# Patient Record
Sex: Female | Born: 1940
Health system: Southern US, Community
[De-identification: ages and names within clinical notes are randomized; demographics above are authoritative.]

## PROBLEM LIST (undated history)

## (undated) DIAGNOSIS — M810 Age-related osteoporosis without current pathological fracture: Secondary | ICD-10-CM

## (undated) DIAGNOSIS — I10 Essential (primary) hypertension: Secondary | ICD-10-CM

## (undated) DIAGNOSIS — F32A Depression, unspecified: Secondary | ICD-10-CM

## (undated) DIAGNOSIS — M1990 Unspecified osteoarthritis, unspecified site: Secondary | ICD-10-CM

## (undated) DIAGNOSIS — H409 Unspecified glaucoma: Secondary | ICD-10-CM

## (undated) DIAGNOSIS — M65312 Trigger thumb, left thumb: Secondary | ICD-10-CM

## (undated) DIAGNOSIS — M25511 Pain in right shoulder: Secondary | ICD-10-CM

## (undated) DIAGNOSIS — E785 Hyperlipidemia, unspecified: Secondary | ICD-10-CM

## (undated) DIAGNOSIS — R5382 Chronic fatigue, unspecified: Secondary | ICD-10-CM

## (undated) DIAGNOSIS — R252 Cramp and spasm: Secondary | ICD-10-CM

## (undated) DIAGNOSIS — R600 Localized edema: Secondary | ICD-10-CM

## (undated) DIAGNOSIS — H269 Unspecified cataract: Secondary | ICD-10-CM

## (undated) DIAGNOSIS — N189 Chronic kidney disease, unspecified: Secondary | ICD-10-CM

## (undated) DIAGNOSIS — F329 Major depressive disorder, single episode, unspecified: Secondary | ICD-10-CM

## (undated) DIAGNOSIS — R7303 Prediabetes: Secondary | ICD-10-CM

## (undated) DIAGNOSIS — G3184 Mild cognitive impairment, so stated: Secondary | ICD-10-CM

## (undated) DIAGNOSIS — A048 Other specified bacterial intestinal infections: Secondary | ICD-10-CM

## (undated) HISTORY — DX: Unspecified cataract: H26.9

## (undated) HISTORY — DX: Chronic fatigue, unspecified: R53.82

## (undated) HISTORY — DX: Depression, unspecified: F32.A

## (undated) HISTORY — DX: Chronic kidney disease, unspecified: N18.9

## (undated) HISTORY — DX: Hyperlipidemia, unspecified: E78.5

## (undated) HISTORY — DX: Trigger thumb, left thumb: M65.312

## (undated) HISTORY — PX: ABDOMINAL HYSTERECTOMY: SHX81

## (undated) HISTORY — DX: Essential (primary) hypertension: I10

## (undated) HISTORY — PX: BLADDER SURGERY: SHX569

## (undated) HISTORY — DX: Major depressive disorder, single episode, unspecified: F32.9

## (undated) HISTORY — DX: Age-related osteoporosis without current pathological fracture: M81.0

## (undated) HISTORY — PX: COLON SURGERY: SHX602

## (undated) HISTORY — DX: Prediabetes: R73.03

## (undated) HISTORY — DX: Mild cognitive impairment, so stated: G31.84

## (undated) HISTORY — DX: Unspecified osteoarthritis, unspecified site: M19.90

## (undated) HISTORY — DX: Unspecified glaucoma: H40.9

## (undated) HISTORY — PX: OTHER SURGICAL HISTORY: SHX169

---
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
## 1996-10-07 ENCOUNTER — Encounter (INDEPENDENT_AMBULATORY_CARE_PROVIDER_SITE_OTHER): Payer: Self-pay | Admitting: *Deleted

## 1998-06-30 ENCOUNTER — Ambulatory Visit (HOSPITAL_COMMUNITY): Admission: RE | Admit: 1998-06-30 | Discharge: 1998-06-30 | Payer: Self-pay | Admitting: Infectious Diseases

## 1998-06-30 ENCOUNTER — Encounter: Payer: Self-pay | Admitting: Infectious Diseases

## 1998-09-19 ENCOUNTER — Encounter: Admission: RE | Admit: 1998-09-19 | Discharge: 1998-09-19 | Payer: Self-pay | Admitting: Family Medicine

## 1998-10-09 ENCOUNTER — Encounter: Admission: RE | Admit: 1998-10-09 | Discharge: 1998-10-09 | Payer: Self-pay | Admitting: Family Medicine

## 1998-11-14 ENCOUNTER — Encounter: Admission: RE | Admit: 1998-11-14 | Discharge: 1998-11-14 | Payer: Self-pay | Admitting: Family Medicine

## 1998-11-21 ENCOUNTER — Encounter: Admission: RE | Admit: 1998-11-21 | Discharge: 1998-11-21 | Payer: Self-pay | Admitting: Family Medicine

## 1999-01-21 ENCOUNTER — Encounter: Admission: RE | Admit: 1999-01-21 | Discharge: 1999-01-21 | Payer: Self-pay | Admitting: Family Medicine

## 1999-04-22 ENCOUNTER — Encounter: Admission: RE | Admit: 1999-04-22 | Discharge: 1999-04-22 | Payer: Self-pay | Admitting: Family Medicine

## 1999-05-25 ENCOUNTER — Encounter: Admission: RE | Admit: 1999-05-25 | Discharge: 1999-05-25 | Payer: Self-pay | Admitting: Family Medicine

## 1999-06-03 ENCOUNTER — Encounter: Admission: RE | Admit: 1999-06-03 | Discharge: 1999-06-03 | Payer: Self-pay | Admitting: Family Medicine

## 1999-06-12 ENCOUNTER — Encounter: Admission: RE | Admit: 1999-06-12 | Discharge: 1999-06-12 | Payer: Self-pay | Admitting: Family Medicine

## 1999-06-15 ENCOUNTER — Encounter: Admission: RE | Admit: 1999-06-15 | Discharge: 1999-06-15 | Payer: Self-pay | Admitting: Family Medicine

## 1999-07-13 ENCOUNTER — Encounter: Admission: RE | Admit: 1999-07-13 | Discharge: 1999-07-13 | Payer: Self-pay | Admitting: Family Medicine

## 1999-08-04 ENCOUNTER — Encounter: Admission: RE | Admit: 1999-08-04 | Discharge: 1999-08-04 | Payer: Self-pay | Admitting: Family Medicine

## 1999-08-18 ENCOUNTER — Encounter: Admission: RE | Admit: 1999-08-18 | Discharge: 1999-08-18 | Payer: Self-pay | Admitting: Sports Medicine

## 1999-09-02 ENCOUNTER — Encounter: Admission: RE | Admit: 1999-09-02 | Discharge: 1999-09-02 | Payer: Self-pay | Admitting: Family Medicine

## 1999-09-03 ENCOUNTER — Encounter: Payer: Self-pay | Admitting: Sports Medicine

## 1999-09-03 ENCOUNTER — Encounter: Admission: RE | Admit: 1999-09-03 | Discharge: 1999-09-03 | Payer: Self-pay | Admitting: Sports Medicine

## 1999-09-22 ENCOUNTER — Encounter: Admission: RE | Admit: 1999-09-22 | Discharge: 1999-09-22 | Payer: Self-pay | Admitting: Sports Medicine

## 1999-09-23 ENCOUNTER — Encounter: Admission: RE | Admit: 1999-09-23 | Discharge: 1999-09-23 | Payer: Self-pay | Admitting: Sports Medicine

## 1999-09-23 ENCOUNTER — Encounter: Payer: Self-pay | Admitting: Sports Medicine

## 1999-10-14 ENCOUNTER — Encounter: Payer: Self-pay | Admitting: *Deleted

## 1999-10-14 ENCOUNTER — Encounter: Admission: RE | Admit: 1999-10-14 | Discharge: 1999-10-14 | Payer: Self-pay | Admitting: *Deleted

## 2000-03-16 ENCOUNTER — Encounter: Admission: RE | Admit: 2000-03-16 | Discharge: 2000-03-16 | Payer: Self-pay | Admitting: *Deleted

## 2000-03-16 ENCOUNTER — Encounter: Admission: RE | Admit: 2000-03-16 | Discharge: 2000-03-16 | Payer: Self-pay | Admitting: Family Medicine

## 2000-03-16 ENCOUNTER — Encounter: Payer: Self-pay | Admitting: *Deleted

## 2000-04-01 ENCOUNTER — Encounter: Admission: RE | Admit: 2000-04-01 | Discharge: 2000-04-01 | Payer: Self-pay | Admitting: Orthopedic Surgery

## 2000-04-01 ENCOUNTER — Encounter: Payer: Self-pay | Admitting: Orthopedic Surgery

## 2000-04-24 ENCOUNTER — Emergency Department (HOSPITAL_COMMUNITY): Admission: EM | Admit: 2000-04-24 | Discharge: 2000-04-24 | Payer: Self-pay | Admitting: Emergency Medicine

## 2000-05-09 ENCOUNTER — Encounter: Admission: RE | Admit: 2000-05-09 | Discharge: 2000-05-09 | Payer: Self-pay | Admitting: Sports Medicine

## 2000-05-11 ENCOUNTER — Encounter: Payer: Self-pay | Admitting: *Deleted

## 2000-05-11 ENCOUNTER — Encounter: Admission: RE | Admit: 2000-05-11 | Discharge: 2000-05-11 | Payer: Self-pay | Admitting: *Deleted

## 2000-08-23 ENCOUNTER — Encounter: Admission: RE | Admit: 2000-08-23 | Discharge: 2000-08-23 | Payer: Self-pay | Admitting: Sports Medicine

## 2000-08-26 ENCOUNTER — Encounter: Admission: RE | Admit: 2000-08-26 | Discharge: 2000-08-26 | Payer: Self-pay | Admitting: Family Medicine

## 2000-09-01 ENCOUNTER — Encounter: Admission: RE | Admit: 2000-09-01 | Discharge: 2000-09-01 | Payer: Self-pay | Admitting: Family Medicine

## 2000-09-29 ENCOUNTER — Encounter: Admission: RE | Admit: 2000-09-29 | Discharge: 2000-09-29 | Payer: Self-pay | Admitting: Family Medicine

## 2000-11-25 ENCOUNTER — Encounter: Admission: RE | Admit: 2000-11-25 | Discharge: 2000-11-25 | Payer: Self-pay | Admitting: Family Medicine

## 2000-12-06 ENCOUNTER — Encounter: Payer: Self-pay | Admitting: Sports Medicine

## 2000-12-06 ENCOUNTER — Encounter: Admission: RE | Admit: 2000-12-06 | Discharge: 2000-12-06 | Payer: Self-pay | Admitting: Sports Medicine

## 2000-12-28 ENCOUNTER — Encounter: Admission: RE | Admit: 2000-12-28 | Discharge: 2000-12-28 | Payer: Self-pay | Admitting: Family Medicine

## 2001-01-03 ENCOUNTER — Encounter: Admission: RE | Admit: 2001-01-03 | Discharge: 2001-01-03 | Payer: Self-pay | Admitting: Sports Medicine

## 2001-02-02 ENCOUNTER — Encounter: Admission: RE | Admit: 2001-02-02 | Discharge: 2001-02-02 | Payer: Self-pay | Admitting: Family Medicine

## 2001-02-10 ENCOUNTER — Encounter: Admission: RE | Admit: 2001-02-10 | Discharge: 2001-02-10 | Payer: Self-pay | Admitting: Family Medicine

## 2001-03-14 ENCOUNTER — Encounter: Admission: RE | Admit: 2001-03-14 | Discharge: 2001-03-14 | Payer: Self-pay | Admitting: Family Medicine

## 2001-04-11 ENCOUNTER — Encounter: Admission: RE | Admit: 2001-04-11 | Discharge: 2001-04-11 | Payer: Self-pay | Admitting: Sports Medicine

## 2001-04-24 ENCOUNTER — Encounter: Admission: RE | Admit: 2001-04-24 | Discharge: 2001-04-24 | Payer: Self-pay | Admitting: Family Medicine

## 2001-05-08 ENCOUNTER — Encounter: Admission: RE | Admit: 2001-05-08 | Discharge: 2001-05-08 | Payer: Self-pay | Admitting: Family Medicine

## 2001-05-25 ENCOUNTER — Encounter: Admission: RE | Admit: 2001-05-25 | Discharge: 2001-05-25 | Payer: Self-pay | Admitting: Family Medicine

## 2001-06-07 ENCOUNTER — Encounter: Admission: RE | Admit: 2001-06-07 | Discharge: 2001-06-07 | Payer: Self-pay | Admitting: Family Medicine

## 2001-06-27 ENCOUNTER — Encounter: Admission: RE | Admit: 2001-06-27 | Discharge: 2001-06-27 | Payer: Self-pay | Admitting: Family Medicine

## 2001-07-27 ENCOUNTER — Encounter: Admission: RE | Admit: 2001-07-27 | Discharge: 2001-07-27 | Payer: Self-pay | Admitting: Family Medicine

## 2001-08-28 ENCOUNTER — Encounter: Admission: RE | Admit: 2001-08-28 | Discharge: 2001-08-28 | Payer: Self-pay | Admitting: Family Medicine

## 2001-09-20 ENCOUNTER — Encounter: Admission: RE | Admit: 2001-09-20 | Discharge: 2001-09-20 | Payer: Self-pay | Admitting: Family Medicine

## 2001-10-02 ENCOUNTER — Encounter: Admission: RE | Admit: 2001-10-02 | Discharge: 2001-10-02 | Payer: Self-pay | Admitting: Family Medicine

## 2001-10-12 ENCOUNTER — Encounter: Admission: RE | Admit: 2001-10-12 | Discharge: 2001-10-12 | Payer: Self-pay | Admitting: Family Medicine

## 2001-10-20 ENCOUNTER — Encounter: Admission: RE | Admit: 2001-10-20 | Discharge: 2001-10-20 | Payer: Self-pay | Admitting: Family Medicine

## 2001-10-24 ENCOUNTER — Encounter: Payer: Self-pay | Admitting: Orthopedic Surgery

## 2001-10-26 ENCOUNTER — Ambulatory Visit (HOSPITAL_COMMUNITY): Admission: RE | Admit: 2001-10-26 | Discharge: 2001-10-26 | Payer: Self-pay | Admitting: Orthopedic Surgery

## 2001-11-20 ENCOUNTER — Encounter: Admission: RE | Admit: 2001-11-20 | Discharge: 2001-11-20 | Payer: Self-pay | Admitting: Family Medicine

## 2002-01-02 ENCOUNTER — Encounter: Admission: RE | Admit: 2002-01-02 | Discharge: 2002-01-02 | Payer: Self-pay | Admitting: Sports Medicine

## 2002-01-31 ENCOUNTER — Encounter: Admission: RE | Admit: 2002-01-31 | Discharge: 2002-01-31 | Payer: Self-pay | Admitting: Family Medicine

## 2002-04-05 ENCOUNTER — Encounter: Admission: RE | Admit: 2002-04-05 | Discharge: 2002-04-05 | Payer: Self-pay | Admitting: Family Medicine

## 2002-05-16 ENCOUNTER — Encounter: Admission: RE | Admit: 2002-05-16 | Discharge: 2002-05-16 | Payer: Self-pay | Admitting: Family Medicine

## 2002-05-30 ENCOUNTER — Encounter: Admission: RE | Admit: 2002-05-30 | Discharge: 2002-05-30 | Payer: Self-pay | Admitting: Family Medicine

## 2002-06-08 ENCOUNTER — Encounter: Admission: RE | Admit: 2002-06-08 | Discharge: 2002-06-08 | Payer: Self-pay | Admitting: Family Medicine

## 2002-06-12 ENCOUNTER — Encounter: Payer: Self-pay | Admitting: *Deleted

## 2002-06-12 ENCOUNTER — Encounter: Admission: RE | Admit: 2002-06-12 | Discharge: 2002-06-12 | Payer: Self-pay | Admitting: *Deleted

## 2002-06-18 ENCOUNTER — Encounter: Admission: RE | Admit: 2002-06-18 | Discharge: 2002-06-18 | Payer: Self-pay | Admitting: Family Medicine

## 2002-06-26 ENCOUNTER — Encounter: Admission: RE | Admit: 2002-06-26 | Discharge: 2002-09-24 | Payer: Self-pay | Admitting: Sports Medicine

## 2002-07-20 ENCOUNTER — Encounter: Admission: RE | Admit: 2002-07-20 | Discharge: 2002-07-20 | Payer: Self-pay | Admitting: Family Medicine

## 2002-09-18 ENCOUNTER — Encounter: Admission: RE | Admit: 2002-09-18 | Discharge: 2002-09-18 | Payer: Self-pay | Admitting: Family Medicine

## 2002-09-20 ENCOUNTER — Encounter: Admission: RE | Admit: 2002-09-20 | Discharge: 2002-09-20 | Payer: Self-pay | Admitting: Sports Medicine

## 2002-09-20 ENCOUNTER — Encounter: Payer: Self-pay | Admitting: Sports Medicine

## 2002-10-11 ENCOUNTER — Encounter: Admission: RE | Admit: 2002-10-11 | Discharge: 2002-10-11 | Payer: Self-pay | Admitting: Family Medicine

## 2002-10-23 ENCOUNTER — Encounter: Admission: RE | Admit: 2002-10-23 | Discharge: 2002-10-23 | Payer: Self-pay | Admitting: Family Medicine

## 2002-11-12 ENCOUNTER — Encounter: Admission: RE | Admit: 2002-11-12 | Discharge: 2002-11-12 | Payer: Self-pay | Admitting: Family Medicine

## 2002-11-15 ENCOUNTER — Encounter: Admission: RE | Admit: 2002-11-15 | Discharge: 2002-11-15 | Payer: Self-pay | Admitting: Family Medicine

## 2002-11-22 ENCOUNTER — Encounter: Admission: RE | Admit: 2002-11-22 | Discharge: 2002-11-22 | Payer: Self-pay | Admitting: Sports Medicine

## 2002-12-06 ENCOUNTER — Encounter: Admission: RE | Admit: 2002-12-06 | Discharge: 2002-12-06 | Payer: Self-pay | Admitting: Family Medicine

## 2002-12-06 ENCOUNTER — Ambulatory Visit (HOSPITAL_COMMUNITY): Admission: RE | Admit: 2002-12-06 | Discharge: 2002-12-06 | Payer: Self-pay | Admitting: Family Medicine

## 2002-12-19 ENCOUNTER — Encounter: Admission: RE | Admit: 2002-12-19 | Discharge: 2002-12-19 | Payer: Self-pay | Admitting: Sports Medicine

## 2002-12-19 ENCOUNTER — Encounter: Payer: Self-pay | Admitting: Sports Medicine

## 2002-12-19 ENCOUNTER — Encounter: Admission: RE | Admit: 2002-12-19 | Discharge: 2002-12-19 | Payer: Self-pay | Admitting: Family Medicine

## 2003-01-04 ENCOUNTER — Encounter: Admission: RE | Admit: 2003-01-04 | Discharge: 2003-01-04 | Payer: Self-pay | Admitting: Family Medicine

## 2003-01-21 ENCOUNTER — Encounter: Admission: RE | Admit: 2003-01-21 | Discharge: 2003-01-21 | Payer: Self-pay | Admitting: Family Medicine

## 2003-04-10 ENCOUNTER — Encounter: Admission: RE | Admit: 2003-04-10 | Discharge: 2003-04-10 | Payer: Self-pay | Admitting: Family Medicine

## 2003-05-20 ENCOUNTER — Encounter: Admission: RE | Admit: 2003-05-20 | Discharge: 2003-05-20 | Payer: Self-pay | Admitting: Family Medicine

## 2003-06-10 ENCOUNTER — Encounter: Admission: RE | Admit: 2003-06-10 | Discharge: 2003-06-10 | Payer: Self-pay | Admitting: Family Medicine

## 2003-10-23 ENCOUNTER — Encounter: Admission: RE | Admit: 2003-10-23 | Discharge: 2003-10-23 | Payer: Self-pay | Admitting: Family Medicine

## 2004-03-12 ENCOUNTER — Encounter: Admission: RE | Admit: 2004-03-12 | Discharge: 2004-03-12 | Payer: Self-pay | Admitting: Family Medicine

## 2004-08-20 ENCOUNTER — Ambulatory Visit: Payer: Self-pay | Admitting: Family Medicine

## 2004-12-24 ENCOUNTER — Ambulatory Visit: Payer: Self-pay | Admitting: Family Medicine

## 2005-06-03 ENCOUNTER — Ambulatory Visit: Payer: Self-pay | Admitting: Family Medicine

## 2005-06-17 ENCOUNTER — Ambulatory Visit: Payer: Self-pay | Admitting: Family Medicine

## 2005-06-29 ENCOUNTER — Ambulatory Visit (HOSPITAL_COMMUNITY): Admission: RE | Admit: 2005-06-29 | Discharge: 2005-06-29 | Payer: Self-pay | Admitting: Family Medicine

## 2005-08-18 ENCOUNTER — Ambulatory Visit: Payer: Self-pay | Admitting: Family Medicine

## 2005-09-06 ENCOUNTER — Ambulatory Visit: Payer: Self-pay | Admitting: Family Medicine

## 2005-09-16 ENCOUNTER — Ambulatory Visit: Payer: Self-pay | Admitting: Family Medicine

## 2005-10-18 ENCOUNTER — Ambulatory Visit: Payer: Self-pay | Admitting: Sports Medicine

## 2005-12-15 ENCOUNTER — Ambulatory Visit: Payer: Self-pay | Admitting: Sports Medicine

## 2006-01-14 ENCOUNTER — Ambulatory Visit: Payer: Self-pay | Admitting: Family Medicine

## 2006-05-03 ENCOUNTER — Ambulatory Visit: Payer: Self-pay | Admitting: Family Medicine

## 2006-06-14 ENCOUNTER — Ambulatory Visit: Payer: Self-pay | Admitting: Family Medicine

## 2006-06-28 ENCOUNTER — Ambulatory Visit: Payer: Self-pay

## 2006-07-05 ENCOUNTER — Ambulatory Visit (HOSPITAL_COMMUNITY): Admission: RE | Admit: 2006-07-05 | Discharge: 2006-07-05 | Payer: Self-pay | Admitting: Family Medicine

## 2006-08-05 ENCOUNTER — Ambulatory Visit: Payer: Self-pay | Admitting: Family Medicine

## 2006-10-06 DIAGNOSIS — F3289 Other specified depressive episodes: Secondary | ICD-10-CM | POA: Insufficient documentation

## 2006-10-06 DIAGNOSIS — I10 Essential (primary) hypertension: Secondary | ICD-10-CM | POA: Insufficient documentation

## 2006-10-06 DIAGNOSIS — N951 Menopausal and female climacteric states: Secondary | ICD-10-CM | POA: Insufficient documentation

## 2006-10-06 DIAGNOSIS — E669 Obesity, unspecified: Secondary | ICD-10-CM

## 2006-10-06 DIAGNOSIS — F329 Major depressive disorder, single episode, unspecified: Secondary | ICD-10-CM

## 2006-10-07 ENCOUNTER — Encounter (INDEPENDENT_AMBULATORY_CARE_PROVIDER_SITE_OTHER): Payer: Self-pay | Admitting: *Deleted

## 2006-11-17 ENCOUNTER — Ambulatory Visit: Payer: Self-pay | Admitting: Family Medicine

## 2006-11-17 DIAGNOSIS — E7800 Pure hypercholesterolemia, unspecified: Secondary | ICD-10-CM

## 2006-11-18 ENCOUNTER — Telehealth (INDEPENDENT_AMBULATORY_CARE_PROVIDER_SITE_OTHER): Payer: Self-pay | Admitting: Family Medicine

## 2007-01-18 ENCOUNTER — Telehealth (INDEPENDENT_AMBULATORY_CARE_PROVIDER_SITE_OTHER): Payer: Self-pay | Admitting: Family Medicine

## 2007-01-26 ENCOUNTER — Encounter: Payer: Self-pay | Admitting: Family Medicine

## 2007-02-01 ENCOUNTER — Telehealth (INDEPENDENT_AMBULATORY_CARE_PROVIDER_SITE_OTHER): Payer: Self-pay | Admitting: Family Medicine

## 2007-02-14 ENCOUNTER — Telehealth: Payer: Self-pay | Admitting: *Deleted

## 2007-06-09 ENCOUNTER — Encounter (INDEPENDENT_AMBULATORY_CARE_PROVIDER_SITE_OTHER): Payer: Self-pay | Admitting: *Deleted

## 2007-06-09 ENCOUNTER — Encounter (INDEPENDENT_AMBULATORY_CARE_PROVIDER_SITE_OTHER): Payer: Self-pay | Admitting: Family Medicine

## 2007-06-09 ENCOUNTER — Ambulatory Visit: Payer: Self-pay | Admitting: Family Medicine

## 2007-06-09 LAB — CONVERTED CEMR LAB
ALT: 13 units/L (ref 0–35)
CO2: 27 meq/L (ref 19–32)
Cholesterol: 161 mg/dL (ref 0–200)
Creatinine, Ser: 1.06 mg/dL (ref 0.40–1.20)
HCT: 38.1 % (ref 36.0–46.0)
MCV: 96.9 fL (ref 78.0–100.0)
Platelets: 305 10*3/uL (ref 150–400)
RDW: 15.1 % — ABNORMAL HIGH (ref 11.5–14.0)
Total Bilirubin: 0.4 mg/dL (ref 0.3–1.2)
Total CHOL/HDL Ratio: 3.4
VLDL: 19 mg/dL (ref 0–40)

## 2007-06-11 ENCOUNTER — Encounter (INDEPENDENT_AMBULATORY_CARE_PROVIDER_SITE_OTHER): Payer: Self-pay | Admitting: Family Medicine

## 2007-06-22 ENCOUNTER — Telehealth: Payer: Self-pay | Admitting: *Deleted

## 2007-06-30 ENCOUNTER — Telehealth: Payer: Self-pay | Admitting: *Deleted

## 2007-07-19 ENCOUNTER — Ambulatory Visit (HOSPITAL_COMMUNITY): Admission: RE | Admit: 2007-07-19 | Discharge: 2007-07-19 | Payer: Self-pay | Admitting: Advanced Practice Midwife

## 2007-08-17 ENCOUNTER — Ambulatory Visit: Payer: Self-pay | Admitting: Family Medicine

## 2007-08-30 ENCOUNTER — Telehealth: Payer: Self-pay | Admitting: *Deleted

## 2007-09-06 ENCOUNTER — Encounter: Payer: Self-pay | Admitting: *Deleted

## 2007-09-11 ENCOUNTER — Ambulatory Visit: Payer: Self-pay | Admitting: Family Medicine

## 2007-09-20 ENCOUNTER — Telehealth: Payer: Self-pay | Admitting: *Deleted

## 2007-11-15 ENCOUNTER — Ambulatory Visit: Payer: Self-pay | Admitting: Sports Medicine

## 2007-12-12 ENCOUNTER — Telehealth: Payer: Self-pay | Admitting: *Deleted

## 2008-03-08 ENCOUNTER — Ambulatory Visit: Payer: Self-pay | Admitting: Family Medicine

## 2008-03-12 ENCOUNTER — Telehealth (INDEPENDENT_AMBULATORY_CARE_PROVIDER_SITE_OTHER): Payer: Self-pay | Admitting: Family Medicine

## 2008-05-13 ENCOUNTER — Encounter (INDEPENDENT_AMBULATORY_CARE_PROVIDER_SITE_OTHER): Payer: Self-pay | Admitting: Family Medicine

## 2008-05-31 ENCOUNTER — Ambulatory Visit: Payer: Self-pay | Admitting: Family Medicine

## 2008-06-03 ENCOUNTER — Telehealth: Payer: Self-pay | Admitting: *Deleted

## 2008-06-03 ENCOUNTER — Telehealth (INDEPENDENT_AMBULATORY_CARE_PROVIDER_SITE_OTHER): Payer: Self-pay | Admitting: Family Medicine

## 2008-07-24 ENCOUNTER — Ambulatory Visit (HOSPITAL_COMMUNITY): Admission: RE | Admit: 2008-07-24 | Discharge: 2008-07-24 | Payer: Self-pay | Admitting: Family Medicine

## 2008-08-09 HISTORY — PX: OTHER SURGICAL HISTORY: SHX169

## 2008-12-05 ENCOUNTER — Encounter (INDEPENDENT_AMBULATORY_CARE_PROVIDER_SITE_OTHER): Payer: Self-pay | Admitting: Family Medicine

## 2008-12-05 ENCOUNTER — Ambulatory Visit: Payer: Self-pay | Admitting: Family Medicine

## 2008-12-05 LAB — CONVERTED CEMR LAB
Alkaline Phosphatase: 60 units/L (ref 39–117)
BUN: 17 mg/dL (ref 6–23)
Creatinine, Ser: 1.1 mg/dL (ref 0.40–1.20)
Glucose, Bld: 103 mg/dL — ABNORMAL HIGH (ref 70–99)
HDL: 43 mg/dL (ref >39)
LDL Cholesterol: 69 mg/dL (ref 0–99)
Sodium: 142 meq/L (ref 135–145)
Total Bilirubin: 0.2 mg/dL — ABNORMAL LOW (ref 0.3–1.2)
Total CHOL/HDL Ratio: 3.2
Total Protein: 7 g/dL (ref 6.0–8.3)
Triglycerides: 127 mg/dL (ref <150)
VLDL: 25 mg/dL (ref 0–40)

## 2008-12-09 ENCOUNTER — Encounter (INDEPENDENT_AMBULATORY_CARE_PROVIDER_SITE_OTHER): Payer: Self-pay | Admitting: Family Medicine

## 2009-01-27 ENCOUNTER — Telehealth (INDEPENDENT_AMBULATORY_CARE_PROVIDER_SITE_OTHER): Payer: Self-pay | Admitting: Family Medicine

## 2009-04-22 ENCOUNTER — Telehealth: Payer: Self-pay | Admitting: Family Medicine

## 2009-04-24 ENCOUNTER — Ambulatory Visit: Payer: Self-pay | Admitting: Family Medicine

## 2009-05-21 ENCOUNTER — Ambulatory Visit: Payer: Self-pay | Admitting: Family Medicine

## 2009-06-26 ENCOUNTER — Ambulatory Visit: Payer: Self-pay | Admitting: Family Medicine

## 2009-07-01 ENCOUNTER — Telehealth: Payer: Self-pay | Admitting: Family Medicine

## 2009-08-06 ENCOUNTER — Ambulatory Visit (HOSPITAL_COMMUNITY): Admission: RE | Admit: 2009-08-06 | Discharge: 2009-08-06 | Payer: Self-pay | Admitting: Family Medicine

## 2009-08-06 ENCOUNTER — Telehealth: Payer: Self-pay | Admitting: Family Medicine

## 2009-12-30 ENCOUNTER — Encounter: Payer: Self-pay | Admitting: Family Medicine

## 2009-12-30 ENCOUNTER — Ambulatory Visit: Payer: Self-pay | Admitting: Family Medicine

## 2009-12-30 LAB — CONVERTED CEMR LAB
AST: 24 units/L (ref 0–37)
Albumin: 4.4 g/dL (ref 3.5–5.2)
Alkaline Phosphatase: 60 units/L (ref 39–117)
Calcium: 10.1 mg/dL (ref 8.4–10.5)
Cholesterol: 139 mg/dL (ref 0–200)
Creatinine, Ser: 1.19 mg/dL (ref 0.40–1.20)
HDL: 48 mg/dL (ref >39)
Total Protein: 7.6 g/dL (ref 6.0–8.3)
Triglycerides: 72 mg/dL (ref <150)

## 2009-12-31 ENCOUNTER — Encounter: Payer: Self-pay | Admitting: Family Medicine

## 2010-02-04 ENCOUNTER — Encounter: Payer: Self-pay | Admitting: Family Medicine

## 2010-02-04 ENCOUNTER — Ambulatory Visit: Payer: Self-pay | Admitting: Family Medicine

## 2010-02-04 LAB — CONVERTED CEMR LAB
BUN: 22 mg/dL (ref 6–23)
Calcium: 10 mg/dL (ref 8.4–10.5)
Creatinine, Ser: 1.18 mg/dL (ref 0.40–1.20)

## 2010-02-06 ENCOUNTER — Encounter: Payer: Self-pay | Admitting: Family Medicine

## 2010-02-20 ENCOUNTER — Ambulatory Visit: Payer: Self-pay | Admitting: Family Medicine

## 2010-02-20 ENCOUNTER — Encounter: Payer: Self-pay | Admitting: Family Medicine

## 2010-02-27 ENCOUNTER — Telehealth: Payer: Self-pay | Admitting: Family Medicine

## 2010-03-05 ENCOUNTER — Telehealth: Payer: Self-pay | Admitting: Family Medicine

## 2010-03-12 ENCOUNTER — Ambulatory Visit: Payer: Self-pay | Admitting: Family Medicine

## 2010-06-03 ENCOUNTER — Ambulatory Visit: Payer: Self-pay | Admitting: Family Medicine

## 2010-08-07 ENCOUNTER — Ambulatory Visit (HOSPITAL_COMMUNITY)
Admission: RE | Admit: 2010-08-07 | Discharge: 2010-08-07 | Payer: Self-pay | Source: Home / Self Care | Attending: Family Medicine | Admitting: Family Medicine

## 2010-08-30 ENCOUNTER — Encounter: Payer: Self-pay | Admitting: Family Medicine

## 2010-09-01 ENCOUNTER — Encounter (INDEPENDENT_AMBULATORY_CARE_PROVIDER_SITE_OTHER): Payer: Self-pay | Admitting: *Deleted

## 2010-09-10 NOTE — Assessment & Plan Note (Signed)
Summary: shoulder pain/[place]/saunders   Vital Signs:  Patient profile:   70 year old female Height:      62.75 inches Weight:      167 pounds BMI:     29.93 BSA:     1.79 Temp:     98.2 degrees F Pulse rate:   65 / minute BP sitting:   168 / 53  Vitals Entered By: Jone Baseman CMA (March 12, 2010 4:13 PM)  Serial Vital Signs/Assessments:  Time      Position  BP       Pulse  Resp  Temp     By                     145/75                         Angelena Sole MD  CC: f/u BP Is Patient Diabetic? No Pain Assessment Patient in pain? no        CC:  f/u BP.  History of Present Illness: 1. Shoulder pain: - s/p injection into right shoulder 1 week ago - it is much better - pain is not completely gone but she denies any current pain (0/10) - She has full ROM again - She thinks that her shoulder pain is coming from how she is sleeping  2. BP: - Taking her medicines as prescribed - She does check her BP at home regularly.  Highest number was 146/84.  Other numbers average around 120/80  ROS: denies chest pain, shortness of breath  Habits & Providers  Alcohol-Tobacco-Diet     Tobacco Status: never  Current Medications (verified): 1)  Carvedilol 25 Mg Tabs (Carvedilol) .... Take 1 Tablet By Mouth Twice A Day 2)  Simvastatin 80 Mg Tabs (Simvastatin) .... 1/2 Tab By Mouth At Bedtime 3)  Tums Calcium For Life Bone 750 Mg Chew (Calcium Carbonate Antacid) .... Take 1 Tablet By Mouth Twice A Day 4)  Adult Aspirin Low Strength 81 Mg  Tbdp (Aspirin) .... One Daily 5)  Diovan Hct 160-12.5 Mg Tabs (Valsartan-Hydrochlorothiazide) .... One Tab By Mouth Twice A Day 6)  Tylenol 8 Hour 650 Mg Cr-Tabs (Acetaminophen) .... Take 1 Tab By Mouth Every 8 Hours 7)  Roxicodone 5 Mg Tabs (Oxycodone Hcl) .... Take 1 Tab By Mouth Every 6 Hours As Needed For Pain  Allergies: 1)  ! * Compazine 2)  ! Oxycodone Hcl (Oxycodone Hcl)  Past History:  Past Medical History: Reviewed history from  12/05/2008 and no changes required. previous L ankle fracture Vitamin D deficiency 11/03 (25-OH level 9) ABI-0.69 (mild/moderate obstruction) - 11/15/2002 BMD scan  T = -0.19 (r heel) - 12/08/1999  Physical Exam  General:  Vitals reviewed and rechecked.  alert and overweight-appearing.   no acute distress Lungs:  normal respiratory effort.  clear breath sounds Heart:  normal rate and regular rhythm.   Abdomen:  soft and non-tender.   Msk:  Right shoulder: - no deformity, no redness, warmth, or swelling - non tender - full ROM - 5/5 strength Extremities:  no lower extremity edema   Impression & Recommendations:  Problem # 1:  SHOULDER PAIN, RIGHT (ICD-719.41) Assessment Improved  D/C'ed oxycodone because of hives.  Continue Tylenol CR.  Her updated medication list for this problem includes:    Adult Aspirin Low Strength 81 Mg Tbdp (Aspirin) ..... One daily    Tylenol 8 Hour 650 Mg Cr-tabs (Acetaminophen) .Marland Kitchen... Take  1 tab by mouth every 8 hours    Roxicodone 5 Mg Tabs (Oxycodone hcl) .Marland Kitchen... Take 1 tab by mouth every 6 hours as needed for pain  Orders: [facility]- Est Level  3 (11914)  Problem # 2:  HYPERTENSION, BENIGN SYSTEMIC (ICD-401.1) Assessment: Unchanged  At goal most of the time at home.  Improved here on recheck.  Will not make any medication changes. Her updated medication list for this problem includes:    Carvedilol 25 Mg Tabs (Carvedilol) .Marland Kitchen... Take 1 tablet by mouth twice a day    Diovan Hct 160-12.5 Mg Tabs (Valsartan-hydrochlorothiazide) ..... One tab by mouth twice a day  Orders: [facility]- Est Level  3 (78295)  Complete Medication List: 1)  Carvedilol 25 Mg Tabs (Carvedilol) .... Take 1 tablet by mouth twice a day 2)  Simvastatin 80 Mg Tabs (Simvastatin) .... 1/2 tab by mouth at bedtime 3)  Tums Calcium For Life Bone 750 Mg Chew (Calcium carbonate antacid) .... Take 1 tablet by mouth twice a day 4)  Adult Aspirin Low Strength 81 Mg Tbdp (Aspirin) .... One daily 5)   Diovan Hct 160-12.5 Mg Tabs (Valsartan-hydrochlorothiazide) .... One tab by mouth twice a day 6)  Tylenol 8 Hour 650 Mg Cr-tabs (Acetaminophen) .... Take 1 tab by mouth every 8 hours 7)  Roxicodone 5 Mg Tabs (Oxycodone hcl) .... Take 1 tab by mouth every 6 hours as needed for pain Prescriptions: DIOVAN HCT 160-12.5 MG TABS (VALSARTAN-HYDROCHLOROTHIAZIDE) one tab by mouth twice a day  #180 x 2   Entered and Authorized by:   Angelena Sole MD   Signed by:   Angelena Sole MD on 03/12/2010   Method used:   [facility]        [facility] [phone]* (retail)       [address]       [place], [place]  [address]       Ph: [phone] or [phone]       Fax: [phone]   RxID:   [id number]

## 2010-09-10 NOTE — Progress Notes (Signed)
Summary: triage  Phone Note Call from Patient Call back at Home Phone [phone]   Caller: Patient Summary of Call: Pt has rash and itching thinks it could be the pain medication she is on. Initial call taken by: Clydell Hakim,  February 27, 2010 8:56 AM  Follow-up for Phone Call        recieved Cortisone inj in shoulder last Fri by dr Lelon Perla, also given Oxycodone for pain, C/O itching starting on Wed or Thur, today she is broken out in a red raised rash on her stomach.  Thinks it is the pain medication, not itching as bad but has not taken  the hydrocone  this am.  States that she put a cold wash cloth on abd and that has help the rash.   Pain has gotten better since injection, no c/o of resp  issues.  Is taking Tylenol 650mg .  Advised to not take Hydrocodone, cont with Tylenol, since she feels she will be alright with that, call back if pain increases or or has other issues, to go to ED if any resp issues start, voiced understanding, to PCP Follow-up by: Gladstone Pih,  February 27, 2010 8:59 AM

## 2010-09-10 NOTE — Assessment & Plan Note (Signed)
Summary: f/u bp,df   Vital Signs:  Patient profile:   70 year old female Height:      62.75 inches Weight:      172 pounds BMI:     30.82 BSA:     1.81 Temp:     98.9 degrees F Pulse rate:   63 / minute BP sitting:   183 / 72  Vitals Entered By: Jone Baseman CMA (Dec 30, 2009 4:03 PM)  Serial Vital Signs/Assessments:  Time      Position  BP       Pulse  Resp  Temp     By 4:42 PM             160/90                         Angelena Sole MD  CC: f/u BP Is Patient Diabetic? No Pain Assessment Patient in pain? no        CC:  f/u BP.  History of Present Illness: 1. HTN:  Pt has been taking and tolerating her medicines as prescribed.  She has been checking her blood pressure three times a day.  Once in the morning, in the afternoon, and in the evening.  The morning average is 145/80.  The afternoon is 130/80 and the evening is 135/80.  Overall her numbers are pretty good.  She tries to limit her salt intake.      ROS: denies headache, chest pain, shortness of breath  2. HLD: She is taking the Simvastatin as prescribed      ROS: denies muscle weakness  Habits & Providers  Alcohol-Tobacco-Diet     Tobacco Status: never  Current Medications (verified): 1)  Carvedilol 25 Mg Tabs (Carvedilol) .... Take 1 Tablet By Mouth Twice A Day 2)  Simvastatin 80 Mg Tabs (Simvastatin) .Marland Kitchen.. 1 By Mouth At Bedtime 3)  Tums Calcium For Life Bone 750 Mg Chew (Calcium Carbonate Antacid) .... Take 1 Tablet By Mouth Twice A Day 4)  Adult Aspirin Low Strength 81 Mg  Tbdp (Aspirin) .... One Daily 5)  Diovan Hct 160-12.5 Mg Tabs (Valsartan-Hydrochlorothiazide) .... One Tab By Mouth Twice A Day  Allergies: 1)  ! * Compazine  Past History:  Past Medical History: Reviewed history from 12/05/2008 and no changes required. previous L ankle fracture Vitamin D deficiency 11/03 (25-OH level 9) ABI-0.69 (mild/moderate obstruction) - 11/15/2002 BMD scan  T = -0.19 (r heel) - 12/08/1999  Physical  Exam  General:  Vitals reviewed and rechecked.  alert and overweight-appearing.   no acute distress Eyes:  vision grossly intact, pupils equal, pupils round, and pupils reactive to light.  Dysconjugate gaze Lungs:  normal respiratory effort.  clear breath sounds Heart:  normal rate and regular rhythm.   Abdomen:  soft and non-tender.   Extremities:  no lower extremity edema Psych:  not depressed appearing.     Impression & Recommendations:  Problem # 1:  HYPERTENSION, BENIGN SYSTEMIC (ICD-401.1) Assessment Deteriorated Will increase Diovan-HCT to two times a day.  Follow up in 6 weeks Her updated medication list for this problem includes:    Carvedilol 25 Mg Tabs (Carvedilol) .Marland Kitchen... Take 1 tablet by mouth twice a day    Diovan Hct 160-12.5 Mg Tabs (Valsartan-hydrochlorothiazide) ..... One tab by mouth twice a day  Orders: T-Basic Metabolic Panel [id number]) [facility]- Est Level  3 (09811)  Problem # 2:  HYPERCHOLESTEROLEMIA (ICD-272.0) Assessment: Unchanged check lipid profile today Her updated  medication list for this problem includes:    Simvastatin 80 Mg Tabs (Simvastatin) .Marland Kitchen... 1 by mouth at bedtime  Orders: T-Lipid Profile ([id number]) [facility]- Est Level  3 (29528)  Complete Medication List: 1)  Carvedilol 25 Mg Tabs (Carvedilol) .... Take 1 tablet by mouth twice a day 2)  Simvastatin 80 Mg Tabs (Simvastatin) .Marland Kitchen.. 1 by mouth at bedtime 3)  Tums Calcium For Life Bone 750 Mg Chew (Calcium carbonate antacid) .... Take 1 tablet by mouth twice a day 4)  Adult Aspirin Low Strength 81 Mg Tbdp (Aspirin) .... One daily 5)  Diovan Hct 160-12.5 Mg Tabs (Valsartan-hydrochlorothiazide) .... One tab by mouth twice a day  Other Orders: T-Hepatic Function [phone])  Patient Instructions: 1)  We will work on getting your blood pressure under a little bit better control 2)  I would like for you to start taking the Diovan-HCT twice a day.  You can take one of them right before bed to see  if that helps with your blood pressures in the morning 3)  I will get some blood work today including cholesterol, kidney, and liver tests.  I will let you know of the results and if we need to make any changes 4)  Keep checking your blood pressure at least once a day 5)  Please schedule a follow up appointment in 6-8 weeks Prescriptions: DIOVAN HCT 160-12.5 MG TABS (VALSARTAN-HYDROCHLOROTHIAZIDE) one tab by mouth twice a day  #60 x 3   Entered and Authorized by:   Angelena Sole MD   Signed by:   Angelena Sole MD on 12/30/2009   Method used:   [facility]        [facility] [phone]* (retail)       [address], [place]  [address]       Ph: [phone] or [phone]       Fax: [phone]   RxID:   [id number]   Prevention & Chronic Care Immunizations   Influenza vaccine: Fluvax MCR  (06/26/2009)   Influenza vaccine due: 06/08/2008    Tetanus booster: 04/09/2006: Done.   Tetanus booster due: 04/09/2016    Pneumococcal vaccine: Not documented    H. zoster vaccine: Not documented  Colorectal Screening   Hemoccult: Done.  (06/09/2006)   Hemoccult due: Not Indicated    Colonoscopy: Not documented  Other Screening   Pap smear: Specimen Adequacy: Satisfactory for evaluation.   Interpretation/Result:Negative for intraepithelial Lesion or Malignancy.     (08/13/2009)   Pap smear due: Not Indicated    Mammogram: ASSESSMENT: Negative - BI-RADS 1^MM DIGITAL SCREENING  (08/06/2009)   Mammogram due: 07/24/2009    DXA bone density scan: Not documented   Smoking status: never  (12/30/2009)  Lipids   Total Cholesterol: 137  (12/05/2008)   Lipid panel action/deferral: Lipid Panel ordered   LDL: 69  (12/05/2008)   LDL Direct: Not documented   HDL: 43  (12/05/2008)   Triglycerides: 127  (12/05/2008)    SGOT (AST): 16  (12/05/2008)   BMP action: Ordered   SGPT (ALT): 14  (12/05/2008)   Alkaline phosphatase: 60  (12/05/2008)   Total bilirubin:  0.2  (12/05/2008)    Lipid flowsheet reviewed?: Yes   Progress toward LDL goal: Unchanged  Hypertension   Last Blood Pressure: 183 / 72  (12/30/2009)   Serum creatinine: 1.10  (12/05/2008)   BMP action: Ordered   Serum potassium 4.0  (12/05/2008)    Hypertension  flowsheet reviewed?: Yes   Progress toward BP goal: Unchanged  Self-Management Support :   Personal Goals (by the next clinic visit) :      Personal blood pressure goal: 140/90  (04/24/2009)     Personal LDL goal: 130  (04/24/2009)    Hypertension self-management support: Not documented    Lipid self-management support: Not documented

## 2010-09-10 NOTE — Progress Notes (Signed)
Summary: triage  Phone Note Call from Patient Call back at Home Phone [phone]   Caller: Patient Summary of Call: Pt having right shoulder pain. Initial call taken by: Clydell Hakim,  March 05, 2010 4:29 PM  Follow-up for Phone Call        appt at 3pm tomorrow with Dr. Claudius Sis. she is going to see if her son can bring her. can only come in pms. she will call back if unable to come Follow-up by: Golden Circle RN,  March 05, 2010 4:45 PM

## 2010-09-10 NOTE — Assessment & Plan Note (Signed)
Summary: f/u,df   Vital Signs:  Patient profile:   70 year old female Height:      62.75 inches Weight:      170 pounds BMI:     30.46 BSA:     1.80 Temp:     98.6 degrees F Pulse rate:   77 / minute BP sitting:   140 / 70  Vitals Entered By: Jone Baseman CMA (February 04, 2010 11:18 AM) CC: f/u BP Is Patient Diabetic? No Pain Assessment Patient in pain? no        CC:  f/u BP.  History of Present Illness: 1. HTN: Pt is taking and tolerating her medicines as prescribed.  She is checking her blood pressure regularly everyday.  Her reading average  ~125/70.  She is also trying to watch how much salt she eats.      ROS: denies chest pain, shortness of breath, headache  2. HLD: She is taking and tolerating the Simvastatin as prescribed.  She brought in a note about how 80mg  of Simvastatin can be associated with more side effects and was wondering if we would be able to lower the dose      ROS: denies claudication  Habits & Providers  Alcohol-Tobacco-Diet     Tobacco Status: never  Current Medications (verified): 1)  Carvedilol 25 Mg Tabs (Carvedilol) .... Take 1 Tablet By Mouth Twice A Day 2)  Simvastatin 80 Mg Tabs (Simvastatin) .... 1/2 Tab By Mouth At Bedtime 3)  Tums Calcium For Life Bone 750 Mg Chew (Calcium Carbonate Antacid) .... Take 1 Tablet By Mouth Twice A Day 4)  Adult Aspirin Low Strength 81 Mg  Tbdp (Aspirin) .... One Daily 5)  Diovan Hct 160-12.5 Mg Tabs (Valsartan-Hydrochlorothiazide) .... One Tab By Mouth Twice A Day  Allergies: 1)  ! * Compazine  Past History:  Past Medical History: Reviewed history from 12/05/2008 and no changes required. previous L ankle fracture Vitamin D deficiency 11/03 (25-OH level 9) ABI-0.69 (mild/moderate obstruction) - 11/15/2002 BMD scan  T = -0.19 (r heel) - 12/08/1999  Social History: Reviewed history from 12/05/2008 and no changes required. 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  Many financial issues.   Physical Exam  General:  Vitals reviewed and rechecked.  alert and overweight-appearing.   no acute distress Eyes:  vision grossly intact, pupils equal, pupils round, and pupils reactive to light.  Dysconjugate gaze Lungs:  normal respiratory effort.  clear breath sounds Heart:  normal rate and regular rhythm.   Abdomen:  soft and non-tender.   Extremities:  no lower extremity edema Psych:  not depressed appearing.     Impression & Recommendations:  Problem # 1:  HYPERTENSION, BENIGN SYSTEMIC (ICD-401.1) Assessment Improved  At goal.  Will not make any medication changes.  Will recheck BMET today because of the increase in the Diovan HCT at last office visit. Her updated medication list for this problem includes:    Carvedilol 25 Mg Tabs (Carvedilol) .Marland Kitchen... Take 1 tablet by mouth twice a day    Diovan Hct 160-12.5 Mg Tabs (Valsartan-hydrochlorothiazide) ..... One tab by mouth twice a day  Orders: [facility]- Est Level  3 ([address]109)  Problem # 2:  HYPERCHOLESTEROLEMIA (ICD-272.0) Assessment: Unchanged  Her lipid panel is at goal.  Educated patient about dietary changes that can be made to help with cholesterol.  She would like to decrease the dose.  Will  change to 40 mg daily.  Will recheck lipids in 6 months. Her updated medication list for this problem includes:    Simvastatin 80 Mg Tabs (Simvastatin) .Marland Kitchen... 1/2 tab by mouth at bedtime  Orders: [facility]- Est Level  3 ([address]64)  Complete Medication List: 1)  Carvedilol 25 Mg Tabs (Carvedilol) .... Take 1 tablet by mouth twice a day 2)  Simvastatin 80 Mg Tabs (Simvastatin) .... 1/2 tab by mouth at bedtime 3)  Tums Calcium For Life Bone 750 Mg Chew (Calcium carbonate antacid) .... Take 1 tablet by mouth twice a day 4)  Adult Aspirin Low Strength 81 Mg Tbdp (Aspirin) .... One daily 5)  Diovan Hct 160-12.5 Mg Tabs (Valsartan-hydrochlorothiazide) .... One  tab by mouth twice a day  Other Orders: Hemoccult Cards (Take Home) (Hemoccult Cards) Basic Met-[facility] [phone])  Patient Instructions: 1)  You are doing a great job with your medicines 2)  We will cut back to 40 mg of the Simvastatin since your cholesterol levels are great. 3)  We will recheck your cholesterol in 6 months 4)  Important things to cut out of your diet to help with your cholesterol includes carbs (anything white, wheat, or sweet) and fats (fried foods, junk food, butter, oils) 5)  Your blood pressure is good.  We will not make any changes to your blood pressure medicines today. 6)  We are going to recheck your kidney function today to make sure that the medicines is not affecting it. 7)  I am also going to send you home with some stool cards.  If they are abnormal then we should probably move forward with the colonoscopy. 8)  Please schedule a follow up appointment with me in 6 months.  Sooner if needed.  Prevention & Chronic Care Immunizations   Influenza vaccine: Fluvax MCR  (06/26/2009)   Influenza vaccine due: 06/08/2008    Tetanus booster: 04/09/2006: Done.   Tetanus booster due: 04/09/2016    Pneumococcal vaccine: Not documented    H. zoster vaccine: Not documented  Colorectal Screening   Hemoccult: Done.  (06/09/2006)   Hemoccult action/deferral: Ordered  (02/04/2010)   Hemoccult due: Not Indicated    Colonoscopy: Not documented   Colonoscopy action/deferral: Refused  (02/04/2010)  Other Screening   Pap smear: Specimen Adequacy: Satisfactory for evaluation.   Interpretation/Result:Negative for intraepithelial Lesion or Malignancy.     (08/13/2009)   Pap smear due: Not Indicated    Mammogram: ASSESSMENT: Negative - BI-RADS 1^MM DIGITAL SCREENING  (08/06/2009)   Mammogram due: 07/24/2009    DXA bone density scan: Not documented   Smoking status: never  (02/04/2010)  Lipids   Total Cholesterol: 139  (12/30/2009)   Lipid panel action/deferral:  Lipid Panel ordered   LDL: 77  (12/30/2009)   LDL Direct: Not documented   HDL: 48  (12/30/2009)   Triglycerides: 72  (12/30/2009)    SGOT (AST): 24  (12/30/2009)   BMP action: Ordered   SGPT (ALT): 24  (12/30/2009)   Alkaline phosphatase: 60  (12/30/2009)   Total bilirubin: 0.3  (12/30/2009)    Lipid flowsheet reviewed?: Yes   Progress toward LDL goal: Unchanged  Hypertension   Last Blood Pressure: 140 / 70  (02/04/2010)   Serum creatinine: 1.19  (12/30/2009)   BMP action: Ordered   Serum potassium 3.9  (12/30/2009)  Self-Management Support :   Personal Goals (by the next clinic visit) :      Personal blood pressure goal: 140/90  (04/24/2009)     Personal  LDL goal: 130  (04/24/2009)    Hypertension self-management support: Not documented    Lipid self-management support: Not documented    Nursing Instructions: Provide Hemoccult cards with instructions (see order)   Appended Document: Hemoccult card results  Laboratory Results  Date/Time Received: February 11, 2010 Date/Time Reported: February 12, 2010 3:33 PM   Stool - Occult Blood Hemmoccult #1: negative Date: 02/04/2010 Hemoccult #2: negative Date: 02/05/2010 Hemoccult #3: positive Date: 02/06/2010 Comments: ...........test performed by...........Marland KitchenTerese Door, CMA   Called pt about lab results.  Discussed with pt that she had a positive stool card and that I recommend her to get a colonscopy.  She refused that and said that she didn't want to have that done.  I advised her that this could mean that she had colon cancer but she was still unwilling to move forward with the colonscopy.  Advised her that at the very least if she is not willing to get a colonoscopy then we should recheck her stool cards in 3-6 months.  She agreed to do this.  Angelena Sole MD  February 13, 2010 9:37 AM

## 2010-09-10 NOTE — Letter (Signed)
Summary: Generic Letter  [facility]  [address]   [place], [place] [address]   Phone: [phone]  Fax: [phone]    12/31/2009  Meredith Owens [address] [place], [place]  [address]  Dear Ms. Cuello,  Here is a copy of your lab results.  Everything looked good.  Your cholesterol is right where we want it to be.  I do want to keep an eye of your kidney function (creatinine) because it was at the upper limits of normal.  We should probably recheck it in 6 months.  Tests: (1) Basic Metabolic Panel (44010)   Sodium                    140 mEq/L                   135-145   Potassium                 3.9 mEq/L                   3.5-5.3   Chloride                  101 mEq/L                   96-112   CO2                       26 mEq/L                    19-32   Glucose                   90 mg/dL                    27-25   BUN                       23 mg/dL                    3-66   Creatinine                1.19 mg/dL                  0.40-1.20   Calcium                   10.1 mg/dL                  4.4-03.4  Tests: (2) Lipid Profile (74259)   Cholesterol               139 mg/dL                   5-638     ATP III Classification:           < 200        mg/dL        Desirable          200 - 239     mg/dL        Borderline High          >= 240        mg/dL        High         Triglyceride  72 mg/dL                    <528   HDL Cholesterol           48 mg/dL                    >41   Total Chol/HDL Ratio      2.9 Ratio  VLDL Cholesterol (Calc)                             14 mg/dL                    3-24  LDL Cholesterol (Calc)                             77 mg/dL                    4-01           Total Cholesterol/HDL Ratio:CHD Risk                            Coronary Heart Disease Risk Table                                            Men       Women              1/2 Average Risk              3.4        3.3                  Average  Risk              5.0        4.4              2 X Average Risk              9.6        7.1              3 X Average Risk             23.4       11.0     Use the calculated Patient Ratio above and the CHD Risk table      to determine the patient's CHD Risk.     ATP III Classification (LDL):           < 100        mg/dL         Optimal          100 - 129     mg/dL         Near or Above Optimal          130 - 159     mg/dL         Borderline High          160 - 189     mg/dL         High           > 190        mg/dL  Very High        Tests: (3) Liver Profile ([address])   Bilirubin, Total          0.3 mg/dL                   6.0-4.5   Bilirubin, Direct         <0.1 mg/dL                  4.0-9.8   Indirect Bilirubin        NOT CALC mg/dL              1.1-9.1   Alkaline Phosphatase      60 U/L                      39-117   AST/SGOT                  24 U/L                      0-37   ALT/SGPT                  24 U/L                      0-35   Total Protein             7.6 g/dL                    4.7-8.2   Albumin                   4.4 g/dL                    9.5-6.2     Sincerely,   Angelena Sole MD  Appended Document: Generic Letter mailed.

## 2010-09-10 NOTE — Letter (Signed)
Summary: Generic Letter  [facility]  [address]   [place], [place] [address]   Phone: [phone]  Fax: [phone]    02/06/2010  Meredith Owens [address] [place], [place]  [address]  Dear Ms. Meinecke,  Here is a copy of your lab results.  Everything looks good.  Your kidney function is still within the normal range.  Sodium                    142 mEq/L                   135-145   Potassium                 4.2 mEq/L                   3.5-5.3   Chloride                  102 mEq/L                   96-112   CO2                       28 mEq/L                    19-32   Glucose                   99 mg/dL                    57-84   BUN                       22 mg/dL                    6-96   Creatinine                1.18 mg/dL                  0.40-1.20   Calcium                   10.0 mg/dL                  2.9-52.8   Sincerely,   Angelena Sole MD  Appended Document: Generic Letter mailed

## 2010-09-10 NOTE — Assessment & Plan Note (Signed)
Summary: bp pressure,tcb   Vital Signs:  Patient profile:   70 year old female Height:      62.75 inches Weight:      165.50 pounds BMI:     29.66 BSA:     1.78 Temp:     98.1 degrees F Pulse rate:   63 / minute BP sitting:   131 / 76  Vitals Entered By: Jone Baseman CMA (June 03, 2010 10:16 AM) CC: BP check up Is Patient Diabetic? No Pain Assessment Patient in pain? no        CC:  BP check up.  History of Present Illness: 1. HTN:  Pt is taking and tolerating her medicines as prescribed.  She checks her blood pressure at home regularly.  It is usually  ~ 120/80.  ROS: denies vision changes, chest pain, shortness of breath    Of note:  Her youngest son is getting married next month  Habits & Providers  Alcohol-Tobacco-Diet     Alcohol drinks/day: 0     Tobacco Status: never  Current Medications (verified): 1)  Carvedilol 25 Mg Tabs (Carvedilol) .... Take 1 Tablet By Mouth Twice A Day 2)  Simvastatin 80 Mg Tabs (Simvastatin) .... 1/2 Tab By Mouth At Bedtime 3)  Tums Calcium For Life Bone 750 Mg Chew (Calcium Carbonate Antacid) .... Take 1 Tablet By Mouth Twice A Day 4)  Adult Aspirin Low Strength 81 Mg  Tbdp (Aspirin) .... One Daily 5)  Diovan Hct 160-12.5 Mg Tabs (Valsartan-Hydrochlorothiazide) .... One Tab By Mouth Twice A Day 6)  Tylenol 8 Hour 650 Mg Cr-Tabs (Acetaminophen) .... Take 1 Tab By Mouth Every 8 Hours  Allergies: 1)  ! * Compazine 2)  ! Oxycodone Hcl (Oxycodone Hcl)  Past History:  Past Medical History: HTN previous L ankle fracture Vitamin D deficiency 11/03 (25-OH level 9) ABI-0.69 (mild/moderate obstruction) - 11/15/2002 BMD scan  T = -0.19 (r heel) - 12/08/1999  Social History: Reviewed history from 02/04/2010 and no changes required. 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  Many financial issues.   Physical Exam  General:   Vitals reviewed.  alert and overweight-appearing.   no acute distress Eyes:  vision grossly intact, pupils equal, pupils round, and pupils reactive to light.  Dysconjugate gaze Neck:  full ROM.  no masses.   Lungs:  normal respiratory effort.  clear breath sounds Heart:  normal rate and regular rhythm.   Extremities:  no lower extremity edema   Impression & Recommendations:  Problem # 1:  HYPERTENSION, BENIGN SYSTEMIC (ICD-401.1) Assessment Improved  Doing well.  At goal.  Continue current medications. Her updated medication list for this problem includes:    Carvedilol 25 Mg Tabs (Carvedilol) .Marland Kitchen... Take 1 tablet by mouth twice a day    Diovan Hct 160-12.5 Mg Tabs (Valsartan-hydrochlorothiazide) ..... One tab by mouth twice a day  Orders: [facility]- Est Level  3 ([address])  Complete Medication List: 1)  Carvedilol 25 Mg Tabs (Carvedilol) .... Take 1 tablet by mouth twice a day 2)  Simvastatin 80 Mg Tabs (Simvastatin) .... 1/2 tab by mouth at bedtime 3)  Tums Calcium For Life Bone 750 Mg Chew (Calcium carbonate antacid) .... Take 1 tablet by mouth twice a day 4)  Adult Aspirin Low Strength 81 Mg Tbdp (Aspirin) .... One daily 5)  Diovan Hct 160-12.5 Mg Tabs (Valsartan-hydrochlorothiazide) .... One tab by mouth twice a day  6)  Tylenol 8 Hour 650 Mg Cr-tabs (Acetaminophen) .... Take 1 tab by mouth every 8 hours  Other Orders: Influenza Vaccine MCR (40981)  Patient Instructions: 1)  You are doing well.  Your blood pressure looks great 2)  Have fun at the wedding next month 3)  Please come back and see me in 3 months   Orders Added: 1)  Influenza Vaccine MCR [00025] 2)  [facility]- Est Level  3 [[address]]   Immunizations Administered:  Influenza Vaccine # 1:    Vaccine Type: Fluvax MCR    Site: right deltoid    Mfr: GlaxoSmithKline    Dose: 0.5 ml    Route: IM    Given by: Jone Baseman CMA    Exp. Date: 02/03/2011    Lot #: [id number]    VIS given: 03/03/10 version given June 03, 2010.  Flu Vaccine Consent Questions:    Do you have a history of severe allergic reactions to this vaccine? no    Any prior history of allergic reactions to egg and/or gelatin? no    Do you have a sensitivity to the preservative Thimersol? no    Do you have a past history of Guillan-Barre Syndrome? no    Do you currently have an acute febrile illness? no    Have you ever had a severe reaction to latex? no    Vaccine information given and explained to patient? yes    Are you currently pregnant? no   Immunizations Administered:  Influenza Vaccine # 1:    Vaccine Type: Fluvax MCR    Site: right deltoid    Mfr: GlaxoSmithKline    Dose: 0.5 ml    Route: IM    Given by: Jone Baseman CMA    Exp. Date: 02/03/2011    Lot #: [id number]    VIS given: 03/03/10 version given June 03, 2010.  Prevention & Chronic Care Immunizations   Influenza vaccine: Fluvax MCR  (06/03/2010)   Influenza vaccine due: 06/08/2008    Tetanus booster: 04/09/2006: Done.   Tetanus booster due: 04/09/2016    Pneumococcal vaccine: Not documented    H. zoster vaccine: Not documented  Colorectal Screening   Hemoccult: Done.  (06/09/2006)   Hemoccult action/deferral: Ordered  (02/04/2010)   Hemoccult due: Not Indicated    Colonoscopy: Not documented   Colonoscopy action/deferral: Refused  (02/04/2010)  Other Screening   Pap smear: Specimen Adequacy: Satisfactory for evaluation.   Interpretation/Result:Negative for intraepithelial Lesion or Malignancy.     (08/13/2009)   Pap smear due: Not Indicated    Mammogram: ASSESSMENT: Negative - BI-RADS 1^MM DIGITAL SCREENING  (08/06/2009)   Mammogram due: 07/24/2009    DXA bone density scan: Not documented   Smoking status: never  (06/03/2010)  Lipids   Total Cholesterol: 139  (12/30/2009)   Lipid panel action/deferral: Lipid Panel ordered   LDL: 77  (12/30/2009)   LDL Direct: Not documented   HDL: 48  (12/30/2009)   Triglycerides: 72   (12/30/2009)    SGOT (AST): 24  (12/30/2009)   BMP action: Ordered   SGPT (ALT): 24  (12/30/2009)   Alkaline phosphatase: 60  (12/30/2009)   Total bilirubin: 0.3  (12/30/2009)    Lipid flowsheet reviewed?: Yes   Progress toward LDL goal: Unchanged  Hypertension   Last Blood Pressure: 131 / 76  (06/03/2010)   Serum creatinine: 1.18  (02/04/2010)   BMP action: Ordered   Serum potassium 4.2  (02/04/2010)    Hypertension flowsheet reviewed?: Yes  Progress toward BP goal: Improved  Self-Management Support :   Personal Goals (by the next clinic visit) :      Personal blood pressure goal: 140/90  (04/24/2009)     Personal LDL goal: 130  (04/24/2009)    Hypertension self-management support: Not documented    Lipid self-management support: Not documented    Nursing Instructions: Give Flu vaccine today

## 2010-09-10 NOTE — Assessment & Plan Note (Signed)
Summary: arm pain/eo   Vital Signs:  Patient profile:   70 year old female Height:      62.75 inches Weight:      172.4 pounds BMI:     30.89 Temp:     98.3 degrees F oral Pulse rate:   61 / minute BP sitting:   172 / 69  (left arm) Cuff size:   regular  Vitals Entered By: Garen Grams LPN (February 20, 2010 11:32 AM) CC: arm pain x 1 week Is Patient Diabetic? No Pain Assessment Patient in pain? yes     Location: right arm Intensity: 9   CC:  arm pain x 1 week.  History of Present Illness: 1. right shoulder pain: - been there almost 2 weeks - constant pain - located in the right shoulder, diffusely - Rated an 8/10 - improved with tylenol - worse with any movement - no injury to the shoulder but was on a long bus trip where it was immobile and held in an akward position. PMHx: has a hx of right shoulder bursitis and this feels like that, has required steroid injection before ROS: denies neck pain, redness, swelling, or fevers  2. HTN: - taking her medicines as prescribed - checks her BP regularly averaging around 125/80 - has been a little higher recently because of pain ROS: denies chest pain, shortness of breath, headache  Habits & Providers  Alcohol-Tobacco-Diet     Tobacco Status: never  Current Medications (verified): 1)  Carvedilol 25 Mg Tabs (Carvedilol) .... Take 1 Tablet By Mouth Twice A Day 2)  Simvastatin 80 Mg Tabs (Simvastatin) .... 1/2 Tab By Mouth At Bedtime 3)  Tums Calcium For Life Bone 750 Mg Chew (Calcium Carbonate Antacid) .... Take 1 Tablet By Mouth Twice A Day 4)  Adult Aspirin Low Strength 81 Mg  Tbdp (Aspirin) .... One Daily 5)  Diovan Hct 160-12.5 Mg Tabs (Valsartan-Hydrochlorothiazide) .... One Tab By Mouth Twice A Day  Allergies: 1)  ! * Compazine  Past History:  Past Medical History: Reviewed history from 12/05/2008 and no changes required. previous L ankle fracture Vitamin D deficiency 11/03 (25-OH level 9) ABI-0.69  (mild/moderate obstruction) - 11/15/2002 BMD scan  T = -0.19 (r heel) - 12/08/1999  Social History: Reviewed history from 02/04/2010 and no changes required. 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   Physical Exam  General:  Vitals reviewed and rechecked.  alert and overweight-appearing.   no acute distress Neck:  full ROM.  non-tender Lungs:  normal respiratory effort.  clear breath sounds Heart:  normal rate and regular rhythm.   Abdomen:  soft and non-tender.   Msk:  Right shoulder: - no deformity, no redness, warmth, or swelling - TTP diffusely, worse over middle deltoid - decreased ROM 2/2 to pain (90 flexion, 90 abduction) - 5/5 strength Pulses:  R radial normal.     Impression & Recommendations:  Problem # 1:  SHOULDER PAIN, RIGHT (ICD-719.41) Assessment New  Likely bursitis.  Injected 3cc lidocaine / 1 cc kenalog.  Will treat pain with scheduled Tylenol and breakthrough with Oxycodone. Her updated medication list for this problem includes:    Adult Aspirin Low Strength 81 Mg Tbdp (Aspirin) ..... One daily    Tylenol 8 Hour 650 Mg Cr-tabs (Acetaminophen) .Marland Kitchen... Take 1 tab by mouth every 8 hours    Roxicodone 5 Mg Tabs (Oxycodone hcl) .Marland KitchenMarland KitchenMarland KitchenMarland Kitchen  Take 1 tab by mouth every 6 hours as needed for pain  Orders: [facility]- Est  Level 4 ([address]) Injection, intermediate joint - [facility] (57846)  Problem # 2:  HYPERTENSION, BENIGN SYSTEMIC (ICD-401.1) Assessment: Deteriorated  Worse because of her pain.  Looked at home log and it is well controlled at home.  Will not make any medication changes today.  Will reassess when pain is well controlled Her updated medication list for this problem includes:    Carvedilol 25 Mg Tabs (Carvedilol) .Marland Kitchen... Take 1 tablet by mouth twice a day    Diovan Hct 160-12.5 Mg Tabs (Valsartan-hydrochlorothiazide) ..... One tab by mouth twice a  day  Orders: [facility]- Est  Level 4 ([address])  Complete Medication List: 1)  Carvedilol 25 Mg Tabs (Carvedilol) .... Take 1 tablet by mouth twice a day 2)  Simvastatin 80 Mg Tabs (Simvastatin) .... 1/2 tab by mouth at bedtime 3)  Tums Calcium For Life Bone 750 Mg Chew (Calcium carbonate antacid) .... Take 1 tablet by mouth twice a day 4)  Adult Aspirin Low Strength 81 Mg Tbdp (Aspirin) .... One daily 5)  Diovan Hct 160-12.5 Mg Tabs (Valsartan-hydrochlorothiazide) .... One tab by mouth twice a day 6)  Tylenol 8 Hour 650 Mg Cr-tabs (Acetaminophen) .... Take 1 tab by mouth every 8 hours 7)  Roxicodone 5 Mg Tabs (Oxycodone hcl) .... Take 1 tab by mouth every 6 hours as needed for pain  Patient Instructions: 1)  I hope that the injection helps with your pain 2)  We will treat your pain with Tylenol and Oxycodone 3)  Take Tylenol every 8 hours 4)  The the Oxycodone every 6 hours as needed for pain 5)  Please schedule a follow up appointment with me in 2-3 weeks to check on your shoulder and recheck your blood pressure Prescriptions: TYLENOL 8 HOUR 650 MG CR-TABS (ACETAMINOPHEN) Take 1 tab by mouth every 8 hours  #40 x 0   Entered and Authorized by:   Angelena Sole MD   Signed by:   Angelena Sole MD on 02/20/2010   Method used:   Print then Give to Patient   RxID:   [id number] ROXICODONE 5 MG TABS (OXYCODONE HCL) Take 1 tab by mouth every 6 hours as needed for pain  #30 x 0   Entered and Authorized by:   Angelena Sole MD   Signed by:   Angelena Sole MD on 02/20/2010   Method used:   Print then Give to Patient   RxID:   [id number]

## 2010-09-10 NOTE — Miscellaneous (Signed)
Summary: Consent Shoulder Inj  Consent Shoulder Inj   Imported By: Clydell Hakim 03/12/2010 11:16:42  _____________________________________________________________________  External Attachment:    Type:   Image     Comment:   External Document

## 2010-09-10 NOTE — Letter (Signed)
Summary: Generic Letter  [facility]  [address]   [place], [place] [address]   Phone: [phone]  Fax: [phone]    09/01/2010  [address] [place], [place]  [address]  Dear Ms. Daoust,  We are happy to let you know that since you are covered under Medicare you are able to have a FREE visit at the [facility] to discuss your HEALTH. This is a new benefit for Medicare.  There will be no co-payment.  At this visit you will meet with Arlys John an expert in wellness and the health coach at our clinic.  At this visit we will discuss ways to keep you healthy and feeling well.  This visit will not replace your regular doctor visit and we cannot refill medications.     You will need to plan to be here at least one hour to talk about your medical history, your current status, review all of your medications, and discuss your future plans for your health.  This information will be entered into your record for your doctor to have and review.  If you are interested in staying healthy, this type of visit can help.  Please call the office at: [phone], to schedule a "Medicare Wellness Visit".  The day of the visit you should bring in all of your medications, including any vitamins, herbs, over the counter products you take.  Make a list of all the other doctors that you see, so we know who they are. If you have any other health documents please bring them.  We look forward to helping you stay healthy.  Sincerely,   [facility]  iAWV

## 2010-10-21 ENCOUNTER — Ambulatory Visit: Payer: Self-pay | Admitting: Family Medicine

## 2010-10-21 ENCOUNTER — Ambulatory Visit: Payer: Self-pay | Admitting: Home Health Services

## 2010-11-02 ENCOUNTER — Ambulatory Visit (INDEPENDENT_AMBULATORY_CARE_PROVIDER_SITE_OTHER): Payer: Medicare Other | Admitting: Family Medicine

## 2010-11-02 DIAGNOSIS — H43819 Vitreous degeneration, unspecified eye: Secondary | ICD-10-CM

## 2010-11-02 DIAGNOSIS — I10 Essential (primary) hypertension: Secondary | ICD-10-CM

## 2010-11-02 DIAGNOSIS — E7800 Pure hypercholesterolemia, unspecified: Secondary | ICD-10-CM

## 2010-11-02 MED ORDER — SIMVASTATIN 80 MG PO TABS: 40.0000 mg | ORAL_TABLET | Freq: Every day | ORAL | Status: DC

## 2010-11-02 MED ORDER — SIMVASTATIN 80 MG PO TABS
40.0000 mg | ORAL_TABLET | Freq: Every day | ORAL | Status: DC
Start: 2010-11-02 — End: 2011-12-15

## 2010-11-02 NOTE — Patient Instructions (Signed)
Your blood pressure is doing good today We will not make any changes to your blood pressure medications today We will check your cholesterol next visit so don't eat prior to that.  You can take the Diovan but I would hold off on the Carvedilol until after you eat. Please schedule a follow up appointment in 3 months

## 2010-11-02 NOTE — Assessment & Plan Note (Signed)
Followed by Opthalmology.  They advised her to f/u with them in 1 year unless new symptoms arise.

## 2010-11-02 NOTE — Progress Notes (Signed)
  Subjective:    Patient ID: Meredith Owens, female    DOB: 1941-04-26, 70 y.o.   MRN: [id number]  HPI 1. HTN:  Pt is taking her medications as prescribed.  She is checking her blood pressure at home regularly.  It is usually around 130/80 but has been up around 150/90 on a couple occassions.  2. Left eye vitreous detachment:  She was sitting in church when all of a sudden she felt like her left peripheral field of vision was covered with "a dark cloud".  She went to the eye doctor where they diagnosed her with a vitreous humor detachment.  They didn't recommend any further work up.  Her vision is actually about back to normal now.  However, now she is complaining of new floaters that weren't there before   Review of Systems Denies chest pain, shortness of breath.  Denies headaches, new vision problems    Objective:   Physical Exam  Constitutional: She is oriented to person, place, and time. She appears well-nourished. No distress.  Eyes:       Right eye: no vision (unchanged) Left eye: normal visual fields.  PERRL.  EOMI.  Normal fundoscopic exam.  Neck: Neck supple.  Cardiovascular: Normal rate, regular rhythm and normal heart sounds.   Pulmonary/Chest: Effort normal and breath sounds normal. No respiratory distress. She has no wheezes.  Musculoskeletal: She exhibits no edema.  Neurological: She is alert and oriented to person, place, and time. No cranial nerve deficit.          Assessment & Plan:

## 2010-11-02 NOTE — Assessment & Plan Note (Signed)
BP at goal. Continue current treatment 

## 2010-11-02 NOTE — Assessment & Plan Note (Signed)
Taking simvastatin 40mg  fine.  Will recheck fasting lipid panel at next visit.

## 2010-12-25 NOTE — Op Note (Signed)
[facility]  Patient:    Meredith Owens, Meredith Owens Visit Number: [id number] MRN: [id number]          Service Type: DSU Location: DAY Attending Physician:  Dominica Severin Dictated by:   Dominica Severin III, M.D. Proc. Date: 10/26/01 Admit Date:  10/26/2001                             Operative Report  DATE OF BIRTH:  Nov 18, 1940  PREOPERATIVE DIAGNOSIS:  Left carpal tunnel syndrome.  POSTOPERATIVE DIAGNOSIS:  Left carpal tunnel syndrome.  PROCEDURE: 1. Left median nerve field block at the wrist ______ purposes for a carpal    tunnel release. 2. Left limited open carpal tunnel release.  SURGEON:  Aron Baba, M.D.  ASSISTANT:  None.  COMPLICATIONS:  None.  ANESTHESIA:  Local with IV sedation. (The patient was kept awake, alert and oriented the entire case.)  TOURNIQUET TIME:  7 minutes.  INDICATIONS FOR PROCEDURE:  This patient is a very pleasant 70 year old black female who presents with the above-mentioned diagnoses. I have counselled her in regards to the risks and benefits of the surgery including the risk of infection, bleeding, anesthesia, damage to normal structures and failure of the surgery to accomplish its intended goals of relieving symptoms and restoring function. With this in mind, she desires to proceed.  OPERATIVE FINDINGS:  The patient had a very thickened transverse carpal ligament separately widely upon its incision. There were no specific _____ lesions in the canal; however, this was limited to open approach and full deep canal inspection was not carried out.  OPERATIVE PROCEDURE IN DETAIL:  The patient was seen by myself and anesthesia, taken to the operative suite. She was laid supine, appropriate padded and underwent very light IV sedation and was kept awake and following this underwent median nerve and field block administered by myself, the surgeon, with lidocaine and Marcaine without epinephrine and a small amount  of bicarbonate mixture. Once this was done, the arm was prepped and draped in the usual sterile fashion about the left upper extremity after a tourniquet was applied and latex precautions were adhered to. Once the sterile field was secured, the arm was elevated, tourniquet was insufflated to 250 mmHg and a 1 cm incision was made at the distal edge of the transverse carpal ligament, dissection was carried down through the palmar fascia and released along the length of the incision. Following this, the distal edge of the transverse carpal ligament was released under 4.0 loupe magnification, fat pad egressed nicely. Following this, distal to proximal dissection was carried out until adequate room was available for the canal preparation devices. The canal preparatory device 1, 2, and 3 were placed with patient awake, alert and oriented and without discomfort. The patient had this placed just under the proximal leaflet of the transverse carpal ligament. Following this, the obturator was placed in the security clip, security clip placed just under the proximal leaflet of the transverse carpal ligament, this slid nicely directly midline and the obturator disengaged indicating correct placement. Following this, the patient underwent placement of the security knife into the security blade effectively releasing the proximal leaflet of the transverse carpal ligament. She tolerated this well without difficulty. There were no complications. She then had the tourniquet deflated at 7 minutes, hemostasis obtained with the cautery and copious irrigation was applied followed by closure of the wound with interrupted 4-0 Prolene suture. A sterile compressive dressing  was placed without difficulty. The fingers had excellent refill and good sensation. She was taken to the recovery area and then discharged home. She was discharged on Vicodin and Robaxin. She will return to see me in 7 days, elevate the hand, move  her fingers frequently and notify me should any problems occur. It has been a pleasure to participate in her care and I look forward to participating in her postoperative recovery. Dictated by:   Dominica Severin III, M.D. Attending Physician:  Dominica Severin DD:  10/26/01 TD:  10/27/01 Job: [id number]

## 2010-12-29 ENCOUNTER — Other Ambulatory Visit: Payer: Self-pay | Admitting: Family Medicine

## 2010-12-29 NOTE — Telephone Encounter (Signed)
Refill request

## 2011-01-28 ENCOUNTER — Other Ambulatory Visit: Payer: Medicare Other

## 2011-01-28 DIAGNOSIS — E7800 Pure hypercholesterolemia, unspecified: Secondary | ICD-10-CM

## 2011-01-28 LAB — LIPID PANEL
LDL Cholesterol: 82 mg/dL (ref 0–99)
VLDL: 12 mg/dL (ref 0–40)

## 2011-01-28 NOTE — Progress Notes (Signed)
flp done today Meredith Owens 

## 2011-02-02 ENCOUNTER — Ambulatory Visit (INDEPENDENT_AMBULATORY_CARE_PROVIDER_SITE_OTHER): Payer: Medicare Other | Admitting: Family Medicine

## 2011-02-02 ENCOUNTER — Encounter: Payer: Self-pay | Admitting: Family Medicine

## 2011-02-02 VITALS — BP 151/65 | HR 70 | Temp 98.0°F | Wt 168.0 lb

## 2011-02-02 DIAGNOSIS — I10 Essential (primary) hypertension: Secondary | ICD-10-CM

## 2011-02-02 NOTE — Patient Instructions (Signed)
Keep up the good work of checking your blood pressure We will not make any medication changes today If the blood pressure starts to go up please let us know Schedule a follow up appointment in 3 months

## 2011-02-02 NOTE — Assessment & Plan Note (Signed)
BP is slightly elevated when she comes to clinic.  She does a good job of checking her blood pressure at home and it is always well controlled.  Will not make any changes today.

## 2011-02-02 NOTE — Progress Notes (Signed)
  Subjective:    Patient ID: Meredith Owens, female    DOB: 06-22-1941, 70 y.o.   MRN: [id number]  HPI 1. HTN:  She is taking her blood pressure medications as prescribed.  She is checking it about once a day.  It is averaging about 120/70.  It always goes up when she comes into the doctors office but is fine when she is at home.   Review of Systems Denies chest pain and shortness of breath.  Denies LE swelling    Objective:   Physical Exam  Constitutional: She is oriented to person, place, and time. She appears well-nourished. No distress.  Eyes:       Right eye: no vision (unchanged)  Neck: Neck supple.  Cardiovascular: Normal rate, regular rhythm and normal heart sounds.   Pulmonary/Chest: Effort normal and breath sounds normal. No respiratory distress. She has no wheezes.  Musculoskeletal: She exhibits no edema.  Neurological: She is alert and oriented to person, place, and time. No cranial nerve deficit.          Assessment & Plan:

## 2011-04-08 ENCOUNTER — Telehealth: Payer: Self-pay | Admitting: Family Medicine

## 2011-04-08 NOTE — Telephone Encounter (Signed)
Diovan HCt is on back order and she only has enough left to last her through the weekend. Pharmacist suggested she take half a pill bid but patient concerned she would not be getting the full dose. She says this medicine has been working for her and wants to know if new MD thinks she should start a new med since this is on back order. Message to MD

## 2011-04-08 NOTE — Telephone Encounter (Signed)
Meredith Owens would like to speak with someone about a change in one of her medications.  It is the Diovan HCTZ, and the pharmacist is telling her to divide them in a ways she doesn't understand.  She would likt to speak with someone about this.

## 2011-04-14 ENCOUNTER — Other Ambulatory Visit: Payer: Self-pay | Admitting: Family Medicine

## 2011-04-14 DIAGNOSIS — I10 Essential (primary) hypertension: Secondary | ICD-10-CM

## 2011-04-14 MED ORDER — VALSARTAN 160 MG PO TABS: 160.0000 mg | ORAL_TABLET | Freq: Every day | ORAL | Status: DC

## 2011-04-14 MED ORDER — HYDROCHLOROTHIAZIDE 12.5 MG PO TABS: 12.5000 mg | ORAL_TABLET | Freq: Every day | ORAL | Status: DC

## 2011-04-14 NOTE — Telephone Encounter (Signed)
Has this been addressed?

## 2011-04-14 NOTE — Telephone Encounter (Signed)
Meredith Owens is calling because she wanted to let Dr. Madolyn Frieze know that the Pharmacy was able to give her 1 bottle of the medication that was on back order so Dr. Madolyn Frieze didn't need to send in the Rx for the 2 medications.  I let the patient know that had already been done.  She said to thank Dr. Madolyn Frieze for her and let her know that she will be making an appt before the month is out.

## 2011-04-14 NOTE — Telephone Encounter (Signed)
No answer but left message informing her that I will be sending Rx for the two medication that Diovan HCT (a combination pill) is made up of. So instead of one pill, she will be taking 2 pills until she can get the Diovan HCT. Sending Rx to her pharmacy now.

## 2011-04-14 NOTE — Telephone Encounter (Signed)
To MD for FYI Mallory Schaad Dawn  

## 2011-05-21 ENCOUNTER — Encounter: Payer: Self-pay | Admitting: Family Medicine

## 2011-05-21 ENCOUNTER — Ambulatory Visit (INDEPENDENT_AMBULATORY_CARE_PROVIDER_SITE_OTHER): Payer: Medicare Other | Admitting: Family Medicine

## 2011-05-21 DIAGNOSIS — E7800 Pure hypercholesterolemia, unspecified: Secondary | ICD-10-CM

## 2011-05-21 DIAGNOSIS — H43819 Vitreous degeneration, unspecified eye: Secondary | ICD-10-CM

## 2011-05-21 DIAGNOSIS — M129 Arthropathy, unspecified: Secondary | ICD-10-CM

## 2011-05-21 DIAGNOSIS — I10 Essential (primary) hypertension: Secondary | ICD-10-CM

## 2011-05-21 DIAGNOSIS — F3289 Other specified depressive episodes: Secondary | ICD-10-CM

## 2011-05-21 DIAGNOSIS — F329 Major depressive disorder, single episode, unspecified: Secondary | ICD-10-CM

## 2011-05-21 DIAGNOSIS — Z23 Encounter for immunization: Secondary | ICD-10-CM

## 2011-05-21 NOTE — Assessment & Plan Note (Signed)
Depression following death of her husband from leukemia in 2008. Feels lonely sometimes now she lives alone but denies depression. Will monitor. Encouraged her to make an appointment if she feels this needs to be more fully addressed in the future.

## 2011-05-21 NOTE — Assessment & Plan Note (Addendum)
Well-controlled on simvastatin 40. Continue.

## 2011-05-21 NOTE — Assessment & Plan Note (Signed)
Symptoms may be gradually worsening. Do not think this is carpal tunnel syndrome, which patient was concerned about. She had carpal tunnel on left hand and required surgery. Symptoms consistent with arthritis. Patient does not like taking medications but recommended Tylenol and, if pain is severe, ibuprofen.

## 2011-05-21 NOTE — Patient Instructions (Signed)
It was so nice to meet you today.  I want you to enjoy food and time with your children, you may indulge occasionally, but be careful of your diet the other times.   Try the Tylenol 650 mg every 8 hours for the arthritis on your right hand. If the pain is severe, you may taken an ibuprofen 600 mg up to every 6 hours.  Come back and see me if this pain is getting worse.   If your mood is not good, then you are always welcome to talk to me about this.   Otherwise, follow-up with me in 6 months.

## 2011-05-21 NOTE — Assessment & Plan Note (Signed)
BP better on re-check but still on the higher side. Asymptomatic. BP usually higher here than at home. No changes to medications. Continue current.

## 2011-05-21 NOTE — Progress Notes (Signed)
  Subjective:    Patient ID: Meredith Owens, female    DOB: Apr 22, 1941, 70 y.o.   MRN: [id number]  HPI 70 YO AA F with history of hypercholesterolemia, hypertension, past depression, arthritis in R hand, R vitreous detachment. Here to get established with me, her new PCP.  1. Hypercholesterolemia Compliant with medications ROS: denies myalgias, RUQ pain  2. HTN Compliant with medications. Not as careful with her diet past few months. Has been indulging eating out and cooking with family.  ROS: denies headache, chest pain  3. Arthritis in R hand Feels this is getting worse Worse in morning and better with activity Does not like to take medications. Takes Tylenol occasionally, which helps Complains of occasional tingling over entire palmar aspect. Denies numbness or that tingling worse over first and second fingers.  ROS: denies swelling, fever  4. R vitreous detachment Wears glasses to read but otherwise able to see okay.  ROS: denies new changes in vision  5. Depression Denies currently but does feel lonesome sometimes (lives alone). But all her children live nearby.  ROS: denies hopelessness, changes in sleep/appetite, SI/HI  Review of Systems Per HPI    Objective:   Physical Exam Gen: NAD HEENT:   Eyes: R eye is artificial. L eye PERRL, EOMI Neck: no thyromegaly, LAD CV: RRR, no m/r/g Pulm: CTAB, no w/r/r Abd: NABS, soft, NT, ND Ext: no edema MSK:   R hand: no swelling, intact ROM, no numbness, no erythema, 2+ radial pulses     Assessment & Plan:

## 2011-05-21 NOTE — Assessment & Plan Note (Signed)
Stable. Follows up yearly with ophthalmologist.

## 2011-06-14 ENCOUNTER — Telehealth: Payer: Self-pay | Admitting: Family Medicine

## 2011-06-14 NOTE — Telephone Encounter (Signed)
Will forward to Dr. Oh Park   

## 2011-06-14 NOTE — Telephone Encounter (Signed)
Increased Tylenol to 650 mg (Tylenol arthritis) every 8 hours and this seems to provide more relief than lower dose of Tylenol.  Pain also gets better once starts moving her hand. Worse at night.   Recommended continuing to use the Tylenol arthritis and also ibuprofen 600 mg every 6 hours prn break through pain. If she finds herself needing ibuprofen for more than 1-2 weeks, advised her to make an appointment to get this evaluated.

## 2011-06-14 NOTE — Telephone Encounter (Signed)
Pt was seen last month and mentioned to MD about having pain in her hand and leg, thinks it is arthritis, wants to know if MD can go ahead an prescribe something without being seen?

## 2011-06-21 ENCOUNTER — Encounter: Payer: Self-pay | Admitting: Family Medicine

## 2011-06-21 ENCOUNTER — Ambulatory Visit (INDEPENDENT_AMBULATORY_CARE_PROVIDER_SITE_OTHER): Payer: Medicare Other | Admitting: Family Medicine

## 2011-06-21 DIAGNOSIS — M1710 Unilateral primary osteoarthritis, unspecified knee: Secondary | ICD-10-CM

## 2011-06-21 DIAGNOSIS — M129 Arthropathy, unspecified: Secondary | ICD-10-CM

## 2011-06-21 DIAGNOSIS — M25569 Pain in unspecified knee: Secondary | ICD-10-CM

## 2011-06-21 DIAGNOSIS — M25562 Pain in left knee: Secondary | ICD-10-CM

## 2011-06-21 DIAGNOSIS — M170 Bilateral primary osteoarthritis of knee: Secondary | ICD-10-CM

## 2011-06-21 NOTE — Patient Instructions (Signed)
I'm sorry you are hurting so badly.  You can try using Aspercreme both on you knees and your hand.  Please ice your knee tonight for 20 minutes, twice at least an hour apart.    You can keep taking ibuprofen or tylenol as needed for pain.  Try getting an exercise or squeeze ball for your hand to work on your hand strength.  Try to walk as much as your pain allows, move as much as you can.

## 2011-06-21 NOTE — Assessment & Plan Note (Signed)
Corticosteroid injection today.  Advised to [employer].   Advised to continue NSAIDS as needed, and try Aspercreme

## 2011-06-21 NOTE — Assessment & Plan Note (Signed)
Reviewed films from 2001, which showed bilateral OA.  Discussed cartilage damage as cause, and weight loss and exercise/staing active as part of conservative treatments. Offered PT referral, pt does not think she can afford to go at this point, but will remember it is an option.

## 2011-06-21 NOTE — Progress Notes (Signed)
  Subjective:    Patient ID: Meredith Owens, female    DOB: [birth year], 70 y.o.   MRN: [id number]  HPI  Meredith Owens comes in complaining of hand and knee pain.   Left Knee pain- she had imaging many years ago that showed arthritis.  She says it has come and gone for many years, but this time has been here for about one to two months.  She says she is trying to stay active and helps at church and her knee pain is really slowing her down.  She has taken some Ibuprofen as Dr. Madolyn Owens suggested, but she does not like taking medications and feels like she has a lot already.  She says no new injury of fall, and sometimes it feels a little swollen when she has been on it a long time.  She says she as recently been seeing the dentist several times, and sitting still in the reclining chair really causes her pain.   Hand pain- left hand this is fairly new over the past year.  She says she had carpel tunnel in the other hand but this feels different.  She says her hand hurts in the morning and is stiff for a few hours until she gets going.  She says it hurts to open jars or wring out a rag or washcloth.  She denies numbness, tingling, or shooting pains.   Review of Systems Pertinent items in HPI.     Objective:   Physical Exam  BP 152/80  Pulse 68  Temp(Src) 98.6 F (37 C) (Oral)  Ht 5' 2.75" (1.594 m)  Wt 166 lb (75.297 kg)  BMI 29.64 kg/m2 General appearance: alert, cooperative and no distress Eyes: EOMIT Hands: Strength and sensation intact.  Patient has pain with knuckle squeeze test in left hand.  Knees: No erythema or edema bilaterally.  Pt has some midline joint tenderness on left knee.  + crepitus in bilateral knees.  No laxity noted on ligamentous testing bilaterally.  McMurtry's negative bilaterally  Procedure: Left Knee Corticosteroid Injection:  Consent obtained and verified. Sterile betadine prep. Furthur cleansed with alcohol. Topical analgesic spray: Ethyl chloride. Joint:Left  knee Approached in typical fashion with:Lateral  Completed without difficulty Meds: 3cc 1% lidocaine, 1cc Kenolog Aftercare instructions and Red flags advised.     Assessment & Plan:

## 2011-06-21 NOTE — Assessment & Plan Note (Addendum)
Advised Aspercreme, getting a stress ball for hand exercises.  Again, pt declines PT at this time. Also advised to continue NSAIDS by mouth if needed.

## 2011-07-12 ENCOUNTER — Ambulatory Visit (INDEPENDENT_AMBULATORY_CARE_PROVIDER_SITE_OTHER): Payer: Medicare Other | Admitting: Family Medicine

## 2011-07-12 ENCOUNTER — Encounter: Payer: Self-pay | Admitting: Family Medicine

## 2011-07-12 VITALS — BP 154/69 | HR 65 | Temp 98.0°F | Ht 62.75 in | Wt 168.0 lb

## 2011-07-12 DIAGNOSIS — M1710 Unilateral primary osteoarthritis, unspecified knee: Secondary | ICD-10-CM

## 2011-07-12 DIAGNOSIS — M170 Bilateral primary osteoarthritis of knee: Secondary | ICD-10-CM

## 2011-07-12 NOTE — Assessment & Plan Note (Signed)
R>L.  Continue conservative rx at present.

## 2011-07-12 NOTE — Patient Instructions (Signed)
Wear and Tear Disorders of the Knee (Arthritis, Osteoarthritis) Everyone will experience wear and tear injuries (arthritis, osteoarthritis) of the knee. These are the changes we all get as we age. They come from the joint stress of daily living. The amount of cartilage damage in your knee and your symptoms determine if you need surgery. Mild problems require approximately two months recovery time. More severe problems take several months to recover. With mild problems, your surgeon may find worn and rough cartilage surfaces. With severe changes, your surgeon may find cartilage that has completely worn away and exposed the bone. Loose bodies of bone and cartilage, bone spurs (excess bone growth), and injuries to the menisci (cushions between the large bones of your leg) are also common. All of these problems can cause pain. For a mild wear and tear problem, rough cartilage may simply need to be shaved and smoothed. For more severe problems with areas of exposed bone, your surgeon may use an instrument for roughing up the bone surfaces to stimulate new cartilage growth. Loose bodies are usually removed. Torn menisci may be trimmed or repaired. ABOUT THE ARTHROSCOPIC PROCEDURE Arthroscopy is a surgical technique. It allows your orthopedic surgeon to diagnose and treat your knee injury with accuracy. The surgeon looks into your knee through a small scope. The scope is like a small (pencil-sized) telescope. Arthroscopy is less invasive than open knee surgery. You can expect a more rapid recovery. After the procedure, you will be moved to a recovery area until most of the effects of the medication have worn off. Your caregiver will discuss the test results with you. RECOVERY The severity of the arthritis and the type of procedure performed will determine recovery time. Other important factors include age, physical condition, medical conditions, and the type of rehabilitation program. Strengthening your muscles after  arthroscopy helps guarantee a better recovery. Follow your caregiver's instructions. Use crutches, rest, elevate, ice, and do knee exercises as instructed. Your caregivers will help you and instruct you with exercises and other physical therapy required to regain your mobility, muscle strength, and functioning following surgery. Only take over-the-counter or prescription medicines for pain, discomfort, or fever as directed by your caregiver.  SEEK MEDICAL CARE IF:   There is increased bleeding (more than a small spot) from the wound.   You notice redness, swelling, or increasing pain in the wound.   Pus is coming from wound.   You develop an unexplained oral temperature above 102 F (38.9 C) , or as your caregiver suggests.   You notice a foul smell coming from the wound or dressing.   You have severe pain with motion of the knee.  SEEK IMMEDIATE MEDICAL CARE IF:   You develop a rash.   You have difficulty breathing.   You have any allergic problems.  MAKE SURE YOU:   Understand these instructions.   Will watch your condition.   Will get help right away if you are not doing well or get worse.  Document Released: 07/23/2000 Document Revised: 04/07/2011 Document Reviewed: 12/20/2007 [facility] Patient Information 2012 [place], [place].Knee Pain The knee is the complex joint between your thigh and your lower leg. It is made up of bones, tendons, ligaments, and cartilage. The bones that make up the knee are:  The femur in the thigh.   The tibia and fibula in the lower leg.   The patella or kneecap riding in the groove on the lower femur.  CAUSES  Knee pain is a common complaint with  many causes. A few of these causes are:  Injury, such as:   A ruptured ligament or tendon injury.   Torn cartilage.   Medical conditions, such as:   Gout   Arthritis   Infections   Overuse, over training or overdoing a physical activity.  Knee pain can be minor or severe. Knee pain can  accompany debilitating injury. Minor knee problems often respond well to self-care measures or get well on their own. More serious injuries may need medical intervention or even surgery. SYMPTOMS The knee is complex. Symptoms of knee problems can vary widely. Some of the problems are:  Pain with movement and weight bearing.   Swelling and tenderness.   Buckling of the knee.   Inability to straighten or extend your knee.   Your knee locks and you cannot straighten it.   Warmth and redness with pain and fever.   Deformity or dislocation of the kneecap.  DIAGNOSIS  Determining what is wrong may be very straight forward such as when there is an injury. It can also be challenging because of the complexity of the knee. Tests to make a diagnosis may include:  Your caregiver taking a history and doing a physical exam.   Routine X-rays can be used to rule out other problems. X-rays will not reveal a cartilage tear. Some injuries of the knee can be diagnosed by:   Arthroscopy a surgical technique by which a small video camera is inserted through tiny incisions on the sides of the knee. This procedure is used to examine and repair internal knee joint problems. Tiny instruments can be used during arthroscopy to repair the torn knee cartilage (meniscus).   Arthrography is a radiology technique. A contrast liquid is directly injected into the knee joint. Internal structures of the knee joint then become visible on X-ray film.   An MRI scan is a non x-ray radiology procedure in which magnetic fields and a computer produce two- or three-dimensional images of the inside of the knee. Cartilage tears are often visible using an MRI scanner. MRI scans have largely replaced arthrography in diagnosing cartilage tears of the knee.   Blood work.   Examination of the fluid that helps to lubricate the knee joint (synovial fluid). This is done by taking a sample out using a needle and a syringe.  TREATMENT The  treatment of knee problems depends on the cause. Some of these treatments are:  Depending on the injury, proper casting, splinting, surgery or physical therapy care will be needed.   Give yourself adequate recovery time. Do not overuse your joints. If you begin to get sore during workout routines, back off. Slow down or do fewer repetitions.   For repetitive activities such as cycling or running, maintain your strength and nutrition.   Alternate muscle groups. For example if you are a weight lifter, work the upper body on one day and the lower body the next.   Either tight or weak muscles do not give the proper support for your knee. Tight or weak muscles do not absorb the stress placed on the knee joint. Keep the muscles surrounding the knee strong.   Take care of mechanical problems.   If you have flat feet, orthotics or special shoes may help. See your caregiver if you need help.   Arch supports, sometimes with wedges on the inner or outer aspect of the heel, can help. These can shift pressure away from the side of the knee most bothered by osteoarthritis.  A brace called an "unloader" brace also may be used to help ease the pressure on the most arthritic side of the knee.   If your caregiver has prescribed crutches, braces, wraps or ice, use as directed. The acronym for this is PRICE. This means protection, rest, ice, compression and elevation.   Nonsteroidal anti-inflammatory drugs (NSAID's), can help relieve pain. But if taken immediately after an injury, they may actually increase swelling. Take NSAID's with food in your stomach. Stop them if you develop stomach problems. Do not take these if you have a history of ulcers, stomach pain or bleeding from the bowel. Do not take without your caregiver's approval if you have problems with fluid retention, heart failure, or kidney problems.   For ongoing knee problems, physical therapy may be helpful.   Glucosamine and chondroitin are  over-the-counter dietary supplements. Both may help relieve the pain of osteoarthritis in the knee. These medicines are different from the usual anti-inflammatory drugs. Glucosamine may decrease the rate of cartilage destruction.   Injections of a corticosteroid drug into your knee joint may help reduce the symptoms of an arthritis flare-up. They may provide pain relief that lasts a few months. You may have to wait a few months between injections. The injections do have a small increased risk of infection, water retention and elevated blood sugar levels.   Hyaluronic acid injected into damaged joints may ease pain and provide lubrication. These injections may work by reducing inflammation. A series of shots may give relief for as long as 6 months.   Topical painkillers. Applying certain ointments to your skin may help relieve the pain and stiffness of osteoarthritis. Ask your pharmacist for suggestions. Many over the-counter products are approved for temporary relief of arthritis pain.   In some countries, doctors often prescribe topical NSAID's for relief of chronic conditions such as arthritis and tendinitis. A review of treatment with NSAID creams found that they worked as well as oral medications but without the serious side effects.  PREVENTION  Maintain a healthy weight. Extra pounds put more strain on your joints.   Get strong, stay limber. Weak muscles are a common cause of knee injuries. Stretching is important. Include flexibility exercises in your workouts.   Be smart about exercise. If you have osteoarthritis, chronic knee pain or recurring injuries, you may need to change the way you exercise. This does not mean you have to stop being active. If your knees ache after jogging or playing basketball, consider switching to swimming, water aerobics or other low-impact activities, at least for a few days a week. Sometimes limiting high-impact activities will provide relief.   Make sure your  shoes fit well. Choose footwear that is right for your sport.   Protect your knees. Use the proper gear for knee-sensitive activities. Use kneepads when playing volleyball or laying carpet. Buckle your seat belt every time you drive. Most shattered kneecaps occur in car accidents.   Rest when you are tired.  SEEK MEDICAL CARE IF:  You have knee pain that is continual and does not seem to be getting better.  SEEK IMMEDIATE MEDICAL CARE IF:  Your knee joint feels hot to the touch and you have a high fever. MAKE SURE YOU:   Understand these instructions.   Will watch your condition.   Will get help right away if you are not doing well or get worse.  Document Released: 05/23/2007 Document Revised: 04/07/2011 Document Reviewed: 05/23/2007 [facility] Patient Information 2012 [place], [place].

## 2011-07-12 NOTE — Progress Notes (Signed)
  Subjective:    Patient ID: Meredith Owens, female    DOB: 1941/02/18, 70 y.o.   MRN: [id number]  HPI Herer today after knee injection approx. 3 wks, ago.  She re-iterates that she is not ready for ortho referral, definitive rx, PT therapy secondary to cost.  She reports some relief with injection but continued stiffness and soreness.  Would like it re-injected if possible.  She has taken some NSAIDS, Ibuprofen at 200 mg q 6 hours which has not been very helpful.   Review of Systems  Constitutional: Positive for activity change (favoring right knee and now with pain on left.). Negative for fever and chills.  Respiratory: Negative for chest tightness and shortness of breath.   Gastrointestinal: Negative for abdominal pain.  Genitourinary: Negative for dysuria and difficulty urinating.  Musculoskeletal: Positive for joint swelling.  Skin: Negative for rash.  Neurological: Negative for headaches.  Psychiatric/Behavioral: Negative for confusion and agitation.       Objective:   Physical Exam  Vitals reviewed. Constitutional: She appears well-developed and well-nourished.  HENT:  Head: Normocephalic and atraumatic.  Abdominal: Soft. There is no tenderness.  Musculoskeletal: She exhibits edema (right knee anterior and medial.  No real effusion noted.) and tenderness (no joint line tenderness, stable joint.).  Skin: Skin is warm and dry.          Assessment & Plan:  Right knee osteoarthritis--continue conservative mgmt.  Continue NSAIDS at correct dosage.  Consider repeat injection of kneein 3-6 mos.  Continued education about normal process and eventual outcome.  Try heat or ice whichever makes the knee fell better.

## 2011-07-14 ENCOUNTER — Encounter: Payer: Self-pay | Admitting: Home Health Services

## 2011-07-14 ENCOUNTER — Ambulatory Visit (INDEPENDENT_AMBULATORY_CARE_PROVIDER_SITE_OTHER): Payer: Medicare Other | Admitting: Home Health Services

## 2011-07-14 VITALS — BP 131/67 | HR 64 | Temp 98.9°F | Ht 62.5 in | Wt 166.7 lb

## 2011-07-14 DIAGNOSIS — Z0000 Encounter for general adult medical examination without abnormal findings: Secondary | ICD-10-CM

## 2011-07-14 NOTE — Patient Instructions (Signed)
1. Continue to work on weight loss. 2. Focus on eating 4-5 fruits and vegetables a day. 3. Continue to serve others but also take care of your self. 4. Schedule your colonoscopy. 5. At next office appointment, get pneumonia vaccine. 6. Consider starting walking or chair exercises.

## 2011-07-14 NOTE — Progress Notes (Signed)
Patient here for annual wellness visit, patient reports: Risk Factors/Conditions needing evaluation or treatment: Pt does not have any risk factors that need evaluation. Home Safety: Pt lives by self in retirement community.  Pt reports having smoke detectors, adaptive equipment, and emergency pull in bathroom. Other Information: Corrective lens: Pt wears daily corrective lens, right eye removed, cataract surgery on left eye Dentures: Pt does not have dentures.  Pt at [facility] 2x year.  Memory: Pt reports some memory problems. Patient's Mini Mental Score (recorded in doc. flowsheet): 29  Balance/Gait: Pt has pain in right knee which impacts her gait.  Balance Abnormal Patient value  Sitting balance    Sit to stand    Attempts to arise    Immediate standing balance    Standing balance    Nudge    Eyes closed- Romberg    Tandem stance    Back lean    Neck Rotation    360 degree turn    Sitting down     Gait Abnormal Patient value  Initiation of gait    Step length-left    Step length-right    Step height-left    Step height-right x Knee pain  Step symmetry    Step continuity    Path deviation    Trunk movement x stiff  Walking stance        Annual Wellness Visit Requirements Recorded Today In  Medical, family, social history Past Medical, Family, Social History Section  Current providers Care team  Current medications Medications  Wt, BP, Ht, BMI Vital signs  Hearing assessment (welcome visit) Hearing/vision  Tobacco, alcohol, illicit drug use History  ADL Nurse Assessment  Depression Screening Nurse Assessment  Cognitive impairment Nurse Assessment  Mini Mental Status Document Flowsheet  Fall Risk Nurse Assessment  Home Safety Progress Note  End of Life Planning (welcome visit) Social Documentation  Medicare preventative services Progress Note  Risk factors/conditions needing evaluation/treatment Progress Note  Personalized health advice Patient Instructions,  goals, letter  Diet & Exercise Social Documentation  Emergency Contact Social Documentation  Seat Belts Social Documentation  Sun exposure/protection Social Documentation    Prevention Plan:   Recommended Medicare Prevention Screenings Women over 65 Test For Frequency Date of Last- BOLD if needed  Breast Cancer 1-2 yrs 12/11  Cervical Cancer 1-3 yrs hysterectomy  Colorectal Cancer 1-10 yrs recommended  Osteoporosis once Pt reported done  Cholesterol 5 yrs 6/12  Diabetes yearly 6/11  HIV yearly declined  Influenza Shot yearly 10/12  Pneumonia Shot once recommended  Zostavax Shot once recommended

## 2011-07-16 ENCOUNTER — Other Ambulatory Visit: Payer: Self-pay | Admitting: Family Medicine

## 2011-07-16 DIAGNOSIS — Z1231 Encounter for screening mammogram for malignant neoplasm of breast: Secondary | ICD-10-CM

## 2011-07-22 ENCOUNTER — Telehealth: Payer: Self-pay | Admitting: Family Medicine

## 2011-07-22 NOTE — Telephone Encounter (Signed)
Will forward to MD for referral. 

## 2011-07-22 NOTE — Telephone Encounter (Signed)
Meredith Owens, please schedule for colonoscopy.

## 2011-07-22 NOTE — Telephone Encounter (Signed)
Would like referral for colonoscopy. 

## 2011-07-26 NOTE — Telephone Encounter (Signed)
Called patient and gave her the number to [facility] to schedule this herself.

## 2011-07-28 NOTE — Telephone Encounter (Signed)
Pt says they require a referral from [place].

## 2011-07-29 ENCOUNTER — Encounter: Payer: Self-pay | Admitting: Gastroenterology

## 2011-07-29 NOTE — Telephone Encounter (Signed)
Appointment scheduled at [facility] for 08/12/11 at 8am, for consultation and 08/23/11 at 130pm for procedure. Patient informed.

## 2011-07-30 NOTE — Progress Notes (Signed)
  Subjective:    Patient ID: Meredith Owens, female    DOB: 1940/11/26, 70 y.o.   MRN: [id number]  HPI    Review of Systems     Objective:   Physical Exam        Assessment & Plan:  I have reviewed this visit with Arnetha Massy and agree with her notes and examination.

## 2011-08-09 ENCOUNTER — Other Ambulatory Visit: Payer: Self-pay | Admitting: Family Medicine

## 2011-08-09 NOTE — Telephone Encounter (Signed)
Refill request

## 2011-08-18 ENCOUNTER — Ambulatory Visit (AMBULATORY_SURGERY_CENTER): Payer: Medicare Other | Admitting: *Deleted

## 2011-08-18 VITALS — Ht 61.5 in | Wt 168.2 lb

## 2011-08-18 DIAGNOSIS — Z1211 Encounter for screening for malignant neoplasm of colon: Secondary | ICD-10-CM

## 2011-08-18 MED ORDER — PEG-KCL-NACL-NASULF-NA ASC-C 100 G PO SOLR: ORAL | Status: DC

## 2011-08-23 ENCOUNTER — Other Ambulatory Visit: Payer: Medicare Other | Admitting: Gastroenterology

## 2011-08-23 ENCOUNTER — Ambulatory Visit (HOSPITAL_COMMUNITY)
Admission: RE | Admit: 2011-08-23 | Discharge: 2011-08-23 | Disposition: A | Discharge status: Home / Self Care | Payer: Medicare Other | Source: Ambulatory Visit | Attending: Family Medicine | Admitting: Family Medicine

## 2011-08-23 DIAGNOSIS — Z1231 Encounter for screening mammogram for malignant neoplasm of breast: Secondary | ICD-10-CM | POA: Insufficient documentation

## 2011-09-01 ENCOUNTER — Encounter: Payer: Self-pay | Admitting: Gastroenterology

## 2011-09-01 ENCOUNTER — Ambulatory Visit (AMBULATORY_SURGERY_CENTER): Payer: Medicare Other | Admitting: Gastroenterology

## 2011-09-01 DIAGNOSIS — Z1211 Encounter for screening for malignant neoplasm of colon: Secondary | ICD-10-CM

## 2011-09-01 DIAGNOSIS — D126 Benign neoplasm of colon, unspecified: Secondary | ICD-10-CM

## 2011-09-01 DIAGNOSIS — K5730 Diverticulosis of large intestine without perforation or abscess without bleeding: Secondary | ICD-10-CM

## 2011-09-01 MED ORDER — SODIUM CHLORIDE 0.9 % IV SOLN: 500.0000 mL | INTRAVENOUS | Status: DC

## 2011-09-01 NOTE — Progress Notes (Signed)
Patient did not experience any of the following events: a burn prior to discharge; a fall within the facility; wrong site/side/patient/procedure/implant event; or a hospital transfer or hospital admission upon discharge from the facility. (G8907) Patient did not have preoperative order for IV antibiotic SSI prophylaxis. (G8918)  

## 2011-09-01 NOTE — Op Note (Signed)
[facility] [address] [place], [place]  [address]  COLONOSCOPY PROCEDURE REPORT  PATIENT:  Meredith Owens, Meredith Owens  MR#:  [id number] BIRTHDATE:  January 09, 1941, 70 yrs. old  GENDER:  female ENDOSCOPIST:  Rachael Fee, MD REFERRING:  Lucianne Muss, MD PROCEDURE DATE:  09/01/2011 PROCEDURE:  Colonoscopy with snare polypectomy ASA CLASS:  Class II INDICATIONS:  Routine Risk Screening MEDICATIONS:   Fentanyl 50 mcg IV, These medications were titrated to patient response per physician's verbal order, Versed 6 mg IV  DESCRIPTION OF PROCEDURE:   After the risks benefits and alternatives of the procedure were thoroughly explained, informed consent was obtained.  Digital rectal exam was performed and revealed no rectal masses.   The LB [id number]-[id number] endoscope was introduced through the anus and advanced to the cecum, which was identified by both the appendix and ileocecal valve, without limitations.  The quality of the prep was good..  The instrument was then slowly withdrawn as the colon was fully examined. <<PROCEDUREIMAGES>> FINDINGS: Two polyps were removed and sent to pathology. One was sessile, serrated appearing, located in cecum, 11mm across, removed with cold snare, piecemeal and sent to pathology (jar 1). The other was sessile, heaped up, 1.6 cm across, located in ascending colon, removed with snare/cautery in piecmeal fashion and sent to pathology (jar 2) (see image3, image9, and image10). Mild diverticulosis was found in the sigmoid to descending colon segments (see image1).  This was otherwise a normal examination of the colon (see image4, image5, and image11).   Retroflexed views in the rectum revealed no abnormalities. COMPLICATIONS:  None ENDOSCOPIC IMPRESSION: 1) Two polyps, both removed (both in piecemeal fashion) and both sent to pathology 2) Mild diverticulosis in the sigmoid to descending colon segments 3) Otherwise normal examination  RECOMMENDATIONS: 1)  If the polyps removed today are proven to be adenomatous (pre-cancerous) polyps, you will need a colonoscopy in 6 months.  2) You will receive a letter within 1-2 weeks with the results of your biopsy as well as final recommendations. Please call my office if you have not received a letter after 3 weeks.  ______________________________ Rachael Fee, MD  n. eSIGNED:   Rachael Fee at 09/01/2011 10:49 AM  Alvina Filbert, [phone]

## 2011-09-01 NOTE — Progress Notes (Signed)
The pt tolerated the colonoscopy very well. maw 

## 2011-09-01 NOTE — Patient Instructions (Signed)
Green and blue discharge instructions reviewed with patient and care partner.  Impression/recommendations:  Polyps (handout given) Diverticulosis (handout given) High Fiber Diet (handout given)  Resume medications as you were taking them prior to your procedure.

## 2011-09-02 ENCOUNTER — Telehealth: Payer: Self-pay

## 2011-09-02 NOTE — Telephone Encounter (Signed)
Follow up Call- Patient questions:  Do you have a fever, pain , or abdominal swelling? no Pain Score  0 *  Have you tolerated food without any problems? yes  Have you been able to return to your normal activities? yes  Do you have any questions about your discharge instructions: Diet   no Medications  no Follow up visit  no  Do you have questions or concerns about your Care? no  Actions: * If pain score is 4 or above: No action needed, pain <4.  Per the pt every one was wonderful.  No problems. maw

## 2011-09-07 ENCOUNTER — Encounter: Payer: Self-pay | Admitting: Gastroenterology

## 2011-09-14 ENCOUNTER — Telehealth: Payer: Self-pay | Admitting: *Deleted

## 2011-09-14 NOTE — Telephone Encounter (Signed)
Message left on our voicemail from [facility] ([place]).  Patient is on Gen. Diovan HCT 160/12.5mg  BID.  Her insurance no longer covers this strength/dose.  Can increase strength and have patient take once a day.  Will route note to PCP and call pharmacy back.  Gaylene Brooks, RN

## 2011-09-17 ENCOUNTER — Other Ambulatory Visit: Payer: Self-pay | Admitting: Family Medicine

## 2011-09-17 NOTE — Telephone Encounter (Signed)
Pt is asking about her Diavan meds- will it be changed for her ins to pay?

## 2011-09-17 NOTE — Telephone Encounter (Signed)
Spoke with Trinna Post @ [facility].   Patient's insurance continues to cover Diovan but no longer covers #90 day supply.  Will only cover #30 days at a time.  Informed patient and she is agreeable to this.  Informed patient that rx is ready to pick up @ pharmacy.  Gaylene Brooks, RN

## 2011-09-17 NOTE — Telephone Encounter (Signed)
Refill request

## 2011-09-27 ENCOUNTER — Telehealth: Payer: Self-pay | Admitting: Family Medicine

## 2011-09-27 NOTE — Telephone Encounter (Signed)
Spoke with patient and she reports recieving a letter indicating that her insurance company may be wanting her to go to generic of diovan once her new 30-day rx runs out. She is going to call ins company and find out for sure what they want her to do and give [place] a call back later in the week.

## 2011-09-27 NOTE — Telephone Encounter (Signed)
Patient would like to speak to Dr. Madolyn Frieze about her Diovan.  She received a letter about the quantity changing and she would like some clarification.

## 2011-10-04 ENCOUNTER — Ambulatory Visit (INDEPENDENT_AMBULATORY_CARE_PROVIDER_SITE_OTHER): Payer: Medicare Other | Admitting: Family Medicine

## 2011-10-04 ENCOUNTER — Encounter: Payer: Self-pay | Admitting: Family Medicine

## 2011-10-04 VITALS — BP 132/72 | HR 64 | Ht 62.5 in | Wt 168.0 lb

## 2011-10-04 DIAGNOSIS — S86919A Strain of unspecified muscle(s) and tendon(s) at lower leg level, unspecified leg, initial encounter: Secondary | ICD-10-CM

## 2011-10-04 DIAGNOSIS — IMO0002 Reserved for concepts with insufficient information to code with codable children: Secondary | ICD-10-CM

## 2011-10-04 NOTE — Patient Instructions (Signed)
I want you to alternate taking 600mg  of Motrin (ibuprofen) and 500mg  of Tylenol.  Start by taking the motrin, then 3 hours later take the tylenol, then three hours later take the motrin followed by the tylenol in three hours.  For example:  6am - 600mg  Motrin 9am - 500mg  of Tylenol 12pm - 600mg  Motrin 3pm - 500mg  of Tylenol 6pm - 600mg  Motrin 12am - 500mg  Tylenol  I want you to do that for one full week.  Come back to see [place] if you aren't feeling better in a couple of days.

## 2011-10-12 ENCOUNTER — Ambulatory Visit (INDEPENDENT_AMBULATORY_CARE_PROVIDER_SITE_OTHER): Payer: Medicare Other | Admitting: Family Medicine

## 2011-10-12 ENCOUNTER — Encounter: Payer: Self-pay | Admitting: Family Medicine

## 2011-10-12 DIAGNOSIS — K635 Polyp of colon: Secondary | ICD-10-CM | POA: Insufficient documentation

## 2011-10-12 DIAGNOSIS — D126 Benign neoplasm of colon, unspecified: Secondary | ICD-10-CM

## 2011-10-12 DIAGNOSIS — M129 Arthropathy, unspecified: Secondary | ICD-10-CM

## 2011-10-12 DIAGNOSIS — I10 Essential (primary) hypertension: Secondary | ICD-10-CM

## 2011-10-12 LAB — BASIC METABOLIC PANEL
Chloride: 106 mEq/L (ref 96–112)
Glucose, Bld: 99 mg/dL (ref 70–99)
Potassium: 4 mEq/L (ref 3.5–5.3)
Sodium: 141 mEq/L (ref 135–145)

## 2011-10-12 LAB — LIPID PANEL
Cholesterol: 164 mg/dL (ref 0–200)
Total CHOL/HDL Ratio: 2.8 Ratio
VLDL: 15 mg/dL (ref 0–40)

## 2011-10-12 MED ORDER — LOSARTAN POTASSIUM-HCTZ 50-12.5 MG PO TABS: 1.0000 | ORAL_TABLET | Freq: Two times a day (BID) | ORAL | Status: DC

## 2011-10-12 MED ORDER — CARVEDILOL 25 MG PO TABS: 25.0000 mg | ORAL_TABLET | Freq: Two times a day (BID) | ORAL | Status: DC

## 2011-10-12 NOTE — Assessment & Plan Note (Signed)
Currently in bilateral knees. Alleviated by Tylenol/ibuprofen regimen recommended by Dr. Louanne Belton for recent visit for arthritis flare. Continue prn. Follow-up in 2-4 weeks. Offered steroid injection and patient may consider at that time if pain not improved. Last received steroid injection 06/2011 in left knee.

## 2011-10-12 NOTE — Progress Notes (Signed)
  Subjective:    Patient ID: Meredith Owens, female    DOB: Oct 24, 1940, 71 y.o.   MRN: [id number]  HPI Follow-up: 1. HTN Home BP: 130-140/60 Compliant with medications. ROS: denies chest pain; occasional tension headache; no leg swelling; no difficulty breathing  2. Arthritis Seen recently for arthritis exacerbation in her calves Did well on alternating Tylenol/ibuprofen regimen Finished regimen yesterday at midnight Concerned the pain will not be controlled now that she is off this regimen No pain in calves now  ROS: mild tenderness in knees now, worse when walking/moving; denies swelling  Review of Systems Per HPI    Objective:   Physical Exam Gen: NAD Psych: engaged, appropriate CV: RRR Pulm: CTAB without w/r/r, NI WOB Ext: no swelling Knees: mild TTP along patella bilaterally; no effusions, erythema, warmth; intact ROM bilaterally     Assessment & Plan:

## 2011-10-12 NOTE — Assessment & Plan Note (Signed)
Recommending follow-up colonoscopy in 6 months.

## 2011-10-12 NOTE — Assessment & Plan Note (Signed)
Consistently elevated in clinic.  Seems well controlled at home (has BP cuff) 130-140s/60s.  Insurance denying Diovan. Will change to Hyzaar.  Advised patient to monitor closely for week following initiation of Hyzaar and given parameters to return to clinic. Otherwise, follow-up in 3 months.

## 2011-10-12 NOTE — Patient Instructions (Signed)
I am changing you to a similar medication called Hyzaar.  Please monitor your blood pressures 2-3 times a week. Let me know if your blood pressure is less than 120 or greater than 150 on the top (systolic).   If your blood pressures are stable, follow-up in 3 months for your blood pressure.  Try alternating Tylenol and ibuprofen as needed for your pain.  If you are using these medications consistently for more than 2 weeks, please make a follow-up appointment. We can also consider a steroid injection.   It was nice to see you today.

## 2011-10-12 NOTE — Progress Notes (Signed)
Subjective: The patient is a 72 y.o. year old female who presents today for leg pain. The patient reports that she has had intermittent problems with pain in her calf is, right greater than left, for a long time. This seems to be exacerbated by long periods of time either standing or walking. 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 Since that time she has had significant pain in her right calf. The pain is made worse by walking or standing. She has not tried taking any additional medicine for this. She has not noticed any swelling of her legs. She reports that when she has had problems like this in the past she has taking Motrin and Tylenol to good effect.  Objective:  Filed Vitals:   10/04/11 1612  BP: 132/72  Pulse: 64   Gen: NAD CV: RRR Resp: CTABL Ext: No difference in leg size.  No pain on palpation if calves bilaterally.  No ropes/chords palpated.  Good pulses bilaterally.  Assessment/Plan: Muscle strain: treat conservatively with motrin/tylenol.  RTC if not better in 1 week.  Please also see individual problems in problem list for problem-specific plans.

## 2011-10-13 ENCOUNTER — Encounter: Payer: Self-pay | Admitting: Family Medicine

## 2011-10-20 ENCOUNTER — Telehealth: Payer: Self-pay | Admitting: Family Medicine

## 2011-10-20 NOTE — Telephone Encounter (Signed)
Patient was reading  about side effects of Lorstartan-HCTZ and it mentions muscle cramps and discomfort.  She has these always and just wonders if the Diovan that she has been on for 2 years  would have anything to do with this. She just wants to be sure before she starts this new medicine. Advised will send message to Dr. Madolyn Frieze .

## 2011-10-20 NOTE — Telephone Encounter (Signed)
Concerned about side effects of muscle cramps, which patient has occasionally. Unsure if this is related to her medication. I told her that it is not a common reaction with this medication unless with electrolyte abnormalities. BMET 1 week ago was WNL, including K 4.0.  Patient is amenable to trying medication and seeing how she feels. Will consider changing medication if patient continues to feel concerned about side effects.  Also advised patient to make appointment if BP becomes not well controlled on new medication.  Patient seems well in tune with her medications and medical problems and expresses understanding.

## 2011-10-20 NOTE — Telephone Encounter (Signed)
Pt is going to be starting losartan-hydrochlorothiazide (HYZAAR) 50-12.5 MG next week and has a few questions about the side affects of this med.  Wants to talk to nurse about this first.

## 2011-10-22 ENCOUNTER — Ambulatory Visit: Payer: Medicare Other | Admitting: Family Medicine

## 2011-11-24 ENCOUNTER — Telehealth: Payer: Self-pay | Admitting: Family Medicine

## 2011-11-24 MED ORDER — LOSARTAN POTASSIUM-HCTZ 50-12.5 MG PO TABS: 1.0000 | ORAL_TABLET | Freq: Two times a day (BID) | ORAL | Status: DC

## 2011-11-24 NOTE — Telephone Encounter (Signed)
Received note from pharmacy saying patient did not pick-up refill.  Called patient who reports she was going to call this week and that she is compliant with Hyzaar and is tolerating it (no more muscle cramps).  Requesting 90 day supply. Rx sent to pharmacy.

## 2011-12-15 ENCOUNTER — Telehealth: Payer: Self-pay | Admitting: Family Medicine

## 2011-12-15 DIAGNOSIS — E7800 Pure hypercholesterolemia, unspecified: Secondary | ICD-10-CM

## 2011-12-15 MED ORDER — SIMVASTATIN 40 MG PO TABS: 40.0000 mg | ORAL_TABLET | Freq: Every day | ORAL | Status: DC

## 2011-12-15 NOTE — Telephone Encounter (Signed)
Please notify patient Rx has been sent.  Thank you.  

## 2011-12-15 NOTE — Telephone Encounter (Signed)
Patient has an appt in June, but is out of Zocor and needs a refill to last until her next appt.  She also would like it to be sent at 40 mg.  [facility] [address].

## 2012-01-17 ENCOUNTER — Ambulatory Visit: Payer: Medicare Other | Admitting: Family Medicine

## 2012-01-17 ENCOUNTER — Encounter: Payer: Self-pay | Admitting: Family Medicine

## 2012-01-17 VITALS — BP 116/65 | HR 71 | Temp 97.5°F | Ht 61.5 in | Wt 170.0 lb

## 2012-01-17 DIAGNOSIS — I10 Essential (primary) hypertension: Secondary | ICD-10-CM

## 2012-01-17 DIAGNOSIS — M129 Arthropathy, unspecified: Secondary | ICD-10-CM

## 2012-01-17 DIAGNOSIS — M170 Bilateral primary osteoarthritis of knee: Secondary | ICD-10-CM

## 2012-01-17 DIAGNOSIS — H43819 Vitreous degeneration, unspecified eye: Secondary | ICD-10-CM

## 2012-01-17 DIAGNOSIS — E7800 Pure hypercholesterolemia, unspecified: Secondary | ICD-10-CM

## 2012-01-17 MED ORDER — SIMVASTATIN 40 MG PO TABS: 40.0000 mg | ORAL_TABLET | Freq: Every day | ORAL | Status: DC

## 2012-01-17 MED ORDER — LOSARTAN POTASSIUM-HCTZ 50-12.5 MG PO TABS: 1.0000 | ORAL_TABLET | Freq: Two times a day (BID) | ORAL | Status: DC

## 2012-01-17 MED ORDER — CARVEDILOL 25 MG PO TABS: 25.0000 mg | ORAL_TABLET | Freq: Two times a day (BID) | ORAL | Status: DC

## 2012-01-17 MED ORDER — ASPIRIN 81 MG PO TABS: 81.0000 mg | ORAL_TABLET | Freq: Every day | ORAL | Status: DC

## 2012-01-17 NOTE — Assessment & Plan Note (Signed)
Stable.  Continue Coreg and Hyzaar.  Follow-up in 6 months.

## 2012-01-17 NOTE — Assessment & Plan Note (Signed)
Stable in Tylenol and ibuprofen prn.

## 2012-01-17 NOTE — Assessment & Plan Note (Signed)
Pain controlled with Tylenol and ibuprofen prn.  Follow-up as needed.

## 2012-01-17 NOTE — Assessment & Plan Note (Signed)
Documentation only. Patient followed by ophthalmologist for this and cataracts.

## 2012-01-17 NOTE — Progress Notes (Signed)
  Subjective:    Patient ID: Meredith Owens, female    DOB: 1940-09-03, 71 y.o.   MRN: [id number]  HPI 71 year old presenting for follow-up:  1. Hypertension Compliant with medications.  Home BP: 120-140s/60s usually. A few SBP 150-160s.   ROS:  Occasionally has a twinge on the right side of her chest. Noticed this about a few weeks ago. Happened once or twice. Not associated with activity, dyspnea, nausea. No chest pain today.  Denies difficulty breathing.  Denies headaches. Denies lightheadedness.   2. Knee pain, presumed osteoarthritis Stable. 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   3. Difficulty sleeping due to urinary frequency during nighttime. Gets up 2-3 times. She had a bladder tacking "a long time ago" for cystocele.  Denies dysuria or daytime frequency. Takes Hyzaar around 5 pm and she goes to bed around 10 pm.   Review of Systems Per HPI.  Past Medical History, Family History, Social History, Allergies, and Medications reviewed. Pertinent for grandmother who had a heart attack and a stroke; passed away at age 10.  No history of diabetes. Cholesterol is well controlled with simvastatin. Labs last checked 3 months ago.     Objective:   Physical Exam Gen: NAD; overweight PSYCH: pleasant, engaged, appropriate to questions, alert and oriented HEENT:    Prosthetic right eye from childhood incident  CV: RRR, normal S1/S2, no m/r/g PULM: NI WOB; CTAB without w/r/r ABD: soft, ND EXT: no edema SKIN: warm; no rash    Assessment & Plan:

## 2012-01-17 NOTE — Patient Instructions (Signed)
Please make a visit for a physical or to discuss urine problems if the problem worsens. Try to take your second dose of Hyzaar several hours before bedtime and avoid drinking a few hours before bedtime.   Follow-up in 6 months for your blood pressure.  It was nice to see you today.

## 2012-01-17 NOTE — Assessment & Plan Note (Signed)
Checked 3 months ago. Stable. LDL <100, HDL normal.  Follow-up in 9 months.  Continue simvastatin.

## 2012-02-04 ENCOUNTER — Encounter: Payer: Self-pay | Admitting: Gastroenterology

## 2012-05-09 ENCOUNTER — Other Ambulatory Visit: Payer: Self-pay | Admitting: Family Medicine

## 2012-05-17 ENCOUNTER — Other Ambulatory Visit: Payer: Self-pay | Admitting: *Deleted

## 2012-05-18 MED ORDER — LOSARTAN POTASSIUM-HCTZ 50-12.5 MG PO TABS: 1.0000 | ORAL_TABLET | Freq: Two times a day (BID) | ORAL | Status: DC

## 2012-05-29 ENCOUNTER — Ambulatory Visit: Payer: Medicare Other | Admitting: Family Medicine

## 2012-05-29 ENCOUNTER — Encounter: Payer: Self-pay | Admitting: Family Medicine

## 2012-05-29 VITALS — BP 154/74 | HR 71 | Temp 98.4°F | Ht 61.5 in | Wt 174.0 lb

## 2012-05-29 DIAGNOSIS — Z23 Encounter for immunization: Secondary | ICD-10-CM

## 2012-05-29 DIAGNOSIS — Z0000 Encounter for general adult medical examination without abnormal findings: Secondary | ICD-10-CM

## 2012-05-29 DIAGNOSIS — M170 Bilateral primary osteoarthritis of knee: Secondary | ICD-10-CM

## 2012-05-29 NOTE — Patient Instructions (Addendum)
Try to take calcium (600 mg twice a day at least)  Flu shot today  Follow-up in 6 months

## 2012-05-30 ENCOUNTER — Encounter (HOSPITAL_COMMUNITY): Payer: Self-pay | Admitting: Emergency Medicine

## 2012-05-30 ENCOUNTER — Emergency Department (HOSPITAL_COMMUNITY)
Admission: EM | Admit: 2012-05-30 | Discharge: 2012-05-30 | Disposition: A | Discharge status: Home / Self Care | Payer: No Typology Code available for payment source | Attending: Emergency Medicine | Admitting: Emergency Medicine

## 2012-05-30 DIAGNOSIS — S139XXA Sprain of joints and ligaments of unspecified parts of neck, initial encounter: Secondary | ICD-10-CM | POA: Insufficient documentation

## 2012-05-30 DIAGNOSIS — Y9389 Activity, other specified: Secondary | ICD-10-CM | POA: Insufficient documentation

## 2012-05-30 DIAGNOSIS — Z791 Long term (current) use of non-steroidal anti-inflammatories (NSAID): Secondary | ICD-10-CM | POA: Insufficient documentation

## 2012-05-30 DIAGNOSIS — E785 Hyperlipidemia, unspecified: Secondary | ICD-10-CM | POA: Insufficient documentation

## 2012-05-30 DIAGNOSIS — S161XXA Strain of muscle, fascia and tendon at neck level, initial encounter: Secondary | ICD-10-CM

## 2012-05-30 DIAGNOSIS — Z792 Long term (current) use of antibiotics: Secondary | ICD-10-CM | POA: Insufficient documentation

## 2012-05-30 DIAGNOSIS — I10 Essential (primary) hypertension: Secondary | ICD-10-CM | POA: Insufficient documentation

## 2012-05-30 DIAGNOSIS — Z79899 Other long term (current) drug therapy: Secondary | ICD-10-CM | POA: Insufficient documentation

## 2012-05-30 DIAGNOSIS — Y92410 Unspecified street and highway as the place of occurrence of the external cause: Secondary | ICD-10-CM | POA: Insufficient documentation

## 2012-05-30 DIAGNOSIS — Z0000 Encounter for general adult medical examination without abnormal findings: Secondary | ICD-10-CM | POA: Insufficient documentation

## 2012-05-30 MED ORDER — TRAMADOL HCL 50 MG PO TABS: 50.0000 mg | ORAL_TABLET | Freq: Four times a day (QID) | ORAL | Status: DC | PRN

## 2012-05-30 NOTE — Assessment & Plan Note (Signed)
Glucocorticoid injection of left knee
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
## 2012-05-30 NOTE — Progress Notes (Signed)
  Subjective:    Patient ID: Meredith Owens, female    DOB: [birth year], 71 y.o.   MRN: [id number]  HPI # Preventative She is not taking calcium. Vitamin D made her nauseous in the past.   # Left knee pain, history of osteoarthritis Pain is worse in the morning but constant throughout the day She is requesting steroid injection ROS: denies fevers, erythema, warmth; her knee sometimes swells  Review of Systems Per HPI  Allergies, medication, past medical history reviewed.      Objective:   Physical Exam GEN: NAD CV: RRR, normal S1/S2, no murmurs/gallops PULM: NI WOB; CTAB LEFT KNEE:    No bruising, swelling, erythema, warmth   Mild tenderness medial joint line   60 deg flexion (comparable to right), 180 deg extension   Strength and sensation intact  Procedure: left knee glucocorticoid injection for pain relief for arthritis Informed written consent received  Area to be injected marked and then prepped with Betadine swabs x 2 and alcohol swabs x 2 25 gauge needle inserted medial knee and 4 mL of 2% lidocaine without epinephrine and 80 mg of methylprednisone injected Needle removed and area covered with band-aide Minimal blood loss Patient reported ?improvement in pain with ambulation immediately following Patient informed that pain may improve in next few hours but will be about the same prior to injection tomorrow morning. Expect improvement in 2 days and maximum affect in about a week. Patient expressed understanding.     Assessment & Plan:

## 2012-05-30 NOTE — Assessment & Plan Note (Signed)
Encouraged calcium for bone health
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
## 2012-05-31 ENCOUNTER — Other Ambulatory Visit: Payer: Self-pay | Admitting: Family Medicine

## 2012-05-31 ENCOUNTER — Telehealth: Payer: Self-pay | Admitting: Family Medicine

## 2012-05-31 MED ORDER — LOSARTAN POTASSIUM-HCTZ 100-25 MG PO TABS: 1.0000 | ORAL_TABLET | Freq: Every day | ORAL | Status: DC

## 2012-05-31 NOTE — Telephone Encounter (Signed)
Discussed how overall dosage is the same but stronger so that she only has to take once instead of twice daily.

## 2012-05-31 NOTE — Telephone Encounter (Signed)
Patient is calling because the dosage on the Losartan is different that what she was taking and she thinks this might be incorrect.

## 2012-07-10 ENCOUNTER — Telehealth: Payer: Self-pay | Admitting: Family Medicine

## 2012-07-10 NOTE — Telephone Encounter (Signed)
Patient is calling because she received a letter from [employer] stating that there will be a  quantity limit of 30 per month on Losartin, and she takes 2 per day which will be more that 30.  She wants to know what she needs to do.

## 2012-07-10 NOTE — Telephone Encounter (Signed)
She should only be taking 1 daily. I went up on strength of medication.

## 2012-07-14 ENCOUNTER — Other Ambulatory Visit: Payer: Self-pay | Admitting: Family Medicine

## 2012-07-14 DIAGNOSIS — Z1231 Encounter for screening mammogram for malignant neoplasm of breast: Secondary | ICD-10-CM

## 2012-07-28 ENCOUNTER — Encounter: Payer: Self-pay | Admitting: Family Medicine

## 2012-07-28 ENCOUNTER — Ambulatory Visit (INDEPENDENT_AMBULATORY_CARE_PROVIDER_SITE_OTHER): Payer: Medicare Other | Admitting: Family Medicine

## 2012-07-28 VITALS — BP 194/65 | HR 62 | Temp 98.7°F | Ht 61.5 in | Wt 172.0 lb

## 2012-07-28 DIAGNOSIS — R252 Cramp and spasm: Secondary | ICD-10-CM

## 2012-07-28 DIAGNOSIS — I10 Essential (primary) hypertension: Secondary | ICD-10-CM

## 2012-07-28 HISTORY — DX: Cramp and spasm: R25.2

## 2012-07-28 MED ORDER — BACLOFEN 10 MG PO TABS: 5.0000 mg | ORAL_TABLET | Freq: Every day | ORAL | Status: DC | PRN

## 2012-07-28 NOTE — Assessment & Plan Note (Signed)
Not well controlled especially past week. She attributes this to stress. She does not want to add another anti-hypertensive agent at this time. She wants to see where she is over the next few weeks, she will take her blood pressure medication prior to seeing me, and follow-up in 4 weeks.

## 2012-07-28 NOTE — Assessment & Plan Note (Signed)
She has mentioned this a few time over the past several months. It occurs infrequently, about once a month and lasts for about 20 minutes. We checked BMET in the past but K was normal. Her pulses are good today, and this does not occur consistently with exertion to make PAD high likelihood. She was advised to stretch, use Tylenol as needed, and given Rx for small dose of Robaxin to use when these episodes occur to see if this helps. She was warned Robaxin will probably cause sleepiness, so she was advised to take half of smallest dose tablet. Follow-up as needed.

## 2012-07-28 NOTE — Progress Notes (Signed)
  Subjective:    Patient ID: Meredith Owens, female    DOB: 1941-08-03, 71 y.o.   MRN: [id number]  HPI # HTN She did not take medication this morning Her home blood pressure cuff which she brings today reads 170/72 (SBP 20 less than manual blood pressure reading here) Home BS log usually systolics 120-140s, however, recently several in the 150-160s ROS: denies headache, chest pain, dyspnea  # Leg cramping  She still gets anterior leg cramping Occurred about twice in the past 2 months. 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 When this occurs, she is unable to usher that day Previously, avoiding eating bananas seemed to help but now that does not seem to make a difference Icing and heat do help   Review of Systems  Allergies, medication, past medical history reviewed.      Objective:   Physical Exam Gen: NAD; well-appearing, -nourished PSYCH: pleasant, engaged and normally conversant, appropriate to questions, alert and oriented CV: RRR, normal S1/S2, no m/r/g PULM: NI WOB; CTAB without w/r/r ABD: soft, NT, ND EXT: no edema; no calf tenderness or swelling; negative; 2+popliteal and pedal pulses; Homan's; non-tender throughout lower extremity at this time; warm, dry, pink   NEURO: intact strength lower extremities      Assessment & Plan:

## 2012-07-28 NOTE — Patient Instructions (Addendum)
When you get the cramps, try the muscle relaxant. Try 1/2 tablet at first. You may go up to full tablet if you tolerate lower dose.   Follow-up in 1 month to discuss your blood pressure and knee pain.

## 2012-08-13 ENCOUNTER — Other Ambulatory Visit: Payer: Self-pay | Admitting: Family Medicine

## 2012-08-22 ENCOUNTER — Ambulatory Visit (HOSPITAL_COMMUNITY): Payer: Medicare Other

## 2012-08-22 ENCOUNTER — Telehealth: Payer: Self-pay | Admitting: Family Medicine

## 2012-08-22 NOTE — Telephone Encounter (Signed)
Wants to ask Dr what she thinks about Levaza - for her joint inflammation

## 2012-08-24 ENCOUNTER — Ambulatory Visit (HOSPITAL_COMMUNITY)
Admission: RE | Admit: 2012-08-24 | Discharge: 2012-08-24 | Disposition: A | Discharge status: Home / Self Care | Payer: Medicare Other | Source: Ambulatory Visit | Attending: Family Medicine | Admitting: Family Medicine

## 2012-08-24 DIAGNOSIS — Z1231 Encounter for screening mammogram for malignant neoplasm of breast: Secondary | ICD-10-CM | POA: Insufficient documentation

## 2012-08-24 NOTE — Telephone Encounter (Signed)
We discussed how lovaza probably would not help. Try capsaicin cream along with her Tylenol which she may take 650 mg up to 3 times a day to help with knee arthritis.

## 2012-10-16 ENCOUNTER — Encounter: Payer: Self-pay | Admitting: Home Health Services

## 2012-11-07 ENCOUNTER — Other Ambulatory Visit: Payer: Self-pay | Admitting: Family Medicine

## 2012-11-22 ENCOUNTER — Ambulatory Visit: Payer: Medicare Other | Admitting: Home Health Services

## 2012-11-22 ENCOUNTER — Ambulatory Visit (INDEPENDENT_AMBULATORY_CARE_PROVIDER_SITE_OTHER): Payer: Medicare Other | Admitting: Family Medicine

## 2012-11-22 ENCOUNTER — Encounter: Payer: Self-pay | Admitting: Family Medicine

## 2012-11-22 VITALS — BP 142/70 | Ht 61.5 in | Wt 172.0 lb

## 2012-11-22 DIAGNOSIS — M1710 Unilateral primary osteoarthritis, unspecified knee: Secondary | ICD-10-CM

## 2012-11-22 DIAGNOSIS — Z0000 Encounter for general adult medical examination without abnormal findings: Secondary | ICD-10-CM

## 2012-11-22 DIAGNOSIS — I10 Essential (primary) hypertension: Secondary | ICD-10-CM

## 2012-11-22 DIAGNOSIS — E7800 Pure hypercholesterolemia, unspecified: Secondary | ICD-10-CM

## 2012-11-22 DIAGNOSIS — R252 Cramp and spasm: Secondary | ICD-10-CM

## 2012-11-22 DIAGNOSIS — M170 Bilateral primary osteoarthritis of knee: Secondary | ICD-10-CM

## 2012-11-22 MED ORDER — CALCIUM CARBONATE-VITAMIN D 600-400 MG-UNIT PO TABS: 1.0000 | ORAL_TABLET | Freq: Every day | ORAL | Status: DC

## 2012-11-22 NOTE — Progress Notes (Signed)
  Subjective:    Patient ID: Meredith Owens, female    DOB: Jun 10, 1941, 72 y.o.   MRN: [id number]  HPI # Osteoarthritis of knees Tylenol and ibuprofen help but she tries to avoid latter. She takes former 3/7 days twice a day. It helps the pain.  ROS: denies redness; endorses sometimes looking puffy   # Cramping in legs She still gets them occasionally especially when she stands for prolonged periods of time She is not taking baclofen; she is not sure if it helped.   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  Smoking status noted.     Objective:   Physical Exam GEN: NAD PSYCH: appears mildly depressed  CV: RRR, no murmurs PULM: NI WOB; CTAB ABD: soft, NT, ND EXT: no edema; 2+ pedal pulses KNEE: no significant swelling; mild medial joint line tenderness; no lateral joint line tenderness; intact flexion and extension    Assessment & Plan:

## 2012-11-22 NOTE — Progress Notes (Signed)
Patient here for annual wellness visit, patient reports: Risk Factors/Conditions needing evaluation or treatment: Pt scored 9 on pHQ, scheduled follow up appointment with PCP to discuss. Home Safety: Pt lives in retirement community by her self.  Pt reports having smoke detectors and adaptive equipment. Other Information: Corrective lens: Pt reports some problems with eye, is under care of opthalmology. Dentures: Pt does not have dentures. Memory: Pt reports some memory problems. Patient's Mini Mental Score (recorded in doc. flowsheet): 30      Annual Wellness Visit Requirements Recorded Today In  Medical, family, social history Past Medical, Family, Social History Section  Current providers Care team  Current medications Medications  Wt, BP, Ht, BMI Vital signs  Tobacco, alcohol, illicit drug use History  ADL Nurse Assessment  Depression Screening Nurse Assessment  Cognitive impairment Nurse Assessment  Mini Mental Status Document Flowsheet  Fall Risk Nurse Assessment  Home Safety Progress Note  End of Life Planning (welcome visit) Social Documentation  Medicare preventative services Progress Note  Risk factors/conditions needing evaluation/treatment Progress Note  Personalized health advice Patient Instructions, goals, letter  Diet & Exercise Social Documentation  Emergency Contact Social Documentation  Seat Belts Social Documentation  Sun exposure/protection Social Documentation

## 2012-11-22 NOTE — Assessment & Plan Note (Signed)
Stable. Tylenol a few times a week helps. Continue. She also wears soft knee braces which seem to help.

## 2012-11-22 NOTE — Patient Instructions (Signed)
No changes to your medication  Continue Tylenol and ibuprofen as needed for knee pain  Continue light exercises   Make a morning appointment in the next 4 weeks for labs and to discuss your memory and mood Come in fasting  Try calcium 1200 mg a day, vitamin D 800 mg

## 2012-11-22 NOTE — Assessment & Plan Note (Signed)
About the same. She is not sure baclofen helps. She is okay with monitoring symptoms for now.

## 2012-11-22 NOTE — Assessment & Plan Note (Signed)
We will check lipid panel at next visit. Continue simvastatin.

## 2012-11-22 NOTE — Assessment & Plan Note (Signed)
Controlled. Continue current medications. We will check BMET and lipid panel when she follows up next few weeks.

## 2012-11-29 ENCOUNTER — Ambulatory Visit (INDEPENDENT_AMBULATORY_CARE_PROVIDER_SITE_OTHER): Payer: Medicare Other | Admitting: Family Medicine

## 2012-11-29 ENCOUNTER — Other Ambulatory Visit: Payer: Medicare Other

## 2012-11-29 ENCOUNTER — Encounter: Payer: Self-pay | Admitting: Family Medicine

## 2012-11-29 VITALS — BP 166/72 | HR 56 | Temp 98.8°F | Ht 61.5 in | Wt 168.4 lb

## 2012-11-29 DIAGNOSIS — E7800 Pure hypercholesterolemia, unspecified: Secondary | ICD-10-CM

## 2012-11-29 DIAGNOSIS — I10 Essential (primary) hypertension: Secondary | ICD-10-CM

## 2012-11-29 DIAGNOSIS — M75101 Unspecified rotator cuff tear or rupture of right shoulder, not specified as traumatic: Secondary | ICD-10-CM | POA: Insufficient documentation

## 2012-11-29 DIAGNOSIS — M67919 Unspecified disorder of synovium and tendon, unspecified shoulder: Secondary | ICD-10-CM

## 2012-11-29 LAB — LIPID PANEL
Cholesterol: 160 mg/dL (ref 0–200)
VLDL: 15 mg/dL (ref 0–40)

## 2012-11-29 NOTE — Patient Instructions (Addendum)
Follow-up in 1 month regarding your blood pressure  -Exercise (30 minutes 5 times a week) -Decrease salt intake (do not add salt to foods; avoid fast, frozen foods) -Lose some weight  Measure your blood pressure 3 times a week, write these numbers down   2 Gram Low Sodium Diet A 2 gram sodium diet restricts the amount of sodium in the diet to no more than 2 g or 2000 mg daily. Limiting the amount of sodium is often used to help lower blood pressure. It is important if you have heart, liver, or kidney problems. Many foods contain sodium for flavor and sometimes as a preservative. When the amount of sodium in a diet needs to be low, it is important to know what to look for when choosing foods and drinks. The following includes some information and guidelines to help make it easier for you to adapt to a low sodium diet. QUICK TIPS  Do not add salt to food.  Avoid convenience items and fast food.  Choose unsalted snack foods.  Buy lower sodium products, often labeled as "lower sodium" or "no salt added."  Check food labels to learn how much sodium is in 1 serving.  When eating at a restaurant, ask that your food be prepared with less salt or none, if possible. READING FOOD LABELS FOR SODIUM INFORMATION The nutrition facts label is a good place to find how much sodium is in foods. Look for products with no more than 500 to 600 mg of sodium per meal and no more than 150 mg per serving. Remember that 2 g = 2000 mg. The food label may also list foods as:  Sodium-free: Less than 5 mg in a serving.  Very low sodium: 35 mg or less in a serving.  Low-sodium: 140 mg or less in a serving.  Light in sodium: 50% less sodium in a serving. For example, if a food that usually has 300 mg of sodium is changed to become light in sodium, it will have 150 mg of sodium.  Reduced sodium: 25% less sodium in a serving. For example, if a food that usually has 400 mg of sodium is changed to reduced sodium, it  will have 300 mg of sodium. CHOOSING FOODS Grains  Avoid: Salted crackers and snack items. Some cereals, including instant hot cereals. Bread stuffing and biscuit mixes. Seasoned rice or pasta mixes.  Choose: Unsalted snack items. Low-sodium cereals, oats, puffed wheat and rice, shredded wheat. English muffins and bread. Pasta. Meats  Avoid: Salted, canned, smoked, spiced, pickled meats, including fish and poultry. Bacon, ham, sausage, cold cuts, hot dogs, anchovies.  Choose: Low-sodium canned tuna and salmon. Fresh or frozen meat, poultry, and fish. Dairy  Avoid: Processed cheese and spreads. Cottage cheese. Buttermilk and condensed milk. Regular cheese.  Choose: Milk. Low-sodium cottage cheese. Yogurt. Sour cream. Low-sodium cheese. Fruits and Vegetables  Avoid: Regular canned vegetables. Regular canned tomato sauce and paste. Frozen vegetables in sauces. Olives. Rosita Fire. Relishes. Sauerkraut.  Choose: Low-sodium canned vegetables. Low-sodium tomato sauce and paste. Frozen or fresh vegetables. Fresh and frozen fruit. Condiments  Avoid: Canned and packaged gravies. Worcestershire sauce. Tartar sauce. Barbecue sauce. Soy sauce. Steak sauce. Ketchup. Onion, garlic, and table salt. Meat flavorings and tenderizers.  Choose: Fresh and dried herbs and spices. Low-sodium varieties of mustard and ketchup. Lemon juice. Tabasco sauce. Horseradish. SAMPLE 2 GRAM SODIUM MEAL PLAN Breakfast / Sodium (mg)  1 cup low-fat milk / 143 mg  2 slices whole-wheat toast / 270  mg  1 tbs heart-healthy margarine / 153 mg  1 hard-boiled egg / 139 mg  1 small orange / 0 mg Lunch / Sodium (mg)  1 cup raw carrots / 76 mg   cup hummus / 298 mg  1 cup low-fat milk / 143 mg   cup red grapes / 2 mg  1 whole-wheat pita bread / 356 mg Dinner / Sodium (mg)  1 cup whole-wheat pasta / 2 mg  1 cup low-sodium tomato sauce / 73 mg  3 oz lean ground beef / 57 mg  1 small side salad (1 cup raw  spinach leaves,  cup cucumber,  cup yellow bell pepper) with 1 tsp olive oil and 1 tsp red wine vinegar / 25 mg Snack / Sodium (mg)  1 container low-fat vanilla yogurt / 107 mg  3 graham cracker squares / 127 mg Nutrient Analysis  Calories: 2033  Protein: 77 g  Carbohydrate: 282 g  Fat: 72 g  Sodium: 1971 mg Document Released: 07/26/2005 Document Revised: 10/18/2011 Document Reviewed: 10/27/2009 [facility] Patient Information 2013 [place], [place].

## 2012-11-29 NOTE — Progress Notes (Signed)
  Subjective:    Patient ID: Meredith Owens, female    DOB: 1941/02/11, 72 y.o.   MRN: [id number]  HPI  # Right arm pain  She thinks she slept on it a few days ago. The pain is not as bad but is still uncomfortable.  She thinks she had a shot in it a few years ago since she had problems with that shoulder in the past.   # HTN She thinks stressors contribute.  ROS: denies chest pain, dyspnea, weight gain  Review of Systems Denies arm numbness, neck pain  Allergies, medication, past medical history reviewed.  Smoking status noted.     Objective:   Physical Exam GEN: NAD Shoulder: RIGHT  Inspection: No muscle wasting or winging Ecchymosis/edema: neg  AC joint, scapula, clavicle: NT Cervical spine: NT, full ROM Abduction: full, 4/5 Flexion: 4/5 Neer: pos  Hawkins: pos Empty Can: pos Supraspinatus insertion: NT Bicipital groove: tender Speed's: neg Yergason's: neg Sensation intact Grip 5/5  CV: RRR PULM: NI WOB EXT: no pitting edema     Assessment & Plan:

## 2012-11-29 NOTE — Progress Notes (Signed)
  Subjective:    Patient ID: Meredith Owens, female    DOB: 08-Jun-1941, 72 y.o.   MRN: [id number]  HPI    Review of Systems     Objective:   Physical Exam        Assessment & Plan:

## 2012-11-29 NOTE — Assessment & Plan Note (Signed)
Check lipids today 

## 2012-11-29 NOTE — Addendum Note (Signed)
Addended by: [place], Darl Brisbin on: 11/29/2012 11:40 AM   Modules accepted: Orders

## 2012-11-29 NOTE — Assessment & Plan Note (Signed)
We will try lifestyle modification for a month. Consider adding Norvasc at follow-up if no improvement. Patient agreeable to this plan.

## 2012-11-29 NOTE — Assessment & Plan Note (Signed)
Suspect strain on top of previous injury. She declines steroid injection or prescription pain medications at this time. Shoulder strengthening exercises shown and recommended. Follow-up prn.

## 2012-12-01 ENCOUNTER — Encounter: Payer: Self-pay | Admitting: Family Medicine

## 2013-01-02 ENCOUNTER — Ambulatory Visit (INDEPENDENT_AMBULATORY_CARE_PROVIDER_SITE_OTHER): Payer: Medicare Other | Admitting: Family Medicine

## 2013-01-02 ENCOUNTER — Encounter: Payer: Self-pay | Admitting: Family Medicine

## 2013-01-02 VITALS — BP 175/83 | HR 64 | Temp 98.6°F | Ht 61.5 in | Wt 168.0 lb

## 2013-01-02 DIAGNOSIS — R5383 Other fatigue: Secondary | ICD-10-CM

## 2013-01-02 DIAGNOSIS — R5381 Other malaise: Secondary | ICD-10-CM

## 2013-01-02 DIAGNOSIS — I10 Essential (primary) hypertension: Secondary | ICD-10-CM

## 2013-01-02 DIAGNOSIS — J069 Acute upper respiratory infection, unspecified: Secondary | ICD-10-CM

## 2013-01-02 LAB — COMPREHENSIVE METABOLIC PANEL
BUN: 12 mg/dL (ref 6–23)
CO2: 30 mEq/L (ref 19–32)
Calcium: 9.9 mg/dL (ref 8.4–10.5)
Chloride: 103 mEq/L (ref 96–112)
Creat: 1.25 mg/dL — ABNORMAL HIGH (ref 0.50–1.10)
Glucose, Bld: 96 mg/dL (ref 70–99)
Total Bilirubin: 0.2 mg/dL — ABNORMAL LOW (ref 0.3–1.2)

## 2013-01-02 LAB — TSH: TSH: 1.434 u[IU]/mL (ref 0.350–4.500)

## 2013-01-02 LAB — CBC
HCT: 31.5 % — ABNORMAL LOW (ref 36.0–46.0)
Hemoglobin: 11 g/dL — ABNORMAL LOW (ref 12.0–15.0)
MCHC: 34.9 g/dL (ref 30.0–36.0)
RBC: 3.48 MIL/uL — ABNORMAL LOW (ref 3.87–5.11)
WBC: 11.8 10*3/uL — ABNORMAL HIGH (ref 4.0–10.5)

## 2013-01-02 NOTE — Assessment & Plan Note (Signed)
Resolving without complication

## 2013-01-02 NOTE — Progress Notes (Signed)
  Subjective:    Patient ID: Meredith Owens, female    DOB: January 28, 1941, 72 y.o.   MRN: [id number]  URI    URI - one week of sore throat, hoarseness, cough, fever, chilly, itching left eye without ill contacts. Taking Tussin cough med. All symptoms improving. One episode of diarrhea.   Hypertension - blood pressure was elevated on her last visit, and she's due to see Dr Sharol Given soon.   Feels cold and fatigued - would like her thyroid checked.  Review of Systems     Objective:   Physical Exam  HENT:  Head: Normocephalic.  Right Ear: External ear normal.  Left Ear: External ear normal.  Nose: Nose normal.  Mouth/Throat: Oropharynx is clear and moist. No oropharyngeal exudate.  Eyes: Conjunctivae are normal.  Neck: No thyromegaly present.  Cardiovascular: Normal rate and regular rhythm.   Pulmonary/Chest: Effort normal and breath sounds normal. She has no wheezes. She has no rales.  Abdominal: Soft. There is no tenderness.  Lymphadenopathy:    She has no cervical adenopathy.  Neurological: She is alert.  Psychiatric: She has a normal mood and affect. Her behavior is normal.          Assessment & Plan:

## 2013-01-02 NOTE — Patient Instructions (Addendum)
Please see Dr Sharol Given in a couple weeks and she'll review your blood pressure and lab results.

## 2013-01-02 NOTE — Assessment & Plan Note (Signed)
Not currently well controlled. Might need 24 hour monitor. Will check labs in prep with follow up with Dr Sharol Given

## 2013-01-02 NOTE — Assessment & Plan Note (Signed)
Will check CBC and TSH

## 2013-01-03 ENCOUNTER — Encounter: Payer: Self-pay | Admitting: Family Medicine

## 2013-01-24 ENCOUNTER — Encounter: Payer: Self-pay | Admitting: Family Medicine

## 2013-01-24 ENCOUNTER — Ambulatory Visit (INDEPENDENT_AMBULATORY_CARE_PROVIDER_SITE_OTHER): Payer: Medicare Other | Admitting: Family Medicine

## 2013-01-24 VITALS — BP 130/61 | HR 79 | Temp 98.4°F | Ht 61.5 in | Wt 165.9 lb

## 2013-01-24 DIAGNOSIS — M75101 Unspecified rotator cuff tear or rupture of right shoulder, not specified as traumatic: Secondary | ICD-10-CM

## 2013-01-24 DIAGNOSIS — M67919 Unspecified disorder of synovium and tendon, unspecified shoulder: Secondary | ICD-10-CM

## 2013-01-24 DIAGNOSIS — M1710 Unilateral primary osteoarthritis, unspecified knee: Secondary | ICD-10-CM

## 2013-01-24 DIAGNOSIS — I10 Essential (primary) hypertension: Secondary | ICD-10-CM

## 2013-01-24 DIAGNOSIS — M170 Bilateral primary osteoarthritis of knee: Secondary | ICD-10-CM

## 2013-01-24 NOTE — Patient Instructions (Addendum)
Continue your current medications  Follow-up in 6 months for your blood pressure or sooner if needed (worsening shoulder or knee pain)  Continue shoulder strengthening exercises on both sides when you can  Try to walk more (weight bearing activities) when you can

## 2013-01-24 NOTE — Assessment & Plan Note (Signed)
Improved with increased physical activity, decreased life stressors, improvement in shoulder and knee pain.  Continue current medications. See other A/P regarding other issues.

## 2013-01-24 NOTE — Progress Notes (Signed)
  Subjective:    Patient ID: Meredith Owens, female    DOB: March 14, 1941, 72 y.o.   MRN: [id number]  HPI # HTN It is improved.  She had been in a lot of pain over the past few months; her should and her knees. She thinks the calcium and vitamin D may have helped.  She is taking Tylenol prn for pain. This is working well for her. She stopped taking ibuprofen.   Review of Systems Denies chest pain, difficulty breathing  Allergies, medication, past medical history reviewed.  Smoking status noted.     Objective:   Physical Exam GEN: NAD; well-nourished, -appearing PSYCH: alert and oriented; normal mood and thought content CV: RRR, no m/r/g PULM: NI WOB; CTAB without w/r/r EXT: no edema BILATERAL SHOULDERS: intact ROM; non-tender; minimal pain with resisted external rotation on right shoulder but otherwise no pain can be elicited and 5/5 strength throughout; negative empty-can KNEES: mild medial joint line tenderness; no swelling or erythema     Assessment & Plan:

## 2013-01-24 NOTE — Assessment & Plan Note (Signed)
Stable.  Continue Tylenol p.r.n.

## 2013-01-24 NOTE — Assessment & Plan Note (Signed)
Significantly improved with Tylenol and shoulder strengthening exercises>>>continue. Follow-up prn.

## 2013-01-26 ENCOUNTER — Telehealth: Payer: Self-pay | Admitting: Family Medicine

## 2013-01-26 NOTE — Telephone Encounter (Signed)
Patient is having congestion in her head and cough and would like advice for what otc she can take.  After a second, the patient then said, even if it is an Rx that might help, she would appreciate that.  She just doesn't have a way to get into the hospital but she can have someone pick some medicine up for her.

## 2013-02-01 NOTE — Telephone Encounter (Signed)
Has this been addressed yet?

## 2013-02-01 NOTE — Telephone Encounter (Deleted)
Spoke with patient and advised patient will need an OV for back brace.  He will need to be measured and Dr. Clinton Sawyer can fill out paperwork once this is done.  Pt verbalized understanding.  Margreat Widener, Darlyne Russian, CMA

## 2013-02-01 NOTE — Telephone Encounter (Signed)
Will fwd to MD for advice.  Cillian Gwinner L, CMA  

## 2013-02-02 NOTE — Telephone Encounter (Signed)
For Nasal Congestion : Would recommend nasal afrin (store generic) 1 puff each nostril twice daily for 3 days only For cough - Robitussin with dextropmethoraphan (store generic) for one week If not better by then should be seen  Thanks  Lc

## 2013-02-02 NOTE — Telephone Encounter (Signed)
Pt found some meds OTC and feels better.  Saragrace Selke, Darlyne Russian, CMA

## 2013-02-07 ENCOUNTER — Encounter: Payer: Self-pay | Admitting: Gastroenterology

## 2013-02-12 ENCOUNTER — Other Ambulatory Visit: Payer: Self-pay | Admitting: Family Medicine

## 2013-03-22 ENCOUNTER — Ambulatory Visit (AMBULATORY_SURGERY_CENTER): Payer: Medicare Other | Admitting: *Deleted

## 2013-03-22 VITALS — Ht 61.5 in | Wt 167.4 lb

## 2013-03-22 DIAGNOSIS — Z860100 Personal history of colon polyps, unspecified: Secondary | ICD-10-CM

## 2013-03-22 MED ORDER — MOVIPREP 100 G PO SOLR: ORAL | Status: DC

## 2013-03-22 NOTE — Progress Notes (Signed)
No allergies to eggs or soy. No problems with anesthesia.  

## 2013-04-06 ENCOUNTER — Encounter: Payer: Self-pay | Admitting: Gastroenterology

## 2013-04-06 ENCOUNTER — Ambulatory Visit (AMBULATORY_SURGERY_CENTER): Payer: Medicare Other | Admitting: Gastroenterology

## 2013-04-06 VITALS — BP 134/57 | HR 53 | Temp 97.6°F | Resp 19 | Ht 61.5 in | Wt 167.0 lb

## 2013-04-06 DIAGNOSIS — D126 Benign neoplasm of colon, unspecified: Secondary | ICD-10-CM

## 2013-04-06 DIAGNOSIS — Z86010 Personal history of colonic polyps: Secondary | ICD-10-CM

## 2013-04-06 DIAGNOSIS — K5730 Diverticulosis of large intestine without perforation or abscess without bleeding: Secondary | ICD-10-CM

## 2013-04-06 MED ORDER — SODIUM CHLORIDE 0.9 % IV SOLN: 500.0000 mL | INTRAVENOUS | Status: DC

## 2013-04-06 NOTE — Progress Notes (Signed)
The pt tolerated the colonoscopy very well. Maw   

## 2013-04-06 NOTE — Progress Notes (Signed)
Patient did not have preoperative order for IV antibiotic SSI prophylaxis. (G8918)  Patient did not experience any of the following events: a burn prior to discharge; a fall within the facility; wrong site/side/patient/procedure/implant event; or a hospital transfer or hospital admission upon discharge from the facility. (G8907)  

## 2013-04-06 NOTE — Op Note (Signed)
[facility] [address] [place], [address]   COLONOSCOPY PROCEDURE REPORT  PATIENT: Meredith Owens, Meredith Owens  MR#: [id number] BIRTHDATE: July 25, 1941 , 72  yrs. old GENDER: Female ENDOSCOPIST: Rachael Fee, MD PROCEDURE DATE:  04/06/2013 PROCEDURE:   Colonoscopy with biopsy and Colonoscopy with snare polypectomy First Screening Colonoscopy - Avg.  risk and is 50 yrs.  old or older - No.  Prior Negative Screening - Now for repeat screening. N/A  History of Adenoma - Now for follow-up colonoscopy & has been > or = to 3 yrs.  No.  It has been less than 3 yrs since last colonoscopy.  Medical reason.  Polyps Removed Today? Yes. ASA CLASS:   Class II INDICATIONS:Two adenomatous polyps removed 08/2011, one required piecemeal resection, she was recommended to have repeat examination in 6 months. MEDICATIONS: Fentanyl 75 mcg IV, Versed 5 mg IV, and These medications were titrated to patient response per physician's verbal order  DESCRIPTION OF PROCEDURE:   After the risks benefits and alternatives of the procedure were thoroughly explained, informed consent was obtained.  A digital rectal exam revealed no abnormalities of the rectum.   The LB [id number]  endoscope was introduced through the anus and advanced to the cecum, which was identified by both the appendix and ileocecal valve. No adverse events experienced.   The quality of the prep was good, using MoviPrep  The instrument was then slowly withdrawn as the colon was fully examined.  COLON FINDINGS: Five polyps were found, removed and all were sent to pathology.  These were located in ascending, transverse and sigmoid segments; ranged in size from 2mm to 6mm, all were sessile.  Four of them were removed with cold snare.  One was removed with biopsy forceps (transverse segment).  There were left sided diverticulum. The examination was otherwise normal.  Retroflexed views revealed no abnormalities. The time to  cecum=2 minutes 10 seconds. Withdrawal time=13 minutes 14 seconds.  The scope was withdrawn and the procedure completed. COMPLICATIONS: There were no complications. ENDOSCOPIC IMPRESSION: Five polyps were found, removed and all were sent to pathology. There were left sided diverticulum. The examination was otherwise normal.  RECOMMENDATIONS: If the polyp(s) removed today are proven to be adenomatous (pre-cancerous) polyps, you will need a colonoscopy in 3-5 years. You will receive a letter within 1-2 weeks with the results of your biopsy as well as final recommendations.  Please call my office if you have not received a letter after 3 weeks.  eSigned:  Rachael Fee, MD 04/06/2013 9:52 AM

## 2013-04-06 NOTE — Patient Instructions (Addendum)
YOU HAD AN ENDOSCOPIC PROCEDURE TODAY AT THE [facility]: Refer to the procedure report that was given to you for any specific questions about what was found during the examination.  If the procedure report does not answer your questions, please call your gastroenterologist to clarify.  If you requested that your care partner not be given the details of your procedure findings, then the procedure report has been included in a sealed envelope for you to review at your convenience later.  YOU SHOULD EXPECT: Some feelings of bloating in the abdomen. Passage of more gas than usual.  Walking can help get rid of the air that was put into your GI tract during the procedure and reduce the bloating. If you had a lower endoscopy (such as a colonoscopy or flexible sigmoidoscopy) you may notice spotting of blood in your stool or on the toilet paper. If you underwent a bowel prep for your procedure, then you may not have a normal bowel movement for a few days.  DIET: Your first meal following the procedure should be a light meal and then it is ok to progress to your normal diet.  A half-sandwich or bowl of soup is an example of a good first meal.  Heavy or fried foods are harder to digest and may make you feel nauseous or bloated.  Likewise meals heavy in dairy and vegetables can cause extra gas to form and this can also increase the bloating.  Drink plenty of fluids but you should avoid alcoholic beverages for 24 hours.  ACTIVITY: Your care partner should take you home directly after the procedure.  You should plan to take it easy, moving slowly for the rest of the day.  You can resume normal activity the day after the procedure however you should NOT DRIVE or use heavy machinery for 24 hours (because of the sedation medicines used during the test).    SYMPTOMS TO REPORT IMMEDIATELY: A gastroenterologist can be reached at any hour.  During normal business hours, 8:30 AM to 5:00 PM Monday through Friday,  call [phone].  After hours and on weekends, please call the GI answering service at [phone] who will take a message and have the physician on call contact you.   Following lower endoscopy (colonoscopy or flexible sigmoidoscopy):  Excessive amounts of blood in the stool  Significant tenderness or worsening of abdominal pains  Swelling of the abdomen that is new, acute  Fever of 100F or higher FOLLOW UP: If any biopsies were taken you will be contacted by phone or by letter within the next 1-3 weeks.  Call your gastroenterologist if you have not heard about the biopsies in 3 weeks.  Our staff will call the home number listed on your records the next business day following your procedure to check on you and address any questions or concerns that you may have at that time regarding the information given to you following your procedure. This is a courtesy call and so if there is no answer at the home number and we have not heard from you through the emergency physician on call, we will assume that you have returned to your regular daily activities without incident.  SIGNATURES/CONFIDENTIALITY: You and/or your care partner have signed paperwork which will be entered into your electronic medical record.  These signatures attest to the fact that that the information above on your After Visit Summary has been reviewed and is understood.  Full responsibility of the confidentiality of this discharge   information lies with you and/or your care-partner.  Recommendations See procedure report recommendations 

## 2013-04-06 NOTE — Progress Notes (Signed)
No egg or soy allergy. ewm No past problems with sedation. ewm Pt has prosthetic right eye. ewm

## 2013-04-10 ENCOUNTER — Telehealth: Payer: Self-pay | Admitting: *Deleted

## 2013-04-10 NOTE — Telephone Encounter (Signed)
  Follow up Call-  Call back number 04/06/2013 09/01/2011  Post procedure Call Back phone  # [phone] ok mess  Permission to leave phone message Yes -     Patient questions:  Do you have a fever, pain , or abdominal swelling? no Pain Score  0 *  Have you tolerated food without any problems? yes  Have you been able to return to your normal activities? yes  Do you have any questions about your discharge instructions: Diet   yes Medications  no Follow up visit  no  Do you have questions or concerns about your Care? no  Actions: * If pain score is 4 or above: No action needed, pain <4.

## 2013-04-13 ENCOUNTER — Other Ambulatory Visit: Payer: Self-pay | Admitting: Family Medicine

## 2013-04-13 ENCOUNTER — Encounter: Payer: Self-pay | Admitting: Gastroenterology

## 2013-05-09 ENCOUNTER — Other Ambulatory Visit: Payer: Self-pay | Admitting: Family Medicine

## 2013-05-09 MED ORDER — LOSARTAN POTASSIUM-HCTZ 100-25 MG PO TABS: 1.0000 | ORAL_TABLET | Freq: Every day | ORAL | Status: DC

## 2013-05-09 NOTE — Telephone Encounter (Signed)
Will forward to MD. Rozalia Dino,CMA  

## 2013-05-09 NOTE — Telephone Encounter (Signed)
Pt is aware but states that she asked pharmacy for refill on her losartan hctz.  Has an appt with you next month and needs enough meds to make it til then.  Please advise.  Braiden Rodman,CMA

## 2013-05-09 NOTE — Addendum Note (Signed)
Addended by: Nani Ravens on: 05/09/2013 07:36 PM   Modules accepted: Orders

## 2013-05-09 NOTE — Telephone Encounter (Signed)
Losartan-HCTZ prescription refilled. Thanks! -Greig Castilla

## 2013-05-10 NOTE — Telephone Encounter (Signed)
Pt is aware.  Meredith Owens,CMA  

## 2013-06-18 ENCOUNTER — Ambulatory Visit: Payer: Medicare Other | Admitting: Family Medicine

## 2013-06-18 ENCOUNTER — Encounter: Payer: Self-pay | Admitting: Family Medicine

## 2013-06-18 ENCOUNTER — Ambulatory Visit (INDEPENDENT_AMBULATORY_CARE_PROVIDER_SITE_OTHER): Payer: Medicare Other | Admitting: Family Medicine

## 2013-06-18 VITALS — BP 183/69 | HR 62 | Temp 98.4°F | Wt 162.0 lb

## 2013-06-18 DIAGNOSIS — Z23 Encounter for immunization: Secondary | ICD-10-CM

## 2013-06-18 DIAGNOSIS — I10 Essential (primary) hypertension: Secondary | ICD-10-CM

## 2013-06-18 NOTE — Patient Instructions (Signed)
It was nice to meet you!  Continue taking your medications as prescribed and measuring your blood pressure at home. It is very helpful for you to bring in your medications and your list of blood pressures at home.  We will discuss getting the pneumococcal vaccine at the next visit.

## 2013-06-18 NOTE — Progress Notes (Signed)
  Subjective:    Patient ID: Meredith Owens, female    DOB: 01-26-1941, 72 y.o.   MRN: [id number]  HPI  Here to meet with me for first time  # Hypertension - elevated today in clinic, but says she did not take her medications since she was fasting in case blood work was going to be done. - keeps a log of home measurements, all under 140/90 with most in low 130s/80s  # Vaccinations - will get flu shot today - will consider getting pneumococcal vaccine at next visit - does not want shingles vaccine (says she never had chicken pox as a child, I explained that it is still possible for her to get shingles).  Review of Systems Weight loss (wanted/expected). No CP or SOB.     Objective:   Physical Exam BP 183/69  Pulse 62  Temp(Src) 98.4 F (36.9 C) (Oral)  Wt 162 lb (73.483 kg)  General: NAD CV: RRR, no murmurs Resp: CTAB, effort normal     Assessment & Plan:  See Problem List documentation

## 2013-06-18 NOTE — Assessment & Plan Note (Addendum)
Elevated today but did not take meds. Home log of BP readings all under 140/90. At this point will keep her medications the same, but given age and BPs in 120-130s, may need to reconsider backing off if she starts to display symptoms.

## 2013-07-27 ENCOUNTER — Other Ambulatory Visit: Payer: Self-pay | Admitting: Family Medicine

## 2013-07-27 DIAGNOSIS — Z1231 Encounter for screening mammogram for malignant neoplasm of breast: Secondary | ICD-10-CM

## 2013-08-13 ENCOUNTER — Other Ambulatory Visit: Payer: Self-pay | Admitting: Family Medicine

## 2013-08-27 ENCOUNTER — Ambulatory Visit (HOSPITAL_COMMUNITY)
Admission: RE | Admit: 2013-08-27 | Discharge: 2013-08-27 | Disposition: A | Discharge status: Home / Self Care | Payer: Medicare Other | Source: Ambulatory Visit | Attending: Family Medicine | Admitting: Family Medicine

## 2013-08-27 DIAGNOSIS — Z1231 Encounter for screening mammogram for malignant neoplasm of breast: Secondary | ICD-10-CM | POA: Insufficient documentation

## 2013-11-12 ENCOUNTER — Encounter: Payer: Self-pay | Admitting: Family Medicine

## 2013-11-12 ENCOUNTER — Other Ambulatory Visit: Payer: Self-pay | Admitting: Family Medicine

## 2013-11-12 ENCOUNTER — Ambulatory Visit (INDEPENDENT_AMBULATORY_CARE_PROVIDER_SITE_OTHER): Payer: Medicare Other | Admitting: Family Medicine

## 2013-11-12 VITALS — BP 134/58 | HR 58 | Temp 98.3°F | Wt 165.0 lb

## 2013-11-12 DIAGNOSIS — R5383 Other fatigue: Secondary | ICD-10-CM | POA: Insufficient documentation

## 2013-11-12 DIAGNOSIS — Z0000 Encounter for general adult medical examination without abnormal findings: Secondary | ICD-10-CM

## 2013-11-12 DIAGNOSIS — R5381 Other malaise: Secondary | ICD-10-CM

## 2013-11-12 DIAGNOSIS — Z79899 Other long term (current) drug therapy: Secondary | ICD-10-CM

## 2013-11-12 DIAGNOSIS — E7800 Pure hypercholesterolemia, unspecified: Secondary | ICD-10-CM

## 2013-11-12 DIAGNOSIS — I10 Essential (primary) hypertension: Secondary | ICD-10-CM

## 2013-11-12 DIAGNOSIS — Z131 Encounter for screening for diabetes mellitus: Secondary | ICD-10-CM

## 2013-11-12 LAB — COMPREHENSIVE METABOLIC PANEL
ALK PHOS: 55 U/L (ref 39–117)
ALT: 12 U/L (ref 0–35)
AST: 18 U/L (ref 0–37)
Albumin: 4.1 g/dL (ref 3.5–5.2)
BUN: 19 mg/dL (ref 6–23)
CALCIUM: 9.9 mg/dL (ref 8.4–10.5)
CHLORIDE: 105 meq/L (ref 96–112)
CO2: 28 mEq/L (ref 19–32)
CREATININE: 1.16 mg/dL — AB (ref 0.50–1.10)
Glucose, Bld: 101 mg/dL — ABNORMAL HIGH (ref 70–99)
POTASSIUM: 3.7 meq/L (ref 3.5–5.3)
Sodium: 140 mEq/L (ref 135–145)
Total Bilirubin: 0.4 mg/dL (ref 0.2–1.2)
Total Protein: 7.1 g/dL (ref 6.0–8.3)

## 2013-11-12 LAB — POCT GLYCOSYLATED HEMOGLOBIN (HGB A1C): Hemoglobin A1C: 6.2

## 2013-11-12 NOTE — Assessment & Plan Note (Signed)
Initial BP 157/68, recheck 134/58. Home log all under 150/90. Taking medications as prescribed P: 3 month f/u, patient to bring in home cuff to compare with clinic. Cmet (last in May 2014 with creatinine mildly increased).

## 2013-11-12 NOTE — Assessment & Plan Note (Signed)
Discussed mammogram and colonoscopy

## 2013-11-12 NOTE — Assessment & Plan Note (Signed)
Non-specific complaints similar to last year, some overall fatigue; no specific areas of weakness. Will hold off on checking CBC/TSH at this time. If symptoms continue will repeat labs.

## 2013-11-12 NOTE — Assessment & Plan Note (Signed)
On simvastatin. No A1c screen done (though Cmet glucose have all been normal).  P: continue simvastatin, check A1c.

## 2013-11-12 NOTE — Patient Instructions (Signed)
It was good to see you again.   Please continue to check your BP. At your next visit please bring your home BP cuff in and we will check it.  If your lab results are normal, you will get a letter, if there is something we need to discuss someone will call you.

## 2013-11-12 NOTE — Progress Notes (Signed)
Patient ID: Meredith Owens, female   DOB: [birth year], 73 y.o.   MRN: [id number]   Subjective:    Patient ID: Meredith Owens, female    DOB: November 28, 1940, 73 y.o.   MRN: [id number]  HPI  CC: BP follow up  # Blood pressure:  Taking medications as prescribed  Measures BP at home, most in 120-130s, range 518-841 systolic; pulses in 66-06T ROS: denies headache, CP, SOB, changes in vision  # Fatigue  Has felt more tired lately  Has trouble with sleep sometimes, doesn't often get restful night's sleep ROS: denies fevers/chills, heat or cold intolerance, no changes in weight  Review of Systems   See HPI for ROS. Objective:  BP 157/68  Pulse 58  Temp(Src) 98.3 F (36.8 C) (Oral)  Wt 165 lb (74.844 kg) BP re-check 134/58  General: NAD Cardiac: RRR, normal heart sounds, no murmurs. No carotid bruits. 2+ radial and PT pulses bilaterally Respiratory: CTAB, normal effort Extremities: no edema or cyanosis. WWP. Skin: warm and dry, no rashes noted  Assessment & Plan:  See Problem List Documentation

## 2013-11-13 ENCOUNTER — Telehealth: Payer: Self-pay | Admitting: Family Medicine

## 2013-11-13 NOTE — Telephone Encounter (Signed)
Called patient to discuss her Hemoglobin A1c being in prediabetes range (6.2). Patient will call clinic and schedule an appointment to follow up and discuss recommendations for this. -Dr Lamar Benes

## 2013-11-20 ENCOUNTER — Encounter: Payer: Self-pay | Admitting: Family Medicine

## 2013-11-20 ENCOUNTER — Ambulatory Visit (INDEPENDENT_AMBULATORY_CARE_PROVIDER_SITE_OTHER): Payer: Medicare Other | Admitting: Family Medicine

## 2013-11-20 VITALS — BP 118/71 | HR 61 | Temp 98.8°F | Ht 61.5 in | Wt 161.0 lb

## 2013-11-20 DIAGNOSIS — R7303 Prediabetes: Secondary | ICD-10-CM

## 2013-11-20 DIAGNOSIS — R7309 Other abnormal glucose: Secondary | ICD-10-CM

## 2013-11-20 HISTORY — DX: Prediabetes: R73.03

## 2013-11-20 NOTE — Assessment & Plan Note (Signed)
Long discussion regarding diagnosis of prediabetes and what to do going forward. She already had good insight into diabetes, what it means, changes that need to be made. Discussed exercising and diet modifications (went over DM diet in AVS extensively). She does not want to start metformin or additional medications at this time. She may be interested in nutritional counseling with Dr. Jenne Campus in the future. F/u 3 months (for HTN f/u as well).

## 2013-11-20 NOTE — Patient Instructions (Signed)
  Diet Recommendations for Diabetes   Starchy (carb) foods include: Bread, rice, pasta, potatoes, corn, crackers, bagels, muffins, all baked goods.  (Fruits, milk, and yogurt also have carbohydrate, but most of these foods will not spike your blood sugar as the starchy foods will.)  A few fruits do cause high blood sugars; use small portions of bananas (limit to 1/2 at a time), grapes, and tropical fruits.    Protein foods include: Meat, fish, poultry, eggs, dairy foods, and beans such as pinto and kidney beans (beans also provide carbohydrate).   1. Eat at least 3 meals and 1-2 snacks per day. Never go more than 4-5 hours while awake without eating.  2. Limit starchy foods to TWO per meal and ONE per snack. ONE portion of a starchy  food is equal to the following:   - ONE slice of bread (or its equivalent, such as half of a hamburger bun).   - 1/2 cup of a "scoopable" starchy food such as potatoes or rice.   - 15 grams of carbohydrate as shown on food label.  3. Both lunch and dinner should include a protein food, a carb food, and vegetables.   - Obtain twice as many veg's as protein or carbohydrate foods for both lunch and dinner.   - Fresh or frozen veg's are best.   - Try to keep frozen veg's on hand for a quick vegetable serving.    4. Breakfast should always include protein.

## 2013-11-20 NOTE — Progress Notes (Signed)
Patient ID: Meredith Owens, female   DOB: 1941-07-17, 73 y.o.   MRN: [id number]   Subjective:    Patient ID: Meredith Owens, female    DOB: 1940-10-05, 73 y.o.   MRN: [id number]  HPI  CC: Elevated A1c/Prediabetes  # Prediabetes:  A1c 6.2 at last visit 1 week ago  Husband had diabetes, but no other family history  Knows diabetes has to do with the pancreas and "cutting sugar out"  Foods: "sweet eater", has started to change from white breads to whole wheat/oat.   Exercise: has recently started with TV exercise program in the mornings that is for 21+ year olds; she enjoys doing this  She does not want to start any medications ROS: changes in vision (has prosthetic right eye, left eye with glaucoma/retinal detachment), no numbness/tingling of extremities, +fatigue over past 3-4 months.  Review of Systems   See HPI for ROS. Objective:  BP 118/71  Pulse 61  Temp(Src) 98.8 F (37.1 C) (Oral)  Ht 5' 1.5" (1.562 m)  Wt 161 lb (73.029 kg)  BMI 29.93 kg/m2  General: NAD HEENT: right prosthetic eye Ext: no edema/cyanosis     Assessment & Plan:  See Problem List Documentation

## 2014-01-29 ENCOUNTER — Telehealth: Payer: Self-pay | Admitting: Family Medicine

## 2014-01-29 NOTE — Telephone Encounter (Signed)
Need to discuss ?of having electro therapy for hands.  She was given this information by her children and they wanted her to speak with provider about this.  Please call back and discuss.

## 2014-02-20 ENCOUNTER — Ambulatory Visit (INDEPENDENT_AMBULATORY_CARE_PROVIDER_SITE_OTHER): Payer: Medicare Other | Admitting: Family Medicine

## 2014-02-20 ENCOUNTER — Encounter: Payer: Self-pay | Admitting: Family Medicine

## 2014-02-20 VITALS — BP 130/70 | HR 60 | Temp 98.2°F | Ht 61.5 in | Wt 155.5 lb

## 2014-02-20 DIAGNOSIS — I10 Essential (primary) hypertension: Secondary | ICD-10-CM

## 2014-02-20 DIAGNOSIS — R7309 Other abnormal glucose: Secondary | ICD-10-CM

## 2014-02-20 DIAGNOSIS — R7303 Prediabetes: Secondary | ICD-10-CM

## 2014-02-20 NOTE — Assessment & Plan Note (Signed)
Initial BP again elevated, but repeat in normal range 130/70. Home log max SBP 154, average 110-130s. P: Continue current regimen. F/u in 3 months.

## 2014-02-20 NOTE — Progress Notes (Signed)
Patient ID: Meredith Owens, female   DOB: 09-Oct-1940, 73 y.o.   MRN: [id number]   Subjective:    Patient ID: Meredith Owens, female    DOB: [birth year], 73 y.o.   MRN: [id number]  HPI  CC: follow up  # Hypertension:  No complaints with current medications  Home BP cuff has stopped working, but brings in log of daily BPs  High 154/100  Low 110/54  Average around 110-130/50-70 ROS: No dizziness, no headaches, no CP,  # Prediabetes  Diet changes: increased vegetables, cut bread and sugars  Has lost some weight (intentional) ROS: no numbness/tingling of extremities, no changes in vision  Review of Systems   See HPI for ROS. Objective:  BP 170/75  Pulse 60  Temp(Src) 98.2 F (36.8 C) (Oral)  Ht 5' 1.5" (1.562 m)  Wt 155 lb 8 oz (70.534 kg)  BMI 28.91 kg/m2  Repeat BP 130/70 manual cuff  General: NAD HEENT: PERRL, EOMI. Cardiac: RRR, normal heart sounds, no murmurs. 2+ radial and PT pulses bilaterally Respiratory: CTAB, normal effort Extremities: no edema or cyanosis. WWP. Skin: warm and dry, no rashes noted Neuro: alert and oriented, no focal deficits     Assessment & Plan:  See Problem List Documentation

## 2014-02-20 NOTE — Assessment & Plan Note (Signed)
Pt working on diet/exercise, lost ~6lbs since April. Avoiding sugars/starches. P: continue lifestyle modifications, no medical therapy at this time. Repeat A1c at next visit.

## 2014-02-20 NOTE — Patient Instructions (Addendum)
It was good to see you today.  Continue taking your blood pressure medications. Change the batteries in your home BP machine with brand new batteries, if the device is still not working correctly I would suggest getting a new one. Once your BP machine is working, you can come to the clinic for a nursing visit and have it checked against our machines.  Keep up the good work with the diet changes and weight loss!

## 2014-02-21 NOTE — Telephone Encounter (Signed)
Discussed this issue at clinic visit.

## 2014-05-06 ENCOUNTER — Other Ambulatory Visit: Payer: Self-pay | Admitting: Family Medicine

## 2014-05-30 ENCOUNTER — Ambulatory Visit (INDEPENDENT_AMBULATORY_CARE_PROVIDER_SITE_OTHER): Payer: Medicare Other | Admitting: Family Medicine

## 2014-05-30 ENCOUNTER — Encounter: Payer: Self-pay | Admitting: Family Medicine

## 2014-05-30 VITALS — BP 146/66 | HR 58 | Temp 98.3°F | Wt 157.0 lb

## 2014-05-30 DIAGNOSIS — I10 Essential (primary) hypertension: Secondary | ICD-10-CM

## 2014-05-30 DIAGNOSIS — R7303 Prediabetes: Secondary | ICD-10-CM

## 2014-05-30 DIAGNOSIS — R7309 Other abnormal glucose: Secondary | ICD-10-CM

## 2014-05-30 DIAGNOSIS — Z23 Encounter for immunization: Secondary | ICD-10-CM

## 2014-05-30 NOTE — Patient Instructions (Addendum)
It was good to see you.   Your home blood pressures are very good. We won't make any changes to the medications today.  Schedule a follow up around or just before your birthday in March. We will test your blood sugar again then.

## 2014-05-30 NOTE — Assessment & Plan Note (Signed)
White coat hypertension. Home BP log shows excellent control. Plan: continue current regimen. F/u in 4-6 months.

## 2014-05-30 NOTE — Progress Notes (Signed)
Patient ID: Meredith Owens, female   DOB: 1941/01/10, 73 y.o.   MRN: [id number]   Subjective:    Patient ID: Meredith Owens, female    DOB: [birth year], 73 y.o.   MRN: [id number]  HPI  CC: f/u hypertension  # Hypertension:  Brings home log: 110-120s/60-70s. Only one reading in 140s, no lows. Noted at prior visits her home cuff runs 10 higher than clinic, so would expect 100-110s/50-60s.  No issues with her current medications ROS: No CP, no SOB, no changes in vision  # Prediabetes  Exercise: follows along with PBS workouts on TV  Diet: continuing to work on more vegetables. She does admit to sweets with special occassions ROS: no dysuria, polyuria, weakness/fatigue.  Review of Systems   See HPI for ROS. All other systems reviewed and are negative.  Past medical history, surgical, family, and social history reviewed and updated in the EMR as appropriate. Objective:  BP 169/77  Pulse 58  Temp(Src) 98.3 F (36.8 C) (Oral)  Wt 157 lb (71.215 kg) Vitals reviewed; repeat manual BP lower 146/66  General: NAD CV: RRR, normal s1 and s2, no murmur. 2+ radial and PT pulses bilaterally Resp: CTAB, normal effort Abdomen: soft, nontender, +BS Ext: no edema or cyanosis, wwp. Neuro: alert and oriented. Normal gait.  Assessment & Plan:  See Problem List Documentation

## 2014-05-30 NOTE — Assessment & Plan Note (Signed)
Continuing to work on weight loss, target goal 150lbs. Will re-check a1c at next visit in 4-6 months.

## 2014-07-11 ENCOUNTER — Other Ambulatory Visit: Payer: Self-pay | Admitting: Family Medicine

## 2014-07-22 ENCOUNTER — Other Ambulatory Visit: Payer: Self-pay | Admitting: Family Medicine

## 2014-07-22 DIAGNOSIS — Z1231 Encounter for screening mammogram for malignant neoplasm of breast: Secondary | ICD-10-CM

## 2014-08-29 ENCOUNTER — Ambulatory Visit (HOSPITAL_COMMUNITY)
Admission: RE | Admit: 2014-08-29 | Discharge: 2014-08-29 | Disposition: A | Discharge status: Home / Self Care | Payer: PPO | Source: Ambulatory Visit | Attending: Family Medicine | Admitting: Family Medicine

## 2014-08-29 DIAGNOSIS — Z1231 Encounter for screening mammogram for malignant neoplasm of breast: Secondary | ICD-10-CM | POA: Diagnosis not present

## 2014-09-25 ENCOUNTER — Encounter: Payer: Self-pay | Admitting: Family Medicine

## 2014-09-25 ENCOUNTER — Ambulatory Visit (INDEPENDENT_AMBULATORY_CARE_PROVIDER_SITE_OTHER): Payer: PPO | Admitting: Family Medicine

## 2014-09-25 VITALS — BP 154/61 | HR 58 | Temp 98.6°F | Ht 61.5 in | Wt 155.5 lb

## 2014-09-25 DIAGNOSIS — R7303 Prediabetes: Secondary | ICD-10-CM

## 2014-09-25 DIAGNOSIS — R7309 Other abnormal glucose: Secondary | ICD-10-CM

## 2014-09-25 DIAGNOSIS — L989 Disorder of the skin and subcutaneous tissue, unspecified: Secondary | ICD-10-CM | POA: Insufficient documentation

## 2014-09-25 DIAGNOSIS — Z0000 Encounter for general adult medical examination without abnormal findings: Secondary | ICD-10-CM

## 2014-09-25 DIAGNOSIS — I10 Essential (primary) hypertension: Secondary | ICD-10-CM

## 2014-09-25 DIAGNOSIS — S20329A Blister (nonthermal) of unspecified front wall of thorax, initial encounter: Secondary | ICD-10-CM

## 2014-09-25 LAB — POCT GLYCOSYLATED HEMOGLOBIN (HGB A1C): HEMOGLOBIN A1C: 5.9

## 2014-09-25 NOTE — Assessment & Plan Note (Signed)
Central chest, appeared abruptly in the last month, ruptured. Nontender, not painful. Watchful waiting at this time, if does not resolve in the next month, gets bigger, pt to RTC to be re-evaluated.

## 2014-09-25 NOTE — Progress Notes (Signed)
   Subjective:    Patient ID: Meredith Owens, female    DOB: [birth year], 74 y.o.   MRN: [id number]  HPI  CC: follow up  # Hypertension:  Doing well, no concerns from patient  Continues home BP monitor, brings in log and reviewed back to December all readings excellent control. Typical SBP in 120-130s, few 110s. ROS: No CP, no SOB, no dizziness/lightheadedness  # Prediabetes  Had a1c 6.2 last year  Has been working on her diet, eating less sugar ROS: no polyuria, polydipsia  # Skin lesion on chest  Noted several weeks ago  Started as small fluid filled area, which was rubbed against with clothing and broke apart.  Has come back but now smaller and not as much fluid in it  Review of Systems   See HPI for ROS. All other systems reviewed and are negative.  Past medical history, surgical, family, and social history reviewed and updated in the EMR as appropriate. Objective:  BP 154/61 mmHg  Pulse 58  Temp(Src) 98.6 F (37 C) (Oral)  Ht 5' 1.5" (1.562 m)  Wt 155 lb 8 oz (70.534 kg)  BMI 28.91 kg/m2 Vitals and nursing note reviewed  General: NAD CV: RRR, normal heart sounds, no mrg Resp: CTAB normal effort Skin: approx 68mm vesicle located central chest, only small amount of fluid in lesion that does transluminate, nontender, no erythema, no drainage   Assessment & Plan:  See Problem List Documentation

## 2014-09-25 NOTE — Progress Notes (Signed)
Patient here for annual wellness visit, patient reports: Risk Factors/Conditions needing evaluation or treatment: Pt. would like PCP to look at spot on her chest.  Home Safety: Pt lives at home, by self in a 1 story home.  Pt reports having smoke alarms and has adaptive equipment.  Other Information: Corrective lens: Pt wears corrective lens, has annual eye exams. Was diagnosed with glaucoma in 2015.  Dentures: Pt does not have dentures, has regular dental exams. Memory: Pt denies memory problems.  Patient's Mini Mental Score (recorded in doc. flowsheet): 30 Bladder:  Pt denies problems with bladder control.  BMI/Exercise: We discussed BMI and strategies for weight maintenance including balance meals and regular exercise.  We also discussed  maintaining a regular exercise routine. Reports doing group exercises 1x a week and continues at home 2-3 x a week. Med Adherence:  We discussed importance of taking medications for HTn.  Pt reports missing 0 day in the past week.  ADL/IADL:  Pt reports independence in all functions. Balance/Gait: Pt reports 0 falls in the past year.  We discussed home safety and fall prevention.   Hearing:  Pt denies any problems with hearing.        Annual Wellness Visit Requirements Recorded Today In  Medical, family, social history Past Medical, Family, Social History Section  Current providers Care team  Current medications Medications  Wt, BP, Ht, BMI Vital signs  Hearing assessment (welcome visit) Progress note  Tobacco, alcohol, illicit drug use History  ADL Nurse Assessment  Depression Screening Nurse Assessment  Cognitive impairment Document Flowsheet  Mini Mental Status Document Flowsheet  Fall Risk Fall/Depression  Home Safety Progress Note  End of Life Planning (welcome visit) Social Documentation  Medicare preventative services Health Maintenance  Risk factors/conditions needing evaluation/treatment Progress Note  Personalized health advice  Patient Instructions, goals, letter  Diet & Exercise Social Documentation  Emergency Contact Social Documentation  Seat Belts Social Documentation  Sun exposure/protection Social Documentation

## 2014-09-25 NOTE — Assessment & Plan Note (Signed)
Doing well with diet changes. Re-check A1c today.

## 2014-09-25 NOTE — Assessment & Plan Note (Signed)
Doing well. Still elevated by clinic measurement, but brings in logs from home monitor showing very good control in 110s-110s SBP. No changes today.

## 2014-09-25 NOTE — Patient Instructions (Signed)
We will check your blood sugar again today.  Bring in your Blood pressure cuff next visit! We will compare this to ours at the office.

## 2014-09-26 ENCOUNTER — Encounter: Payer: Self-pay | Admitting: Family Medicine

## 2014-09-26 ENCOUNTER — Encounter (HOSPITAL_COMMUNITY): Payer: Self-pay | Admitting: Family Medicine

## 2014-09-26 ENCOUNTER — Ambulatory Visit: Payer: PPO | Admitting: Home Health Services

## 2015-01-22 ENCOUNTER — Ambulatory Visit (INDEPENDENT_AMBULATORY_CARE_PROVIDER_SITE_OTHER): Payer: PPO | Admitting: Family Medicine

## 2015-01-22 ENCOUNTER — Encounter: Payer: Self-pay | Admitting: Family Medicine

## 2015-01-22 VITALS — BP 143/62 | HR 61 | Temp 98.4°F | Ht 61.5 in | Wt 155.2 lb

## 2015-01-22 DIAGNOSIS — R7309 Other abnormal glucose: Secondary | ICD-10-CM | POA: Diagnosis not present

## 2015-01-22 DIAGNOSIS — H409 Unspecified glaucoma: Secondary | ICD-10-CM

## 2015-01-22 DIAGNOSIS — E7800 Pure hypercholesterolemia, unspecified: Secondary | ICD-10-CM

## 2015-01-22 DIAGNOSIS — R7303 Prediabetes: Secondary | ICD-10-CM

## 2015-01-22 DIAGNOSIS — I10 Essential (primary) hypertension: Secondary | ICD-10-CM | POA: Diagnosis not present

## 2015-01-22 DIAGNOSIS — L989 Disorder of the skin and subcutaneous tissue, unspecified: Secondary | ICD-10-CM

## 2015-01-22 LAB — BASIC METABOLIC PANEL WITH GFR
BUN: 14 mg/dL (ref 6–23)
CALCIUM: 10 mg/dL (ref 8.4–10.5)
CO2: 27 mEq/L (ref 19–32)
Chloride: 102 mEq/L (ref 96–112)
Creat: 1.26 mg/dL — ABNORMAL HIGH (ref 0.50–1.10)
GFR, Est African American: 49 mL/min — ABNORMAL LOW
GFR, Est Non African American: 42 mL/min — ABNORMAL LOW
GLUCOSE: 87 mg/dL (ref 70–99)
Potassium: 3.9 mEq/L (ref 3.5–5.3)
SODIUM: 138 meq/L (ref 135–145)

## 2015-01-22 LAB — LIPID PANEL
Cholesterol: 136 mg/dL (ref 0–200)
HDL: 63 mg/dL (ref >=46)
LDL CALC: 56 mg/dL (ref 0–99)
TRIGLYCERIDES: 87 mg/dL (ref <150)
Total CHOL/HDL Ratio: 2.2 Ratio
VLDL: 17 mg/dL (ref 0–40)

## 2015-01-22 NOTE — Assessment & Plan Note (Signed)
Stable appearance for past 4 months. Pt declines skin biopsy as it is not bothering her. DDx would be scar from blister, BCC, SK. Instructed to monitor and if changes to RTC. F/u as needed.

## 2015-01-22 NOTE — Assessment & Plan Note (Signed)
Feels she is doing better with avoiding sodas, still likes to eat sweets/desserts particularly on special occassions. Weight has remained 10lbs down rfom 1 year ago. Will not repeat A1c today. F/u 6 months.

## 2015-01-22 NOTE — Progress Notes (Signed)
   Subjective:    Patient ID: Meredith Owens, female    DOB: October 27, 1940, 74 y.o.   MRN: [id number]  HPI  CC: hypertension  # Hypertension:  Home BP cuff readings = 100-130s/50-70s  Home BP cuff 165/70, 151/68  Taking medicines as prescribed ROS: no lightheadedness/dizziness, no CP, no SOB  # Prediabetes  Weight is down 10lbs since 1 year ago  Sugar intake: multiple birthdays/special events past several months  Exercise: walking, dancing in apartment.  ROS: no CP, no SOB, no dysuria, no polyuria  # Blister/skin lesion on chest   No changes in size, shape, color  Not causing her much issue, not itchy or painful  Does not desire biopsy currently since it is not causing her much issue  Also mentions few red dots that show up around her body, has one on her left wrist that has stayed for a few days (says normally goes away) -- no trauma to area, didn't get pinched  # Hyperlipidemia  No issues with her current simvastatin, denies any muscle aches  Review of Systems   See HPI for ROS. All other systems reviewed and are negative.  Past medical history, surgical, family, and social history reviewed and updated in the EMR as appropriate. Objective:  BP 143/62 mmHg  Pulse 61  Temp(Src) 98.4 F (36.9 C) (Oral)  Ht 5' 1.5" (1.562 m)  Wt 155 lb 3 oz (70.393 kg)  BMI 28.85 kg/m2 Vitals and nursing note reviewed  General: NAD CV: RRR, nl s1s2, no m/r/g. 2+ radial pulses, no LE edema Resp: CTAB, nl effort Skin: Upper chest wall near midline -- 22mm x 85mm circular raised mildly hyperpigmented lesion, does not appear fluid filled currently (prior note states vesicular/small amount of fluid), not indurated, slight erythema ring around the lesion.  Assessment & Plan:  See Problem List Documentation

## 2015-01-22 NOTE — Assessment & Plan Note (Signed)
Recheck lipid panel Continue simvastatin 

## 2015-01-22 NOTE — Patient Instructions (Signed)
Check lab work today, results will be mailed to you.  Keep up the good work with your diet and exercising.

## 2015-01-22 NOTE — Assessment & Plan Note (Signed)
Well controlled. Brings in home meter today and appears to be at least consistent with clinic readings, notably her home meter and repeat BP by myself showed BP 740-992 systolic which is consistent with notion of whitecoat hypertension. Her home logs are all well controlled with the highest systolic in the 780Q, generally around 110-120s. No changes to regimen. F/u 6 months.

## 2015-02-03 ENCOUNTER — Encounter: Payer: Self-pay | Admitting: Family Medicine

## 2015-02-04 ENCOUNTER — Other Ambulatory Visit: Payer: Self-pay | Admitting: Family Medicine

## 2015-02-14 ENCOUNTER — Telehealth: Payer: Self-pay | Admitting: Family Medicine

## 2015-02-14 DIAGNOSIS — N183 Chronic kidney disease, stage 3 unspecified: Secondary | ICD-10-CM

## 2015-02-14 NOTE — Telephone Encounter (Signed)
Pt requesting lab results from last visit

## 2015-02-14 NOTE — Telephone Encounter (Signed)
Will forward to MD. Babbette Dalesandro,CMA  

## 2015-02-17 DIAGNOSIS — N183 Chronic kidney disease, stage 3 unspecified: Secondary | ICD-10-CM | POA: Insufficient documentation

## 2015-02-17 NOTE — Telephone Encounter (Signed)
Attempted phone call, ran but no voicemail to leave a message. Pt results for cholesterol check were normal, electrolytes normal, kidney function about the same as it has been over the past 2 years (stage 3 kidney disease, but nothing to change based on this). If patient returns call you can relay this information and let me know if patient requests to speak with me. Otherwise I will try again tomorrow. -Dr. Lamar Benes

## 2015-02-17 NOTE — Telephone Encounter (Signed)
Pt is calling back again and would like someone to go over her lab results. jw

## 2015-02-19 ENCOUNTER — Encounter: Payer: Self-pay | Admitting: Family Medicine

## 2015-02-19 ENCOUNTER — Telehealth: Payer: Self-pay | Admitting: Family Medicine

## 2015-02-19 NOTE — Telephone Encounter (Addendum)
Called again and no answer. Left VM to return call to clinic. -Dr. Lamar Benes  Addendum -- will send letter.

## 2015-02-19 NOTE — Telephone Encounter (Signed)
Pt called back and was given her test results and she understood. jw

## 2015-05-23 ENCOUNTER — Encounter: Payer: Self-pay | Admitting: Family Medicine

## 2015-05-25 ENCOUNTER — Other Ambulatory Visit: Payer: Self-pay | Admitting: Family Medicine

## 2015-06-03 ENCOUNTER — Ambulatory Visit (INDEPENDENT_AMBULATORY_CARE_PROVIDER_SITE_OTHER): Payer: PPO | Admitting: Family Medicine

## 2015-06-03 ENCOUNTER — Encounter: Payer: Self-pay | Admitting: Family Medicine

## 2015-06-03 VITALS — BP 148/58 | HR 53 | Temp 98.5°F | Wt 154.0 lb

## 2015-06-03 DIAGNOSIS — Z23 Encounter for immunization: Secondary | ICD-10-CM | POA: Diagnosis not present

## 2015-06-03 DIAGNOSIS — L821 Other seborrheic keratosis: Secondary | ICD-10-CM | POA: Diagnosis not present

## 2015-06-03 DIAGNOSIS — I10 Essential (primary) hypertension: Secondary | ICD-10-CM

## 2015-06-03 MED ORDER — CARVEDILOL 12.5 MG PO TABS: 25.0000 mg | ORAL_TABLET | Freq: Two times a day (BID) | ORAL | Status: DC

## 2015-06-03 NOTE — Progress Notes (Signed)
   Subjective:    Patient ID: Meredith Owens, female    DOB: 1941-07-25, 74 y.o.   MRN: [id number]  HPI  CC: follow up  # Hypertension:  started on timolol drops for glaucoma. Has not had any palpitations or feeling of slow heart rate.   Brings in home BP log: all in 100-120s.   Tested home BP cuff in office today, consistent with measured reading, 154/84 ROS: no HA (not true HA, but has had some quick shooting pains), no dizziness  # Skin lesions:   Still present, showing up in random spots  Spot on left wrist has been there "for ever", usually stays for a while  Not changing in size, color, doesn't actually blister ROS: no other rashes, no fevers  Social Hx: never smoker  Review of Systems   See HPI for ROS.   Past medical history, surgical, family, and social history reviewed and updated in the EMR as appropriate. Objective:  BP 163/62 mmHg  Pulse 53  Temp(Src) 98.5 F (36.9 C) (Oral)  Wt 154 lb (69.854 kg) Vitals and nursing note reviewed  General: NAD CV: RRR, normal s1s2, no murmurs/rub/gallop. 2+ radial pulses bilaterally  Resp: clear to auscultation bilaterally, normal effort Ext: no edema or cyanosis Skin: red punctate lesion located on palmar side of left wrist. There is a red erythematous papular lesion on the right upper forearm that seems most consistent with bug bite. Middle of chest there is a stuck on hyperpigmented lesion most consistent with SK.  Assessment & Plan:  HYPERTENSION, BENIGN SYSTEMIC Home BP brought in today and compared to clinic readings; cuff seems consistent with clinic SBP 163 and home cuff 154. Her home readings are all within normal range, and actually slightly low in 100s SBP on several days. This basically confirms she has an aspect of whitecoat hypertension. She was started on timolol drops for glaucoma. Will decrease coreg to 12.5mg  twice daily with her home BP readings and HR consistently in 50s. Follow up 3 months.  Skin  lesion of chest wall Stable. Likely SK. Other lesions she points out also are benign appearing. Recommended observation. Follow up if changes are noted.

## 2015-06-03 NOTE — Patient Instructions (Signed)
Decrease your Carvedilol (coreg) to 12.5mg  twice a day. You can use the 25mg  tablets you have, just cut them in half. When you run out you can use the new prescription I sent to the pharmacy.  Keep an eye on the skin spots, if they change come back to the clinic.

## 2015-06-03 NOTE — Assessment & Plan Note (Addendum)
Home BP brought in today and compared to clinic readings; cuff seems consistent with clinic SBP 163 and home cuff 154. Her home readings are all within normal range, and actually slightly low in 100s SBP on several days. This basically confirms she has an aspect of whitecoat hypertension. She was started on timolol drops for glaucoma. Will decrease coreg to 12.5mg  twice daily with her home BP readings and HR consistently in 50s. Follow up 3 months.

## 2015-06-03 NOTE — Assessment & Plan Note (Addendum)
Stable. Likely SK. Other lesions she points out also are benign appearing. Recommended observation. Follow up if changes are noted.

## 2015-07-30 ENCOUNTER — Other Ambulatory Visit: Payer: Self-pay

## 2015-07-30 DIAGNOSIS — Z1231 Encounter for screening mammogram for malignant neoplasm of breast: Secondary | ICD-10-CM

## 2015-07-31 ENCOUNTER — Other Ambulatory Visit: Payer: Self-pay | Admitting: Family Medicine

## 2015-09-03 ENCOUNTER — Ambulatory Visit
Admission: RE | Admit: 2015-09-03 | Discharge: 2015-09-03 | Disposition: A | Discharge status: Home / Self Care | Payer: PPO | Source: Ambulatory Visit

## 2015-09-03 ENCOUNTER — Ambulatory Visit: Payer: PPO

## 2015-09-03 DIAGNOSIS — Z1231 Encounter for screening mammogram for malignant neoplasm of breast: Secondary | ICD-10-CM | POA: Diagnosis not present

## 2015-09-19 ENCOUNTER — Ambulatory Visit (INDEPENDENT_AMBULATORY_CARE_PROVIDER_SITE_OTHER): Payer: PPO | Admitting: Family Medicine

## 2015-09-19 ENCOUNTER — Encounter: Payer: Self-pay | Admitting: Family Medicine

## 2015-09-19 VITALS — BP 186/73 | HR 58 | Temp 98.2°F | Wt 151.0 lb

## 2015-09-19 DIAGNOSIS — I10 Essential (primary) hypertension: Secondary | ICD-10-CM | POA: Diagnosis not present

## 2015-09-19 DIAGNOSIS — L989 Disorder of the skin and subcutaneous tissue, unspecified: Secondary | ICD-10-CM

## 2015-09-19 NOTE — Patient Instructions (Signed)
Come back when you are ready and we will do a punch biopsy (or possibly a shave biopsy) of the spot on your chest.  Keep tracking your blood pressures as you are doing.   Vitamin D3 -- 1000 units daily, try and get the liquid drops (solution)

## 2015-09-19 NOTE — Progress Notes (Signed)
   Subjective:    Patient ID: Meredith Owens, female    DOB: Mar 09, 1941, 75 y.o.   MRN: [id number]  HPI  CC: follow up for blood pressure  # Hypertension:  Brings in home log from November through today. BP readings 108-168/51-71. Single reading of 168, next highest 146. Most are in 110-130 range.  Says the highest reading of 168/71 was after a lot of physical activity/cleaning  Took BP medicines today ROS: no CP, no SOB, no HA  # Skin lesion on chest  Initial spot has stayed about the same as last time she had asked about this almost a year ago. Does not really bother her, not really painful or tender, doesn't drain  Has a few other spots appear on her chest as well  Would be interested in biopsy  Social Hx: never smoker  Review of Systems   See HPI for ROS.   Past medical history, surgical, family, and social history reviewed and updated in the EMR as appropriate. Objective:  BP 186/73 mmHg  Pulse 58  Temp(Src) 98.2 F (36.8 C) (Oral)  Wt 151 lb (68.493 kg) Vitals and nursing note reviewed  General: no apparent distress CV: normal rate, regular rhythm, no murmur appreciated. 2+ radial pulses bilaterally  Resp: clear to auscultation bilaterally, normal effort Skin: several hyperpigmented lesions on chest as seen below        Assessment & Plan:  HYPERTENSION, BENIGN SYSTEMIC As pretty much expected BP in office was elevated, however her home readings all appear to be well controlled accounting for her BP cuff being about 10 higher than clinic BP. Follow up 3-6 months.  Skin lesion of chest wall Favor benign etiology (suspect SK) however patient concerned about multiple appearing. Discussed possibly biopsy which she seems interested in. If she would like to do this asked her to scheduled follow up visit as needed.

## 2015-09-21 DIAGNOSIS — L989 Disorder of the skin and subcutaneous tissue, unspecified: Secondary | ICD-10-CM | POA: Insufficient documentation

## 2015-09-21 NOTE — Assessment & Plan Note (Signed)
Favor benign etiology (suspect SK) however patient concerned about multiple appearing. Discussed possibly biopsy which she seems interested in. If she would like to do this asked her to scheduled follow up visit as needed.

## 2015-09-21 NOTE — Assessment & Plan Note (Signed)
As pretty much expected BP in office was elevated, however her home readings all appear to be well controlled accounting for her BP cuff being about 10 higher than clinic BP. Follow up 3-6 months.

## 2015-09-23 ENCOUNTER — Telehealth: Payer: Self-pay | Admitting: Family Medicine

## 2015-09-23 NOTE — Telephone Encounter (Signed)
Pt states that PCP wanted to decrease dosage of carvedilol to half of what she is taking now.  Pt needs a new script sent to the pharmacy so that this can be completed. Sadie Reynolds, ASA

## 2015-09-23 NOTE — Telephone Encounter (Signed)
Will forward to PCP regarding change in dosage of carvedilol.  Derl Barrow, RN

## 2015-09-24 MED ORDER — CARVEDILOL 12.5 MG PO TABS: 12.5000 mg | ORAL_TABLET | Freq: Two times a day (BID) | ORAL | Status: DC

## 2015-09-24 NOTE — Telephone Encounter (Signed)
Spoke with patient and sent in new rx for 12.5mg  tablets (she was cutting 25mg  tablet in half). Clarified with her exactly what she should take (1 tablet of 12.5mg  twice daily). -Dr. Lamar Benes

## 2015-10-14 DIAGNOSIS — H401121 Primary open-angle glaucoma, left eye, mild stage: Secondary | ICD-10-CM | POA: Diagnosis not present

## 2015-10-14 DIAGNOSIS — H11152 Pinguecula, left eye: Secondary | ICD-10-CM | POA: Diagnosis not present

## 2015-10-14 DIAGNOSIS — H18412 Arcus senilis, left eye: Secondary | ICD-10-CM | POA: Diagnosis not present

## 2015-10-14 DIAGNOSIS — H11422 Conjunctival edema, left eye: Secondary | ICD-10-CM | POA: Diagnosis not present

## 2015-10-14 DIAGNOSIS — Z961 Presence of intraocular lens: Secondary | ICD-10-CM | POA: Diagnosis not present

## 2015-10-14 DIAGNOSIS — H26492 Other secondary cataract, left eye: Secondary | ICD-10-CM | POA: Diagnosis not present

## 2015-10-14 DIAGNOSIS — H04122 Dry eye syndrome of left lacrimal gland: Secondary | ICD-10-CM | POA: Diagnosis not present

## 2015-10-29 ENCOUNTER — Ambulatory Visit (INDEPENDENT_AMBULATORY_CARE_PROVIDER_SITE_OTHER): Payer: PPO | Admitting: Family Medicine

## 2015-10-29 VITALS — BP 169/64 | HR 61 | Temp 98.5°F | Wt 151.0 lb

## 2015-10-29 DIAGNOSIS — D239 Other benign neoplasm of skin, unspecified: Secondary | ICD-10-CM | POA: Diagnosis not present

## 2015-10-29 DIAGNOSIS — Z961 Presence of intraocular lens: Secondary | ICD-10-CM | POA: Diagnosis not present

## 2015-10-29 DIAGNOSIS — H401122 Primary open-angle glaucoma, left eye, moderate stage: Secondary | ICD-10-CM | POA: Diagnosis not present

## 2015-10-29 DIAGNOSIS — H18412 Arcus senilis, left eye: Secondary | ICD-10-CM | POA: Diagnosis not present

## 2015-10-29 DIAGNOSIS — L988 Other specified disorders of the skin and subcutaneous tissue: Secondary | ICD-10-CM | POA: Diagnosis not present

## 2015-10-29 DIAGNOSIS — H11152 Pinguecula, left eye: Secondary | ICD-10-CM | POA: Diagnosis not present

## 2015-10-29 NOTE — Progress Notes (Addendum)
Patient ID: Meredith Owens, female   DOB: 11/21/1940, 75 y.o.   MRN: [id number]  CC: :Bump on the chest  HPI: :C/O dark bump on her chest which has been on going for months. She denies any recent change in size but it becomes irritated when touched at times. She is uncertain how it started. Denies previous injury or trauma to the area. She is here to get this checked out and removed.  Review of Systems  Constitutional: Negative.   Respiratory: Negative.   Cardiovascular: Negative.   Musculoskeletal:       Skin lesion   Physical Exam  Constitutional: She appears well-developed. No distress.  Skin: Skin is warm. Lesion noted. No rash noted.     Nursing note and vitals reviewed.  Assessment/Plan: Likely Dermatofibroma. I discussed conservative measures vs excision. Patient opted for excision to ensure this is not cancer. Verbal and informed consent obtained.   Excisional Biopsy Procedure Note  Pre-operative Diagnosis: Skin lesion/Dermatofibroma.  Post-operative Diagnosis: same  Locations: Central chest.  Indications: Concern for malignancy although unlikely.  Anesthesia: Lidocaine 1% with epinephrine without added sodium bicarbonate  Procedure Details  The risks, benefits, indications, potential complications, and alternatives were explained to the patient and informed consent obtained.  The lesion and surrounding area was given a sterile prep using alcohol and betadine and draped in the usual sterile fashion. A scalpel was used to excise an elliptical area of skin approximately 1cm by 1cm. The wound was closed with 4-0 Nylon using simple interrupted stitches. Antibiotic ointment and a sterile dressing applied. The specimen was sent for pathologic examination. The patient tolerated the procedure well.  EBL: minimal  Findings: Dermatofibroma  Condition: Stable  Complications:  None  Plan: 1. Instructed to keep the wound dry and covered for 24-48 hours and clean  thereafter. 2. Warning signs of infection were reviewed.   3. Recommended that the patient use OTC acetaminophen as needed for pain.  4. Return for suture removal in 7-10 days.

## 2015-10-29 NOTE — Patient Instructions (Signed)
Please return to the nurse in 7-10 days for suture removal. Call if having any bleeding or discharge from the incision site.   Incision Care An incision is when a surgeon cuts into your body. After surgery, the incision needs to be cared for properly to prevent infection.  HOW TO CARE FOR YOUR INCISION  Take medicines only as directed by your health care provider.  There are many different ways to close and cover an incision, including stitches, skin glue, and adhesive strips. Follow your health care provider's instructions on:  Incision care.  Bandage (dressing) changes and removal.  Incision closure removal.  Do not take baths, swim, or use a hot tub until your health care provider approves. You may shower as directed by your health care provider.  Resume your normal diet and activities as directed.  Use anti-itch medicine (such as an antihistamine) as directed by your health care provider. The incision may itch while it is healing. Do not pick or scratch at the incision.  Drink enough fluid to keep your urine clear or pale yellow. SEEK MEDICAL CARE IF:   You have drainage, redness, swelling, or pain at your incision site.  You have muscle aches, chills, or a general ill feeling.  You notice a bad smell coming from the incision or dressing.  Your incision edges separate after the sutures, staples, or skin adhesive strips have been removed.  You have persistent nausea or vomiting.  You have a fever.  You are dizzy. SEEK IMMEDIATE MEDICAL CARE IF:   You have a rash.  You faint.  You have difficulty breathing. MAKE SURE YOU:   Understand these instructions.  Will watch your condition.  Will get help right away if you are not doing well or get worse.   This information is not intended to replace advice given to you by your health care provider. Make sure you discuss any questions you have with your health care provider.   Document Released: 02/12/2005 Document  Revised: 08/16/2014 Document Reviewed: 09/19/2013 Elsevier Interactive Patient Education [employer].

## 2015-10-31 ENCOUNTER — Telehealth: Payer: Self-pay | Admitting: Family Medicine

## 2015-10-31 NOTE — Telephone Encounter (Signed)
LM for amy that there in no past report in our system. Meredith Owens,CMA

## 2015-10-31 NOTE — Telephone Encounter (Signed)
Meredith Owens is with [facility]. She needs a previous pathology report (if one exists) on the chest area.  Please call Meredith Owens if there isnt one. If there is one, please fax it to [phone].

## 2015-11-03 ENCOUNTER — Telehealth: Payer: Self-pay | Admitting: Family Medicine

## 2015-11-03 NOTE — Telephone Encounter (Signed)
I called and discussed pathology report with patient. She denies any concern from surgery site. She has an appointment in 2 days for sutural removal. F/U as needed.

## 2015-11-05 ENCOUNTER — Ambulatory Visit (INDEPENDENT_AMBULATORY_CARE_PROVIDER_SITE_OTHER): Payer: PPO | Admitting: *Deleted

## 2015-11-05 DIAGNOSIS — L989 Disorder of the skin and subcutaneous tissue, unspecified: Secondary | ICD-10-CM | POA: Diagnosis not present

## 2015-11-05 NOTE — Progress Notes (Signed)
Patient in today for suture removal. Six sutures removed from chest, area healing will with slight scabbing. Applied triple antibiotic ointment and bandaid over area. Patient tolerated well, precepted with Dr Lindell Noe.

## 2015-11-10 ENCOUNTER — Ambulatory Visit (INDEPENDENT_AMBULATORY_CARE_PROVIDER_SITE_OTHER): Payer: PPO | Admitting: Family Medicine

## 2015-11-10 ENCOUNTER — Encounter: Payer: Self-pay | Admitting: Family Medicine

## 2015-11-10 VITALS — Temp 98.1°F | Wt 148.1 lb

## 2015-11-10 DIAGNOSIS — L989 Disorder of the skin and subcutaneous tissue, unspecified: Secondary | ICD-10-CM | POA: Diagnosis not present

## 2015-11-10 DIAGNOSIS — I10 Essential (primary) hypertension: Secondary | ICD-10-CM | POA: Diagnosis not present

## 2015-11-10 NOTE — Assessment & Plan Note (Signed)
As expected still elevated in clinic, home BP readings stable = whitecoat hypertension. Continue current medications.

## 2015-11-10 NOTE — Progress Notes (Signed)
   Subjective:    Patient ID: Meredith Owens, female    DOB: 10-10-1940, 75 y.o.   MRN: [id number]  HPI  CC: biopsy follow up   # Skin excision follow up :  Healing well  No significant swelling, redness, drainage, pain  Biopsy results were benign ROS: no fevers  # Hypertension  Taking medications as prescribed  Most home measurements in 110-130s SBP  No CP, no SOB  # Glaucoma left eye  Followed by Dr. Harrison Mons  Added another eye drop - lumigan  Uses moisturizing drops and ointment.   Social Hx: never smoker  Review of Systems   See HPI for ROS.   Past medical history, surgical, family, and social history reviewed and updated in the EMR as appropriate. Objective:  Temp(Src) 98.1 F (36.7 C) (Oral)  Wt 148 lb 1.6 oz (67.178 kg) Vitals and nursing note reviewed  General: no apparent distress  Skin: well healing incision central chest, small scab present, no surrounding erythema or fluctuance.  Assessment & Plan:  Essential hypertension, benign As expected still elevated in clinic, home BP readings stable = whitecoat hypertension. Continue current medications.   Skin lesion of chest wall Excisional biopsy done, path came back as benign pathology likely hypertrophic scar. Healing well. No additional follow up needed.

## 2015-11-10 NOTE — Patient Instructions (Signed)
Come back in about 2 months for diabetes follow up

## 2015-11-10 NOTE — Assessment & Plan Note (Signed)
Excisional biopsy done, path came back as benign pathology likely hypertrophic scar. Healing well. No additional follow up needed.

## 2016-01-20 ENCOUNTER — Encounter: Payer: Self-pay | Admitting: Family Medicine

## 2016-01-20 ENCOUNTER — Ambulatory Visit (INDEPENDENT_AMBULATORY_CARE_PROVIDER_SITE_OTHER): Payer: PPO | Admitting: Family Medicine

## 2016-01-20 VITALS — BP 188/66 | HR 65 | Temp 98.6°F | Wt 152.0 lb

## 2016-01-20 DIAGNOSIS — R5382 Chronic fatigue, unspecified: Secondary | ICD-10-CM

## 2016-01-20 DIAGNOSIS — L989 Disorder of the skin and subcutaneous tissue, unspecified: Secondary | ICD-10-CM

## 2016-01-20 DIAGNOSIS — R351 Nocturia: Secondary | ICD-10-CM | POA: Insufficient documentation

## 2016-01-20 DIAGNOSIS — I10 Essential (primary) hypertension: Secondary | ICD-10-CM | POA: Diagnosis not present

## 2016-01-20 DIAGNOSIS — R531 Weakness: Secondary | ICD-10-CM | POA: Insufficient documentation

## 2016-01-20 HISTORY — DX: Chronic fatigue, unspecified: R53.82

## 2016-01-20 LAB — BASIC METABOLIC PANEL WITH GFR
BUN: 16 mg/dL (ref 7–25)
CO2: 27 mmol/L (ref 20–31)
CREATININE: 1.09 mg/dL — AB (ref 0.60–0.93)
Calcium: 9.5 mg/dL (ref 8.6–10.4)
Chloride: 103 mmol/L (ref 98–110)
GFR, EST AFRICAN AMERICAN: 58 mL/min — AB (ref >=60)
GFR, Est Non African American: 50 mL/min — ABNORMAL LOW (ref >=60)
GLUCOSE: 91 mg/dL (ref 65–99)
Potassium: 3.7 mmol/L (ref 3.5–5.3)
Sodium: 136 mmol/L (ref 135–146)

## 2016-01-20 LAB — CBC
HCT: 34.7 % — ABNORMAL LOW (ref 35.0–45.0)
HEMOGLOBIN: 11.4 g/dL — AB (ref 11.7–15.5)
MCH: 30.9 pg (ref 27.0–33.0)
MCHC: 32.9 g/dL (ref 32.0–36.0)
MCV: 94 fL (ref 80.0–100.0)
MPV: 9.9 fL (ref 7.5–12.5)
Platelets: 253 10*3/uL (ref 140–400)
RBC: 3.69 MIL/uL — ABNORMAL LOW (ref 3.80–5.10)
RDW: 14.1 % (ref 11.0–15.0)
WBC: 6.8 10*3/uL (ref 3.8–10.8)

## 2016-01-20 NOTE — Assessment & Plan Note (Signed)
2-3 times at night. Does not describe UTI symptoms otherwise. Taking losartan-hctz in the morning. She does take dorzolamide eye drops later in the day which could contribute. She is most bothered by interrupted sleep, but does not want to start medicines for this. Discussed drinking less fluid prior to going to bed, not drinking caffeine before bed. Follow up as needed.

## 2016-01-20 NOTE — Assessment & Plan Note (Signed)
Second lesion noticed below her first one. This new one appears consistent in appearance with the prior that came back as hypertrophic scar. It is well circumscribed. Patient still experiencing some pain from last lesion removal, she does not want to remove this and would prefer to watch and wait; follow up if it is growing.

## 2016-01-20 NOTE — Progress Notes (Signed)
   Subjective:    Patient ID: Meredith Owens, female    DOB: [birth year], 75 y.o.   MRN: [id number]  HPI  CC: follow up   # Nocturia:  Noticed in past few months  Waking up at least twice a night  Bothering her because it is hard to get back to sleep. She doesn't want to start medicines ROS: no dysuria, no increase daytime frequency  # Skin lesion  Located along braline, midline torso.   Says she first noticed it after she had the last one removed  Not growing in size, not painful or itchy  # Hypertension  Took medicines, no missed doses  Noticing voiding more frequently at night ROS: no CP, no SOB  Social Hx: never smoker  Review of Systems   See HPI for ROS.   Past medical history, surgical, family, and social history reviewed and updated in the EMR as appropriate. Objective:  BP 188/66 mmHg  Pulse 65  Temp(Src) 98.6 F (37 C) (Oral)  Wt 152 lb (68.947 kg)  SpO2 94% Vitals and nursing note reviewed  General: no apparent distress  CV: normal rate, regular rhythm, no murmurs, rubs or gallop  Resp: clear to auscultation bilaterally normal effort Skin: between breasts at braline, in midline there is a 7mm well circumscribed raised lesion, nontender  Assessment & Plan:  Skin lesion of chest wall Second lesion noticed below her first one. This new one appears consistent in appearance with the prior that came back as hypertrophic scar. It is well circumscribed. Patient still experiencing some pain from last lesion removal, she does not want to remove this and would prefer to watch and wait; follow up if it is growing.  Nocturia 2-3 times at night. Does not describe UTI symptoms otherwise. Taking losartan-hctz in the morning. She does take dorzolamide eye drops later in the day which could contribute. She is most bothered by interrupted sleep, but does not want to start medicines for this. Discussed drinking less fluid prior to going to bed, not drinking caffeine  before bed. Follow up as needed.    Essential hypertension, benign Home BP measurements are all at goal. Re-check yearly BMP.  Chronic fatigue Still complaining of some of this. Will check CBC with labs. She does have a history of slightly low hgb.     Return in about 3 months (around 04/21/2016), or if symptoms worsen or fail to improve.

## 2016-01-20 NOTE — Assessment & Plan Note (Signed)
Still complaining of some of this. Will check CBC with labs. She does have a history of slightly low hgb.

## 2016-01-20 NOTE — Assessment & Plan Note (Signed)
Home BP measurements are all at goal. Re-check yearly BMP.

## 2016-01-20 NOTE — Patient Instructions (Signed)
Peeing at night:  Work on trying to limit drinks, ESPECIALLY THE TEA. Don't drink any caffeineted drinks after 6pm.

## 2016-01-29 ENCOUNTER — Encounter: Payer: Self-pay | Admitting: Family Medicine

## 2016-01-29 DIAGNOSIS — H04122 Dry eye syndrome of left lacrimal gland: Secondary | ICD-10-CM | POA: Diagnosis not present

## 2016-01-29 DIAGNOSIS — Z970 Presence of artificial eye: Secondary | ICD-10-CM | POA: Diagnosis not present

## 2016-01-29 DIAGNOSIS — Z961 Presence of intraocular lens: Secondary | ICD-10-CM | POA: Diagnosis not present

## 2016-01-29 DIAGNOSIS — H01004 Unspecified blepharitis left upper eyelid: Secondary | ICD-10-CM | POA: Diagnosis not present

## 2016-01-29 DIAGNOSIS — H11152 Pinguecula, left eye: Secondary | ICD-10-CM | POA: Diagnosis not present

## 2016-01-29 DIAGNOSIS — H401222 Low-tension glaucoma, left eye, moderate stage: Secondary | ICD-10-CM | POA: Diagnosis not present

## 2016-01-29 DIAGNOSIS — Z9842 Cataract extraction status, left eye: Secondary | ICD-10-CM | POA: Diagnosis not present

## 2016-01-29 DIAGNOSIS — H18412 Arcus senilis, left eye: Secondary | ICD-10-CM | POA: Diagnosis not present

## 2016-02-25 ENCOUNTER — Encounter: Payer: Self-pay | Admitting: Gastroenterology

## 2016-03-08 ENCOUNTER — Encounter: Payer: Self-pay | Admitting: Gastroenterology

## 2016-03-29 DIAGNOSIS — H401221 Low-tension glaucoma, left eye, mild stage: Secondary | ICD-10-CM | POA: Diagnosis not present

## 2016-04-08 ENCOUNTER — Ambulatory Visit (INDEPENDENT_AMBULATORY_CARE_PROVIDER_SITE_OTHER): Payer: PPO | Admitting: Family Medicine

## 2016-04-08 ENCOUNTER — Ambulatory Visit (INDEPENDENT_AMBULATORY_CARE_PROVIDER_SITE_OTHER): Payer: PPO | Admitting: *Deleted

## 2016-04-08 ENCOUNTER — Encounter: Payer: Self-pay | Admitting: *Deleted

## 2016-04-08 ENCOUNTER — Encounter: Payer: Self-pay | Admitting: Family Medicine

## 2016-04-08 VITALS — BP 150/51 | HR 54 | Temp 98.0°F | Ht 61.5 in | Wt 151.0 lb

## 2016-04-08 VITALS — BP 142/70 | HR 55 | Temp 98.0°F | Ht 61.5 in | Wt 151.0 lb

## 2016-04-08 DIAGNOSIS — L853 Xerosis cutis: Secondary | ICD-10-CM | POA: Diagnosis not present

## 2016-04-08 DIAGNOSIS — Z0000 Encounter for general adult medical examination without abnormal findings: Secondary | ICD-10-CM

## 2016-04-08 DIAGNOSIS — I10 Essential (primary) hypertension: Secondary | ICD-10-CM

## 2016-04-08 DIAGNOSIS — Z23 Encounter for immunization: Secondary | ICD-10-CM

## 2016-04-08 NOTE — Progress Notes (Signed)
    Subjective:    Patient ID: Meredith Owens, female    DOB: [birth year], 75 y.o.   MRN: [id number]   Meredith Owens is a 75 year old here to meet new PCP and follow up on chronic health issues. She has an annual wellness visit today after this visit. Doing well, no complaints at this time. Would like flu shot today. Has colonoscopy set up for end of September.  HTN Doing well with medications, no chest pain, shortness of breath, headaches.   Glaucoma Saw eye specialist 8/21 for glaucoma and is following closely with eye doctor. Doing well with eye drops. No current pain or concerns  Dry Skin Has chronically dry skin, would like advice on non-irritating soaps/lotions to use. Has tried several things in the past but still feels like skin is dry. Has a few itchy spots that come and go. No concerning lesions. Has multiple skin tags on arms and back but they do not bother her.  Smoking status reviewed- non-smoker  Review of Systems- See HPI   Objective:  BP (!) 142/70   Pulse (!) 55   Temp 98 F (36.7 C) (Oral)   Ht 5' 1.5" (1.562 m)   Wt 151 lb (68.5 kg)   SpO2 98%   BMI 28.07 kg/m  Vitals and nursing note reviewed  General: well nourished, in NAD HEENT: normocephalic, no scleral icterus or conjunctival pallor, no nasal discharge, moist mucous membranes Cardiac: RRR, clear S1 and S2, no murmurs, rubs, or gallops Respiratory: clear to auscultation bilaterally, no increased work of breathing Abdomen: soft, nontender, nondistended, no masses or organomegaly. Extremities: no edema or cyanosis. Warm, well perfused. Skin: warm and dry, multiple skin tags on arms, back, no worrisome lesions  Neuro: alert and oriented, no focal deficits   Assessment & Plan:    Dry skin  Has always had dry sensitive skin. Tried several soaps/lotions but feel they make it worse or do not help.  -recommended Cetaphil soap for sensitive skin, non-scented -recommended thick lotions apply after bathing,  recommended Aveeno or Eucerin creams -patient will try these and reassess at next visit  Essential hypertension, benign  BP at goal today, no changes needed  -follow up in 3-4 months  Return in about 3 months (around 07/08/2016).  [name], DO Family Medicine Resident PGY-1
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
## 2016-04-08 NOTE — Assessment & Plan Note (Signed)
  Has always had dry sensitive skin. Tried several soaps/lotions but feel they make it worse or do not help.  -recommended Cetaphil soap for sensitive skin, non-scented -recommended thick lotions apply after bathing, recommended Aveeno or Eucerin creams -patient will try these and reassess at next visit

## 2016-04-08 NOTE — Assessment & Plan Note (Signed)
  BP at goal today, no changes needed  -follow up in 3-4 months

## 2016-04-08 NOTE — Patient Instructions (Signed)
Bone Densitometry Bone densitometry is an imaging test that uses a special X-ray to measure the amount of calcium and other minerals in your bones (bone density). This test is also known as a bone mineral density test or dual-energy X-ray absorptiometry (DXA). The test can measure bone density at your hip and your spine. It is similar to having a regular X-ray. You may have this test to:  Diagnose a condition that causes weak or thin bones (osteoporosis).  Predict your risk of a broken bone (fracture).  Determine how well osteoporosis treatment is working. LET YOUR HEALTH CARE PROVIDER KNOW ABOUT:  Any allergies you have.  All medicines you are taking, including vitamins, herbs, eye drops, creams, and over-the-counter medicines.  Previous problems you or members of your family have had with the use of anesthetics.  Any blood disorders you have.  Previous surgeries you have had.  Medical conditions you have.  Possibility of pregnancy.  Any other medical test you had within the previous 14 days that used contrast material. RISKS AND COMPLICATIONS Generally, this is a safe procedure. However, problems can occur and may include the following:  This test exposes you to a very small amount of radiation.  The risks of radiation exposure may be greater to unborn children. BEFORE THE PROCEDURE  Do not take any calcium supplements for 24 hours before having the test. You can otherwise eat and drink what you usually do.  Take off all metal jewelry, eyeglasses, dental appliances, and any other metal objects. PROCEDURE  You may lie on an exam table. There will be an X-ray generator below you and an imaging device above you.  Other devices, such as boxes or braces, may be used to position your body properly for the scan.  You will need to lie still while the machine slowly scans your body.  The images will show up on a computer monitor. AFTER THE PROCEDURE You may need more testing  at a later time.   This information is not intended to replace advice given to you by your health care provider. Make sure you discuss any questions you have with your health care provider.   Document Released: 08/17/2004 Document Revised: 08/16/2014 Document Reviewed: 01/03/2014 Elsevier Interactive Patient Education 2016 [employer]   Fall Prevention in the Home  Falls can cause injuries. They can happen to people of all ages. There are many things you can do to make your home safe and to help prevent falls.  WHAT CAN I DO ON THE OUTSIDE OF MY HOME?  Regularly fix the edges of walkways and driveways and fix any cracks.  Remove anything that might make you trip as you walk through a door, such as a raised step or threshold.  Trim any bushes or trees on the path to your home.  Use bright outdoor lighting.  Clear any walking paths of anything that might make someone trip, such as rocks or tools.  Regularly check to see if handrails are loose or broken. Make sure that both sides of any steps have handrails.  Any raised decks and porches should have guardrails on the edges.  Have any leaves, snow, or ice cleared regularly.  Use sand or salt on walking paths during winter.  Clean up any spills in your garage right away. This includes oil or grease spills. WHAT CAN I DO IN THE BATHROOM?   Use night lights.  Install grab bars by the toilet and in the tub and shower. Do not use   towel bars as grab bars.  Use non-skid mats or decals in the tub or shower.  If you need to sit down in the shower, use a plastic, non-slip stool.  Keep the floor dry. Clean up any water that spills on the floor as soon as it happens.  Remove soap buildup in the tub or shower regularly.  Attach bath mats securely with double-sided non-slip rug tape.  Do not have throw rugs and other things on the floor that can make you trip. WHAT CAN I DO IN THE BEDROOM?  Use night lights.  Make sure that you  have a light by your bed that is easy to reach.  Do not use any sheets or blankets that are too big for your bed. They should not hang down onto the floor.  Have a firm chair that has side arms. You can use this for support while you get dressed.  Do not have throw rugs and other things on the floor that can make you trip. WHAT CAN I DO IN THE KITCHEN?  Clean up any spills right away.  Avoid walking on wet floors.  Keep items that you use a lot in easy-to-reach places.  If you need to reach something above you, use a strong step stool that has a grab bar.  Keep electrical cords out of the way.  Do not use floor polish or wax that makes floors slippery. If you must use wax, use non-skid floor wax.  Do not have throw rugs and other things on the floor that can make you trip. WHAT CAN I DO WITH MY STAIRS?  Do not leave any items on the stairs.  Make sure that there are handrails on both sides of the stairs and use them. Fix handrails that are broken or loose. Make sure that handrails are as long as the stairways.  Check any carpeting to make sure that it is firmly attached to the stairs. Fix any carpet that is loose or worn.  Avoid having throw rugs at the top or bottom of the stairs. If you do have throw rugs, attach them to the floor with carpet tape.  Make sure that you have a light switch at the top of the stairs and the bottom of the stairs. If you do not have them, ask someone to add them for you. WHAT ELSE CAN I DO TO HELP PREVENT FALLS?  Wear shoes that:  Do not have high heels.  Have rubber bottoms.  Are comfortable and fit you well.  Are closed at the toe. Do not wear sandals.  If you use a stepladder:  Make sure that it is fully opened. Do not climb a closed stepladder.  Make sure that both sides of the stepladder are locked into place.  Ask someone to hold it for you, if possible.  Clearly mark and make sure that you can see:  Any grab bars or  handrails.  First and last steps.  Where the edge of each step is.  Use tools that help you move around (mobility aids) if they are needed. These include:  Canes.  Walkers.  Scooters.  Crutches.  Turn on the lights when you go into a dark area. Replace any light bulbs as soon as they burn out.  Set up your furniture so you have a clear path. Avoid moving your furniture around.  If any of your floors are uneven, fix them.  If there are any pets around you, be aware of where  they are.  Review your medicines with your doctor. Some medicines can make you feel dizzy. This can increase your chance of falling. Ask your doctor what other things that you can do to help prevent falls.   This information is not intended to replace advice given to you by your health care provider. Make sure you discuss any questions you have with your health care provider.   Document Released: 05/22/2009 Document Revised: 12/10/2014 Document Reviewed: 08/30/2014 Elsevier Interactive Patient Education 2016 [place] Maintenance, Female Adopting a healthy lifestyle and getting preventive care can go a long way to promote health and wellness. Talk with your health care provider about what schedule of regular examinations is right for you. This is a good chance for you to check in with your provider about disease prevention and staying healthy. In between checkups, there are plenty of things you can do on your own. Experts have done a lot of research about which lifestyle changes and preventive measures are most likely to keep you healthy. Ask your health care provider for more information. WEIGHT AND DIET  Eat a healthy diet  Be sure to include plenty of vegetables, fruits, low-fat dairy products, and lean protein.  Do not eat a lot of foods high in solid fats, added sugars, or salt.  Get regular exercise. This is one of the most important things you can do for your health.  Most adults  should exercise for at least 150 minutes each week. The exercise should increase your heart rate and make you sweat (moderate-intensity exercise).  Most adults should also do strengthening exercises at least twice a week. This is in addition to the moderate-intensity exercise.  Maintain a healthy weight  Body mass index (BMI) is a measurement that can be used to identify possible weight problems. It estimates body fat based on height and weight. Your health care provider can help determine your BMI and help you achieve or maintain a healthy weight.  For females 36 years of age and older:   A BMI below 18.5 is considered underweight.  A BMI of 18.5 to 24.9 is normal.  A BMI of 25 to 29.9 is considered overweight.  A BMI of 30 and above is considered obese.  Watch levels of cholesterol and blood lipids  You should start having your blood tested for lipids and cholesterol at 74 years of age, then have this test every 5 years.  You may need to have your cholesterol levels checked more often if:  Your lipid or cholesterol levels are high.  You are older than 74 years of age.  You are at high risk for heart disease.  CANCER SCREENING   Lung Cancer  Lung cancer screening is recommended for adults 68-43 years old who are at high risk for lung cancer because of a history of smoking.  A yearly low-dose CT scan of the lungs is recommended for people who:  Currently smoke.  Have quit within the past 15 years.  Have at least a 30-pack-year history of smoking. A pack year is smoking an average of one pack of cigarettes a day for 1 year.  Yearly screening should continue until it has been 15 years since you quit.  Yearly screening should stop if you develop a health problem that would prevent you from having lung cancer treatment.  Breast Cancer  Practice breast self-awareness. This means understanding how your breasts normally appear and feel.  It also means doing regular  breast self-exams.  Let your health care provider know about any changes, no matter how small.  If you are in your 20s or 30s, you should have a clinical breast exam (CBE) by a health care provider every 1-3 years as part of a regular health exam.  If you are 67 or older, have a CBE every year. Also consider having a breast X-ray (mammogram) every year.  If you have a family history of breast cancer, talk to your health care provider about genetic screening.  If you are at high risk for breast cancer, talk to your health care provider about having an MRI and a mammogram every year.  Breast cancer gene (BRCA) assessment is recommended for women who have family members with BRCA-related cancers. BRCA-related cancers include:  Breast.  Ovarian.  Tubal.  Peritoneal cancers.  Results of the assessment will determine the need for genetic counseling and BRCA1 and BRCA2 testing. Cervical Cancer Your health care provider may recommend that you be screened regularly for cancer of the pelvic organs (ovaries, uterus, and vagina). This screening involves a pelvic examination, including checking for microscopic changes to the surface of your cervix (Pap test). You may be encouraged to have this screening done every 3 years, beginning at age 15.  For women ages 24-65, health care providers may recommend pelvic exams and Pap testing every 3 years, or they may recommend the Pap and pelvic exam, combined with testing for human papilloma virus (HPV), every 5 years. Some types of HPV increase your risk of cervical cancer. Testing for HPV may also be done on women of any age with unclear Pap test results.  Other health care providers may not recommend any screening for nonpregnant women who are considered low risk for pelvic cancer and who do not have symptoms. Ask your health care provider if a screening pelvic exam is right for you.  If you have had past treatment for cervical cancer or a condition that  could lead to cancer, you need Pap tests and screening for cancer for at least 20 years after your treatment. If Pap tests have been discontinued, your risk factors (such as having a new sexual partner) need to be reassessed to determine if screening should resume. Some women have medical problems that increase the chance of getting cervical cancer. In these cases, your health care provider may recommend more frequent screening and Pap tests. Colorectal Cancer  This type of cancer can be detected and often prevented.  Routine colorectal cancer screening usually begins at 74 years of age and continues through 74 years of age.  Your health care provider may recommend screening at an earlier age if you have risk factors for colon cancer.  Your health care provider may also recommend using home test kits to check for hidden blood in the stool.  A small camera at the end of a tube can be used to examine your colon directly (sigmoidoscopy or colonoscopy). This is done to check for the earliest forms of colorectal cancer.  Routine screening usually begins at age 75.  Direct examination of the colon should be repeated every 5-10 years through 74 years of age. However, you may need to be screened more often if early forms of precancerous polyps or small growths are found. Skin Cancer  Check your skin from head to toe regularly.  Tell your health care provider about any new moles or changes in moles, especially if there is a change in a mole's shape or color.  Also tell your  health care provider if you have a mole that is larger than the size of a pencil eraser.  Always use sunscreen. Apply sunscreen liberally and repeatedly throughout the day.  Protect yourself by wearing long sleeves, pants, a wide-brimmed hat, and sunglasses whenever you are outside. HEART DISEASE, DIABETES, AND HIGH BLOOD PRESSURE   High blood pressure causes heart disease and increases the risk of stroke. High blood pressure  is more likely to develop in:  People who have blood pressure in the high end of the normal range (130-139/85-89 mm Hg).  People who are overweight or obese.  People who are African American.  If you are 18-39 years of age, have your blood pressure checked every 3-5 years. If you are 40 years of age or older, have your blood pressure checked every year. You should have your blood pressure measured twice--once when you are at a hospital or clinic, and once when you are not at a hospital or clinic. Record the average of the two measurements. To check your blood pressure when you are not at a hospital or clinic, you can use:  An automated blood pressure machine at a pharmacy.  A home blood pressure monitor.  If you are between 55 years and 79 years old, ask your health care provider if you should take aspirin to prevent strokes.  Have regular diabetes screenings. This involves taking a blood sample to check your fasting blood sugar level.  If you are at a normal weight and have a low risk for diabetes, have this test once every three years after 74 years of age.  If you are overweight and have a high risk for diabetes, consider being tested at a younger age or more often. PREVENTING INFECTION  Hepatitis B  If you have a higher risk for hepatitis B, you should be screened for this virus. You are considered at high risk for hepatitis B if:  You were born in a country where hepatitis B is common. Ask your health care provider which countries are considered high risk.  Your parents were born in a high-risk country, and you have not been immunized against hepatitis B (hepatitis B vaccine).  You have HIV or AIDS.  You use needles to inject street drugs.  You live with someone who has hepatitis B.  You have had sex with someone who has hepatitis B.  You get hemodialysis treatment.  You take certain medicines for conditions, including cancer, organ transplantation, and autoimmune  conditions. Hepatitis C  Blood testing is recommended for:  Everyone born from 1945 through 1965.  Anyone with known risk factors for hepatitis C. Sexually transmitted infections (STIs)  You should be screened for sexually transmitted infections (STIs) including gonorrhea and chlamydia if:  You are sexually active and are younger than 74 years of age.  You are older than 74 years of age and your health care provider tells you that you are at risk for this type of infection.  Your sexual activity has changed since you were last screened and you are at an increased risk for chlamydia or gonorrhea. Ask your health care provider if you are at risk.  If you do not have HIV, but are at risk, it may be recommended that you take a prescription medicine daily to prevent HIV infection. This is called pre-exposure prophylaxis (PrEP). You are considered at risk if:  You are sexually active and do not regularly use condoms or know the HIV status of your partner(s).  You   take drugs by injection.  You are sexually active with a partner who has HIV. Talk with your health care provider about whether you are at high risk of being infected with HIV. If you choose to begin PrEP, you should first be tested for HIV. You should then be tested every 3 months for as long as you are taking PrEP.  PREGNANCY   If you are premenopausal and you may become pregnant, ask your health care provider about preconception counseling.  If you may become pregnant, take 400 to 800 micrograms (mcg) of folic acid every day.  If you want to prevent pregnancy, talk to your health care provider about birth control (contraception). OSTEOPOROSIS AND MENOPAUSE   Osteoporosis is a disease in which the bones lose minerals and strength with aging. This can result in serious bone fractures. Your risk for osteoporosis can be identified using a bone density scan.  If you are 33 years of age or older, or if you are at risk for  osteoporosis and fractures, ask your health care provider if you should be screened.  Ask your health care provider whether you should take a calcium or vitamin D supplement to lower your risk for osteoporosis.  Menopause may have certain physical symptoms and risks.  Hormone replacement therapy may reduce some of these symptoms and risks. Talk to your health care provider about whether hormone replacement therapy is right for you.  HOME CARE INSTRUCTIONS   Schedule regular health, dental, and eye exams.  Stay current with your immunizations.   Do not use any tobacco products including cigarettes, chewing tobacco, or electronic cigarettes.  If you are pregnant, do not drink alcohol.  If you are breastfeeding, limit how much and how often you drink alcohol.  Limit alcohol intake to no more than 1 drink per day for nonpregnant women. One drink equals 12 ounces of beer, 5 ounces of wine, or 1 ounces of hard liquor.  Do not use street drugs.  Do not share needles.  Ask your health care provider for help if you need support or information about quitting drugs.  Tell your health care provider if you often feel depressed.  Tell your health care provider if you have ever been abused or do not feel safe at home.   This information is not intended to replace advice given to you by your health care provider. Make sure you discuss any questions you have with your health care provider.   Document Released: 02/08/2011 Document Revised: 08/16/2014 Document Reviewed: 06/27/2013 Elsevier Interactive Patient Education [employer].

## 2016-04-08 NOTE — Patient Instructions (Addendum)
  It was a pleasure meeting you today!  Recommend using Cetaphil soap for sensitive skin- liquid soap in bottle with pump.   Try using Aveeno lotion for dry skin. Eucerin cream is also good to use.   Follow up in 3-4 months

## 2016-04-20 ENCOUNTER — Ambulatory Visit (AMBULATORY_SURGERY_CENTER): Payer: Self-pay | Admitting: *Deleted

## 2016-04-20 ENCOUNTER — Encounter (INDEPENDENT_AMBULATORY_CARE_PROVIDER_SITE_OTHER): Payer: Self-pay

## 2016-04-20 VITALS — Ht 61.5 in | Wt 152.6 lb

## 2016-04-20 DIAGNOSIS — Z86010 Personal history of colonic polyps: Secondary | ICD-10-CM

## 2016-04-20 MED ORDER — NA SULFATE-K SULFATE-MG SULF 17.5-3.13-1.6 GM/177ML PO SOLN: 1.0000 | Freq: Once | ORAL | 0 refills | Status: AC

## 2016-04-20 NOTE — Progress Notes (Signed)
Denies allergies to eggs or soy products. Denies complications with sedation or anesthesia. Denies O2 use. Denies use of diet or weight loss medications.  Emmi instructions not given for colonoscopy. No access to the Internet.

## 2016-04-23 ENCOUNTER — Encounter: Payer: Self-pay | Admitting: Gastroenterology

## 2016-05-04 ENCOUNTER — Ambulatory Visit (AMBULATORY_SURGERY_CENTER): Payer: PPO | Admitting: Gastroenterology

## 2016-05-04 ENCOUNTER — Encounter: Payer: Self-pay | Admitting: Gastroenterology

## 2016-05-04 VITALS — BP 131/59 | HR 52 | Temp 97.3°F | Resp 15 | Ht 61.5 in | Wt 152.0 lb

## 2016-05-04 DIAGNOSIS — K5730 Diverticulosis of large intestine without perforation or abscess without bleeding: Secondary | ICD-10-CM | POA: Diagnosis not present

## 2016-05-04 DIAGNOSIS — K635 Polyp of colon: Secondary | ICD-10-CM | POA: Diagnosis not present

## 2016-05-04 DIAGNOSIS — D126 Benign neoplasm of colon, unspecified: Secondary | ICD-10-CM | POA: Diagnosis not present

## 2016-05-04 DIAGNOSIS — I10 Essential (primary) hypertension: Secondary | ICD-10-CM | POA: Diagnosis not present

## 2016-05-04 DIAGNOSIS — D122 Benign neoplasm of ascending colon: Secondary | ICD-10-CM | POA: Diagnosis not present

## 2016-05-04 DIAGNOSIS — Z86010 Personal history of colonic polyps: Secondary | ICD-10-CM

## 2016-05-04 DIAGNOSIS — D125 Benign neoplasm of sigmoid colon: Secondary | ICD-10-CM

## 2016-05-04 DIAGNOSIS — N289 Disorder of kidney and ureter, unspecified: Secondary | ICD-10-CM | POA: Diagnosis not present

## 2016-05-04 DIAGNOSIS — N19 Unspecified kidney failure: Secondary | ICD-10-CM | POA: Diagnosis not present

## 2016-05-04 MED ORDER — SODIUM CHLORIDE 0.9 % IV SOLN: 500.0000 mL | INTRAVENOUS | Status: DC

## 2016-05-04 NOTE — Addendum Note (Signed)
Addended by: Cannon Kettle on: 05/04/2016 12:22 PM   Modules accepted: Orders

## 2016-05-04 NOTE — Progress Notes (Signed)
Called to room to assist during endoscopic procedure.  Patient ID and intended procedure confirmed with present staff. Received instructions for my participation in the procedure from the performing physician.  

## 2016-05-04 NOTE — Progress Notes (Signed)
To recovery, report to [place], RN, VSS 

## 2016-05-04 NOTE — Op Note (Signed)
[place] Patient Name: Meredith Owens Procedure Date: 05/04/2016 9:47 AM MRN: [id number] Endoscopist: Milus Banister , MD Age: 75 Referring MD:  Date of Birth: [birth year] Gender: Female Account #: [id number] Procedure:                Colonoscopy Indications:              High risk colon cancer surveillance: Personal                            history of colonic polyps (five subCM adenomas                            removed Dr. Ardis Hughs colonoscopy 2014) Medicines:                Monitored Anesthesia Care Procedure:                Pre-Anesthesia Assessment:                           - Prior to the procedure, a History and Physical                            was performed, and patient medications and                            allergies were reviewed. The patient's tolerance of                            previous anesthesia was also reviewed. The risks                            and benefits of the procedure and the sedation                            options and risks were discussed with the patient.                            All questions were answered, and informed consent                            was obtained. Prior Anticoagulants: The patient has                            taken no previous anticoagulant or antiplatelet                            agents. ASA Grade Assessment: II - A patient with                            mild systemic disease. After reviewing the risks                            and benefits, the patient was deemed in  satisfactory condition to undergo the procedure.                           After obtaining informed consent, the colonoscope                            was passed under direct vision. Throughout the                            procedure, the patient's blood pressure, pulse, and                            oxygen saturations were monitored continuously. The                            Model CF-HQ190L [id number] [id number])  scope was introduced                            through the anus and advanced to the the cecum,                            identified by appendiceal orifice and ileocecal                            valve. The colonoscopy was performed without                            difficulty. The patient tolerated the procedure                            well. The quality of the bowel preparation was                            good. The ileocecal valve, appendiceal orifice, and                            rectum were photographed. Scope In: 10:00:35 AM Scope Out: 10:12:49 AM Scope Withdrawal Time: 0 hours 8 minutes 52 seconds  Total Procedure Duration: 0 hours 12 minutes 14 seconds  Findings:                 Two sessile polyps were found in the sigmoid colon                            and ascending colon. The polyps were 4 to 7 mm in                            size. These polyps were removed with a cold snare.                            Resection and retrieval were complete.                           Multiple small and large-mouthed diverticula were  found in the left colon.                           The exam was otherwise without abnormality on                            direct and retroflexion views. Complications:            No immediate complications. Estimated blood loss:                            None. Estimated Blood Loss:     Estimated blood loss: none. Impression:               - Two 4 to 7 mm polyps in the sigmoid colon and in                            the ascending colon, removed with a cold snare.                            Resected and retrieved.                           - Diverticulosis in the left colon.                           - The examination was otherwise normal on direct                            and retroflexion views. Recommendation:           - Patient has a contact number available for                            emergencies. The signs and  symptoms of potential                            delayed complications were discussed with the                            patient. Return to normal activities tomorrow.                            Written discharge instructions were provided to the                            patient.                           - Resume previous diet.                           - Continue present medications.                           You will receive a letter within 2-3 weeks with the  pathology results and my final recommendations.                           If the polyp(s) is proven to be 'pre-cancerous' on                            pathology, you will need repeat colonoscopy in 5                            years. Milus Banister, MD 05/04/2016 10:16:00 AM This report has been signed electronically.

## 2016-05-04 NOTE — Patient Instructions (Signed)
YOU HAD AN ENDOSCOPIC PROCEDURE TODAY AT [facility]:   Refer to the procedure report that was given to you for any specific questions about what was found during the examination.  If the procedure report does not answer your questions, please call your gastroenterologist to clarify.  If you requested that your care partner not be given the details of your procedure findings, then the procedure report has been included in a sealed envelope for you to review at your convenience later.  YOU SHOULD EXPECT: Some feelings of bloating in the abdomen. Passage of more gas than usual.  Walking can help get rid of the air that was put into your GI tract during the procedure and reduce the bloating. If you had a lower endoscopy (such as a colonoscopy or flexible sigmoidoscopy) you may notice spotting of blood in your stool or on the toilet paper. If you underwent a bowel prep for your procedure, you may not have a normal bowel movement for a few days.  Please Note:  You might notice some irritation and congestion in your nose or some drainage.  This is from the oxygen used during your procedure.  There is no need for concern and it should clear up in a day or so.  SYMPTOMS TO REPORT IMMEDIATELY:   Following lower endoscopy (colonoscopy or flexible sigmoidoscopy):  Excessive amounts of blood in the stool  Significant tenderness or worsening of abdominal pains  Swelling of the abdomen that is new, acute  Fever of 100F or higher  For urgent or emergent issues, a gastroenterologist can be reached at any hour by calling [phone].   DIET:  We do recommend a small meal at first, but then you may proceed to your regular diet.  Drink plenty of fluids but you should avoid alcoholic beverages for 24 hours.  ACTIVITY:  You should plan to take it easy for the rest of today and you should NOT DRIVE or use heavy machinery until tomorrow (because of the sedation medicines used during the test).     FOLLOW UP: Our staff will call the number listed on your records the next business day following your procedure to check on you and address any questions or concerns that you may have regarding the information given to you following your procedure. If we do not reach you, we will leave a message.  However, if you are feeling well and you are not experiencing any problems, there is no need to return our call.  We will assume that you have returned to your regular daily activities without incident.  If any biopsies were taken you will be contacted by phone or by letter within the next 1-3 weeks.  Please call us at [phone] if you have not heard about the biopsies in 3 weeks.    SIGNATURES/CONFIDENTIALITY: You and/or your care partner have signed paperwork which will be entered into your electronic medical record.  These signatures attest to the fact that that the information above on your After Visit Summary has been reviewed and is understood.  Full responsibility of the confidentiality of this discharge information lies with you and/or your care-partner.  Please read over handouts about polyps, diverticulosis and high fiber diets  Please continue your normal medications  Await pathology

## 2016-05-05 ENCOUNTER — Telehealth: Payer: Self-pay | Admitting: *Deleted

## 2016-05-05 NOTE — Telephone Encounter (Signed)
  Follow up Call-  Call back number 05/04/2016  Post procedure Call Back phone  # [phone]  Permission to leave phone message Yes  Some recent data might be hidden     Patient questions:  Do you have a fever, pain , or abdominal swelling? No. Pain Score  0 *  Have you tolerated food without any problems? Yes.    Have you been able to return to your normal activities? Yes.    Do you have any questions about your discharge instructions: Diet   No. Medications  No. Follow up visit  No.  Do you have questions or concerns about your Care? No.  Actions: * If pain score is 4 or above: No action needed, pain <4.

## 2016-05-10 ENCOUNTER — Encounter: Payer: Self-pay | Admitting: Gastroenterology

## 2016-05-13 ENCOUNTER — Other Ambulatory Visit: Payer: Self-pay | Admitting: Family Medicine

## 2016-07-05 ENCOUNTER — Other Ambulatory Visit: Payer: Self-pay | Admitting: Family Medicine

## 2016-07-11 ENCOUNTER — Other Ambulatory Visit: Payer: Self-pay | Admitting: Family Medicine

## 2016-07-12 ENCOUNTER — Encounter: Payer: Self-pay | Admitting: *Deleted

## 2016-07-14 DIAGNOSIS — H401221 Low-tension glaucoma, left eye, mild stage: Secondary | ICD-10-CM | POA: Diagnosis not present

## 2016-07-14 DIAGNOSIS — Z970 Presence of artificial eye: Secondary | ICD-10-CM | POA: Diagnosis not present

## 2016-07-27 ENCOUNTER — Other Ambulatory Visit: Payer: Self-pay | Admitting: Family Medicine

## 2016-07-27 DIAGNOSIS — Z1231 Encounter for screening mammogram for malignant neoplasm of breast: Secondary | ICD-10-CM

## 2016-08-13 ENCOUNTER — Ambulatory Visit (INDEPENDENT_AMBULATORY_CARE_PROVIDER_SITE_OTHER): Payer: PPO | Admitting: Family Medicine

## 2016-08-13 ENCOUNTER — Encounter: Payer: Self-pay | Admitting: Family Medicine

## 2016-08-13 VITALS — BP 142/60 | HR 57 | Temp 98.5°F | Ht 62.0 in | Wt 153.0 lb

## 2016-08-13 DIAGNOSIS — L853 Xerosis cutis: Secondary | ICD-10-CM | POA: Diagnosis not present

## 2016-08-13 DIAGNOSIS — I10 Essential (primary) hypertension: Secondary | ICD-10-CM | POA: Diagnosis not present

## 2016-08-13 NOTE — Patient Instructions (Signed)
  It's was good to see you again!  You're doing a great job taking all your medications and keeping track of your blood pressure at home. Keep up the good work!  I'd like to see you back in June and we can check labs at that time. Please do not hesitate to call or come back in if you have any needs or concerns.

## 2016-08-13 NOTE — Progress Notes (Signed)
    Subjective:    Patient ID: Christin Fudge, female    DOB: February 20, 1941, 76 y.o.   MRN: [id number]   CC: here to follow up on BP  Has been doing well. Checks BP occasionally when out at pharmacy/stores. Feels well overall. Has some stress currently about her apartment pipes freezing with the cold weather. Several neighbors have had pipes burst in their ceilings. Denies chest pain, shortness of breath, swelling in her legs. Follows closely with ohphthalmologist. Has mammogram scheduled for end of January. Still having dry skin but is using vaseline and oil to help. No rashes or lesions. She is concerned about a bump on the inside of her right ear. It is not painful but she is concerned over what it is.  Smoking status reviewed- non-smoker  Review of Systems- see HPI   Objective:  BP (!) 142/60   Pulse (!) 57   Temp 98.5 F (36.9 C) (Oral)   Ht 5\' 2"  (1.575 m)   Wt 153 lb (69.4 kg)   SpO2 99%   BMI 27.98 kg/m  Vitals and nursing note reviewed  General: pleasant elderly lady, well nourished, in no acute distress HEENT: normocephalic, TM's visualized bilaterally. Small amount of wax in ear canals bilaterally, not occluded. Small pinpoint sized white bump on inside of right auricle. No scleral icterus or conjunctival pallor, no nasal discharge, moist mucous membranes, good dentition without erythema or discharge noted in posterior oropharynx  Cardiac: RRR, clear S1 and S2, no murmurs, rubs, or gallops Respiratory: clear to auscultation bilaterally, no increased work of breathing Abdomen: soft, nontender, nondistended Extremities: no edema or cyanosis. Skin: warm and dry, no rashes noted Neuro: alert and oriented, no focal deficits   Assessment & Plan:    Essential hypertension, benign  Doing well, at goal today. Continue current medications She does not need refill at this time Continue checking BP/logging this Follow up in June, will do bloodwork at that time    Return in  about 5 months (around 01/11/2017).   [name], DO Family Medicine Resident PGY-1

## 2016-08-13 NOTE — Assessment & Plan Note (Signed)
  Doing well, at goal today. Continue current medications She does not need refill at this time Continue checking BP/logging this Follow up in June, will do bloodwork at that time

## 2016-09-07 ENCOUNTER — Ambulatory Visit
Admission: RE | Admit: 2016-09-07 | Discharge: 2016-09-07 | Disposition: A | Discharge status: Home / Self Care | Payer: PPO | Source: Ambulatory Visit | Attending: Family Medicine | Admitting: Family Medicine

## 2016-09-07 DIAGNOSIS — Z1231 Encounter for screening mammogram for malignant neoplasm of breast: Secondary | ICD-10-CM

## 2016-11-16 DIAGNOSIS — Z970 Presence of artificial eye: Secondary | ICD-10-CM | POA: Diagnosis not present

## 2016-11-16 DIAGNOSIS — H401221 Low-tension glaucoma, left eye, mild stage: Secondary | ICD-10-CM | POA: Diagnosis not present

## 2016-12-07 ENCOUNTER — Ambulatory Visit (INDEPENDENT_AMBULATORY_CARE_PROVIDER_SITE_OTHER): Payer: PPO | Admitting: Family Medicine

## 2016-12-07 VITALS — BP 138/60 | HR 69 | Temp 97.5°F | Wt 150.8 lb

## 2016-12-07 DIAGNOSIS — I10 Essential (primary) hypertension: Secondary | ICD-10-CM

## 2016-12-07 DIAGNOSIS — E7800 Pure hypercholesterolemia, unspecified: Secondary | ICD-10-CM | POA: Diagnosis not present

## 2016-12-07 DIAGNOSIS — M75101 Unspecified rotator cuff tear or rupture of right shoulder, not specified as traumatic: Secondary | ICD-10-CM

## 2016-12-07 DIAGNOSIS — R6889 Other general symptoms and signs: Secondary | ICD-10-CM | POA: Diagnosis not present

## 2016-12-07 MED ORDER — ROSUVASTATIN CALCIUM 10 MG PO TABS: 10.0000 mg | ORAL_TABLET | Freq: Every day | ORAL | 3 refills | Status: DC

## 2016-12-07 NOTE — Assessment & Plan Note (Addendum)
  Chronic, bothersome to patient. Has always "run cold" but feels it is extreme lately  -check TSH today

## 2016-12-07 NOTE — Progress Notes (Signed)
    Subjective:    Patient ID: Meredith Owens, female    DOB: 1940/10/09, 76 y.o.   MRN: [id number]   CC: BP follow up   HPI:  Very pleased with BP today.   Leg cramps- worse at night. Has taken cholesterol medication earlier in the day which helps sometimes.   Very cold- has kept heat on.   Smoking status reviewed  Review of Systems   Objective:  BP 138/60   Pulse 69   Temp 97.5 F (36.4 C) (Oral)   Wt 150 lb 12.8 oz (68.4 kg)   SpO2 99%   BMI 27.58 kg/m  Vitals and nursing note reviewed  General: well nourished, in no acute distress HEENT: normocephalic, TM's visualized bilaterally, no scleral icterus or conjunctival pallor, no nasal discharge, moist mucous membranes, good dentition without erythema or discharge noted in posterior oropharynx Neck: supple, non-tender, without lymphadenopathy Cardiac: RRR, clear S1 and S2, no murmurs, rubs, or gallops Respiratory: clear to auscultation bilaterally, no increased work of breathing Abdomen: soft, nontender, nondistended, no masses or organomegaly. Bowel sounds present Extremities: no edema or cyanosis. Warm, well perfused. 2+ radial and PT pulses bilaterally Skin: warm and dry, no rashes noted Neuro: alert and oriented, no focal deficits   Assessment & Plan:    No problem-specific Assessment & Plan notes found for this encounter.    No Follow-up on file.   [name], DO Family Medicine Resident PGY-1

## 2016-12-07 NOTE — Assessment & Plan Note (Addendum)
  Stable, chronic. BP today 138/60, slightly over goal of 130/80  -continue coreg -CMP, BMP today

## 2016-12-07 NOTE — Assessment & Plan Note (Addendum)
  Chronic, stable. May be having myalgias with Zocor  -switch to crestor 10 mg daily -check lipid panel today -follow up 3 months or sooner if needed

## 2016-12-07 NOTE — Progress Notes (Signed)
    Subjective:    Patient ID: Meredith Owens, female    DOB: [birth year], 76 y.o.   MRN: [id number]  CC: follow up on BP  BP- doing well on coreg, was very happy with her BP today. Denies CP, SOB, palpitations.   Shoulder pain- has had trouble with her right shoulder from working, used to be housekeeper for [employer]. Had injections in past and was told she has frozen shoulder. She has done well with it but lately she has a little more pain in her shoulder. Has a hard time lifting her arm above her head due to pain. No numbness, tingling, no weakness. She has been doing some stretches which helps.   Cold intolerance- has always run cold her whole life, she brings extra blanket and scarves with her when she goes out especially to church or to eat. However the past several months she has felt like the cold is worse than usual and wants to make sure nothing else is wrong. Denies hx of thyroid trouble, no FH hx of thyroid disease.  Leg pains- worse at night, felt like this was due to her cholesterol medication so she started taking it earlier in the day and has noticed some improvement. She describes the pain as a cramping in her calves at night when she lays down for bed. It is worse after a long day on her feet. Denies numbness/tingling anywhere,    Smoking status reviewed-non smoker  Review of Systems- see HPI   Objective:  BP 138/60   Pulse 69   Temp 97.5 F (36.4 C) (Oral)   Wt 150 lb 12.8 oz (68.4 kg)   SpO2 99%   BMI 27.58 kg/m  Vitals and nursing note reviewed  General: pleasant elderly lady, well nourished, in no acute distress HEENT: normocephalic, moist mucous membranes, good dentition without erythema or discharge noted in posterior oropharynx Neck: supple, non-tender, no thyromegaly or lymphadenopathy Cardiac: RRR, clear S1 and S2, no murmurs, rubs, or gallops Respiratory: clear to auscultation bilaterally, no increased work of breathing Extremities: no edema or  cyanosis Skin: warm and dry, no rashes noted Neuro: alert and oriented, no focal deficits, strength 5/5 in upper and lower extremities bilaterally   Assessment & Plan:    Essential hypertension, benign  Stable, chronic. BP today 138/60, slightly over goal of 130/80  -continue coreg -CMP, BMP today  Rotator cuff syndrome of right shoulder  Chronic problem. Has been bothering her lately. Has had shoulder injection in past  -shoulder exercises given, demonstrated -can consider repeat injection in future, patient did not want one today  HYPERCHOLESTEROLEMIA  Chronic, stable. May be having myalgias with Zocor  -switch to crestor 10 mg daily -check lipid panel today -follow up 3 months or sooner if needed  Cold intolerance  Chronic, bothersome to patient. Has always "run cold" but feels it is extreme lately  -check TSH today  Return in about 3 months (around 03/09/2017).   [name], DO Family Medicine Resident PGY-1

## 2016-12-07 NOTE — Patient Instructions (Addendum)

## 2016-12-07 NOTE — Assessment & Plan Note (Addendum)
  Chronic problem. Has been bothering her lately. Has had shoulder injection in past  -shoulder exercises given, demonstrated -can consider repeat injection in future, patient did not want one today

## 2016-12-08 LAB — CBC
Hematocrit: 35 % (ref 34.0–46.6)
Hemoglobin: 11.2 g/dL (ref 11.1–15.9)
MCH: 30.9 pg (ref 26.6–33.0)
MCHC: 32 g/dL (ref 31.5–35.7)
MCV: 97 fL (ref 79–97)
PLATELETS: 252 10*3/uL (ref 150–379)
RBC: 3.62 x10E6/uL — AB (ref 3.77–5.28)
RDW: 14.7 % (ref 12.3–15.4)
WBC: 6.7 10*3/uL (ref 3.4–10.8)

## 2016-12-08 LAB — LIPID PANEL
CHOLESTEROL TOTAL: 167 mg/dL (ref 100–199)
Chol/HDL Ratio: 2.6 ratio (ref 0.0–4.4)
HDL: 65 mg/dL (ref >39)
LDL Calculated: 87 mg/dL (ref 0–99)
Triglycerides: 74 mg/dL (ref 0–149)
VLDL Cholesterol Cal: 15 mg/dL (ref 5–40)

## 2016-12-08 LAB — CMP14+EGFR
ALK PHOS: 55 IU/L (ref 39–117)
ALT: 16 IU/L (ref 0–32)
AST: 20 IU/L (ref 0–40)
Albumin/Globulin Ratio: 1.6 (ref 1.2–2.2)
Albumin: 4.4 g/dL (ref 3.5–4.8)
BUN/Creatinine Ratio: 14 (ref 12–28)
BUN: 15 mg/dL (ref 8–27)
Bilirubin Total: 0.3 mg/dL (ref 0.0–1.2)
CHLORIDE: 99 mmol/L (ref 96–106)
CO2: 28 mmol/L (ref 18–29)
CREATININE: 1.09 mg/dL — AB (ref 0.57–1.00)
Calcium: 9.7 mg/dL (ref 8.7–10.3)
GFR calc Af Amer: 57 mL/min/{1.73_m2} — ABNORMAL LOW (ref >59)
GFR calc non Af Amer: 50 mL/min/{1.73_m2} — ABNORMAL LOW (ref >59)
GLOBULIN, TOTAL: 2.7 g/dL (ref 1.5–4.5)
Glucose: 94 mg/dL (ref 65–99)
POTASSIUM: 4.2 mmol/L (ref 3.5–5.2)
SODIUM: 139 mmol/L (ref 134–144)
Total Protein: 7.1 g/dL (ref 6.0–8.5)

## 2016-12-08 LAB — TSH: TSH: 1.39 u[IU]/mL (ref 0.450–4.500)

## 2016-12-13 ENCOUNTER — Telehealth: Payer: Self-pay | Admitting: Family Medicine

## 2016-12-13 ENCOUNTER — Encounter: Payer: Self-pay | Admitting: *Deleted

## 2016-12-13 NOTE — Telephone Encounter (Signed)
Labs and note written and mailed. Philander Ake,CMA

## 2016-12-13 NOTE — Telephone Encounter (Signed)
Pt would like a copy of her recent lab results and a note stating results are normal, mailed to her. Address on file is correct. ep

## 2016-12-14 ENCOUNTER — Telehealth: Payer: Self-pay | Admitting: *Deleted

## 2016-12-14 NOTE — Telephone Encounter (Signed)
Patient called to update provider that she was going to start taking women's one a day multivitamin over 50.  Informed patient that there should be no problems with this, but would let her know if provider decided something different. [name],CMA

## 2016-12-14 NOTE — Telephone Encounter (Signed)
Thanks for informing me. That sounds great.

## 2017-02-15 ENCOUNTER — Encounter: Payer: Self-pay | Admitting: Family Medicine

## 2017-02-15 ENCOUNTER — Ambulatory Visit (INDEPENDENT_AMBULATORY_CARE_PROVIDER_SITE_OTHER): Payer: PPO | Admitting: Family Medicine

## 2017-02-15 VITALS — BP 130/68 | HR 80 | Temp 98.5°F | Ht 62.0 in | Wt 149.8 lb

## 2017-02-15 DIAGNOSIS — Z0000 Encounter for general adult medical examination without abnormal findings: Secondary | ICD-10-CM | POA: Diagnosis not present

## 2017-02-15 DIAGNOSIS — L853 Xerosis cutis: Secondary | ICD-10-CM

## 2017-02-15 DIAGNOSIS — E7800 Pure hypercholesterolemia, unspecified: Secondary | ICD-10-CM | POA: Diagnosis not present

## 2017-02-15 DIAGNOSIS — I10 Essential (primary) hypertension: Secondary | ICD-10-CM | POA: Diagnosis not present

## 2017-02-15 DIAGNOSIS — Z23 Encounter for immunization: Secondary | ICD-10-CM

## 2017-02-15 MED ORDER — HYDROCORTISONE 1 % EX OINT: 1.0000 "application " | TOPICAL_OINTMENT | Freq: Two times a day (BID) | CUTANEOUS | 0 refills | Status: DC

## 2017-02-15 MED ORDER — TETANUS-DIPHTH-ACELL PERTUSSIS 5-2.5-18.5 LF-MCG/0.5 IM SUSP: 0.5000 mL | Freq: Once | INTRAMUSCULAR | 0 refills | Status: AC

## 2017-02-15 NOTE — Assessment & Plan Note (Signed)
  Patient due for tetanus booster, pneumonia vaccine 2nd shot, and Dexa  -pna vax given in office today -rx given for tetanus booster -patient wants to hold off on dexa scan at this time, will discuss at next visit

## 2017-02-15 NOTE — Assessment & Plan Note (Signed)
  Doing well, tolerating Crestor well  -continue crestor -recheck lipid panel 1 year

## 2017-02-15 NOTE — Progress Notes (Signed)
    Subjective:    Patient ID: Christin Fudge, female    DOB: [birth year], 76 y.o.   MRN: [id number]   CC: follow up BP and HLD  HPI:  Doing well, main complaint today is her sensitive skin. She has a lot of issues with small itchy bumps that randomly come and go. Uses sensitive body soaps and lotions but still has occasional issues. This has been lifelong. Still has small itchy bump inside her ear. She clears her ears with just a washcloth during shower. Does not use q-tips. Bump does not hurt but it bothers her occasionally.  Was worried about diastolic blood pressure being in 60's thought this was too high. Also unsure what her pulse rate is supposed to be. Tolerating medicines well. No chest pain, SOB, LE edema  Happy with cholesterol results from last test. Tolerating crestor, no muscle pains reported.   Smoking status reviewed- non smoker  Review of Systems- see HPI   Objective:  BP 130/68   Pulse 80   Temp 98.5 F (36.9 C) (Oral)   Ht 5\' 2"  (1.575 m)   Wt 149 lb 12.8 oz (67.9 kg)   SpO2 99%   BMI 27.40 kg/m  Vitals and nursing note reviewed  General: pleasant elderly lady, well nourished, in no acute distress Ears: small raised bump inside left pinna, no erythema or discharge Cardiac: RRR, clear S1 and S2, no murmurs, rubs, or gallops Respiratory: clear to auscultation bilaterally, no increased work of breathing Abdomen: soft, NTND, +BS Extremities: no edema or cyanosis Skin: dry, eczematous changes noted over flexor surfaces. Scattered papules noted over chest and arms that are pruritic, no erythema or drainage noted Neuro: alert and oriented, no focal deficits  Assessment & Plan:    Essential hypertension, benign  At goal today <130/90 in clinic and all home readings at goal as well.   -reassured patient that diastolic pressures are good if they are less than 90, pulse rate between 50-100 is acceptable -continue Coreg, Hyzaar and daily aspirin -continue  monitoring BP at home -follow up 6 months  HYPERCHOLESTEROLEMIA  Doing well, tolerating Crestor well  -continue crestor -recheck lipid panel 1 year  Preventative health care  Patient due for tetanus booster, pneumonia vaccine 2nd shot, and Dexa  -pna vax given in office today -rx given for tetanus booster -patient wants to hold off on dexa scan at this time, will discuss at next visit  Dry skin  Chronic problem for patient.  -continue unscented soaps and lotions at home -rx given for hydrocortisone ointment to use on itchy spots and outside of ear bump BID -follow up as needed     Return in about 6 months (around 08/18/2017), or if symptoms worsen or fail to improve.   [name], DO Family Medicine Resident PGY-2

## 2017-02-15 NOTE — Assessment & Plan Note (Signed)
  Chronic problem for patient.  -continue unscented soaps and lotions at home -rx given for hydrocortisone ointment to use on itchy spots and outside of ear bump BID -follow up as needed

## 2017-02-15 NOTE — Assessment & Plan Note (Addendum)
  At goal today <130/90 in clinic and all home readings at goal as well.   -reassured patient that diastolic pressures are good if they are less than 90, pulse rate between 50-100 is acceptable -continue Coreg, Hyzaar and daily aspirin -continue monitoring BP at home -follow up 6 months

## 2017-02-15 NOTE — Patient Instructions (Addendum)
   It was great seeing you today!  You can use the hydrocortisone cream on the outside of your ear for the itchy spot.  We'll see you back in 6 months.   If you have questions or concerns please do not hesitate to call at [phone].  [name], DO PGY-2, [facility] 02/15/2017 2:52 PM

## 2017-02-18 ENCOUNTER — Telehealth: Payer: Self-pay | Admitting: Family Medicine

## 2017-02-18 NOTE — Telephone Encounter (Signed)
Explained to patient that she can get this vaccine at her convenience and that her script is good for a year.  Patient voiced understanding. Meredith Owens,CMA

## 2017-02-18 NOTE — Telephone Encounter (Signed)
Pt did get pneumonia shot.  She was given Rx for tetanus shot.  Pt wants to know if she was suppose to get the shot the same day the Rx was written or can it wait?

## 2017-02-21 DIAGNOSIS — H16222 Keratoconjunctivitis sicca, not specified as Sjogren's, left eye: Secondary | ICD-10-CM | POA: Diagnosis not present

## 2017-02-21 DIAGNOSIS — H401221 Low-tension glaucoma, left eye, mild stage: Secondary | ICD-10-CM | POA: Diagnosis not present

## 2017-02-21 DIAGNOSIS — Z961 Presence of intraocular lens: Secondary | ICD-10-CM | POA: Diagnosis not present

## 2017-02-21 DIAGNOSIS — Z970 Presence of artificial eye: Secondary | ICD-10-CM | POA: Diagnosis not present

## 2017-04-04 ENCOUNTER — Other Ambulatory Visit: Payer: Self-pay | Admitting: Family Medicine

## 2017-05-24 DIAGNOSIS — H16222 Keratoconjunctivitis sicca, not specified as Sjogren's, left eye: Secondary | ICD-10-CM | POA: Diagnosis not present

## 2017-05-24 DIAGNOSIS — H401221 Low-tension glaucoma, left eye, mild stage: Secondary | ICD-10-CM | POA: Diagnosis not present

## 2017-05-24 DIAGNOSIS — Z970 Presence of artificial eye: Secondary | ICD-10-CM | POA: Diagnosis not present

## 2017-05-24 DIAGNOSIS — Z961 Presence of intraocular lens: Secondary | ICD-10-CM | POA: Diagnosis not present

## 2017-05-27 ENCOUNTER — Encounter: Payer: Self-pay | Admitting: Family Medicine

## 2017-05-27 ENCOUNTER — Ambulatory Visit (INDEPENDENT_AMBULATORY_CARE_PROVIDER_SITE_OTHER): Payer: PPO | Admitting: Family Medicine

## 2017-05-27 VITALS — BP 139/60 | Temp 97.7°F | Ht 62.0 in | Wt 150.6 lb

## 2017-05-27 DIAGNOSIS — E2839 Other primary ovarian failure: Secondary | ICD-10-CM | POA: Diagnosis not present

## 2017-05-27 DIAGNOSIS — Z23 Encounter for immunization: Secondary | ICD-10-CM | POA: Diagnosis not present

## 2017-05-27 DIAGNOSIS — Z0000 Encounter for general adult medical examination without abnormal findings: Secondary | ICD-10-CM | POA: Diagnosis not present

## 2017-05-27 DIAGNOSIS — I10 Essential (primary) hypertension: Secondary | ICD-10-CM

## 2017-05-27 MED ORDER — TETANUS-DIPHTH-ACELL PERTUSSIS 5-2.5-18.5 LF-MCG/0.5 IM SUSP: 0.5000 mL | Freq: Once | INTRAMUSCULAR | 0 refills | Status: AC

## 2017-05-27 NOTE — Assessment & Plan Note (Signed)
-  flu shot given today -dexa ordered and information given to schedule this -rx given for tdap to get at local pharamacy

## 2017-05-27 NOTE — Progress Notes (Signed)
    Subjective:    Patient ID: Meredith Owens, female    DOB: [birth year], 76 y.o.   MRN: [id number]   CC: follow up BP  Overall doing well. Has had 3 deaths in her family in past month which has been hard on her. She is coping well. Close with her family. She is very active in church and this is helping her.   BP Doing well, denies chest pain, shortness of breath, headaches, blurred vision, LE edema.   Health maintenance Due for Tdap- tried to get this at her pharmacy and was told it was 45 dollars so she is waiting until she can afford it Due for Dexa- she is agreeable to get this done Flu shot- due for flu, agreed to get this done today  Smoking status reviewed- non-smoker  Review of Systems- see HPI   Objective:  BP 139/60 (BP Location: Left Arm, Patient Position: Sitting, Cuff Size: Normal)   Temp 97.7 F (36.5 C) (Oral)   Ht 5\' 2"  (1.575 m)   Wt 150 lb 9.6 oz (68.3 kg)   SpO2 98%   BMI 27.55 kg/m  Vitals and nursing note reviewed  General: well nourished, in no acute distress Cardiac: RRR, clear S1 and S2, no murmurs, rubs, or gallops Respiratory: clear to auscultation bilaterally, no increased work of breathing Extremities: no edema or cyanosis.  Skin: warm and dry, no rashes noted Neuro: alert and oriented, no focal deficits   Assessment & Plan:    No problem-specific Assessment & Plan notes found for this encounter.    Return in about 3 months (around 08/27/2017).   [name], DO Family Medicine Resident PGY-2

## 2017-05-27 NOTE — Patient Instructions (Signed)
It was great seeing you today!  Please call the breast center to get your Dexa Scan done.  I gave you a prescription to get your tetanus shot at your convenience.   I'd like to see you back in about 3 months.  If you have questions or concerns please do not hesitate to call at [phone].  [name], DO PGY-2, [facility] 05/27/2017 11:20 AM    Health Maintenance for Postmenopausal Women  What should I know about immunizations? It is important that you get and maintain your immunizations. These include:  Tetanus, diphtheria, and pertussis (Tdap) booster vaccine.  Influenza every year before the flu season begins.  Pneumonia vaccine.  Shingles vaccine.   What should I know about heart disease and stroke? Heart disease, heart attack, and stroke become more likely as you age. This may be due, in part, to the hormonal changes that your body experiences during menopause. These can affect how your body processes dietary fats, triglycerides, and cholesterol. Heart attack and stroke are both medical emergencies. There are many things that you can do to help prevent heart disease and stroke:  Have your blood pressure checked at least every 1-2 years. High blood pressure causes heart disease and increases the risk of stroke.  If you are 48-30 years old, ask your health care provider if you should take aspirin to prevent a heart attack or a stroke.  Do not use any tobacco products, including cigarettes, chewing tobacco, or electronic cigarettes. If you need help quitting, ask your health care provider.  It is important to eat a healthy diet and maintain a healthy weight. ? Be sure to include plenty of vegetables, fruits, low-fat dairy products, and lean protein. ? Avoid eating foods that are high in solid fats, added sugars, or salt (sodium).  Get regular exercise. This is one of the most important things that you can do for your health. ? Try to exercise  for at least 150 minutes each week. The type of exercise that you do should increase your heart rate and make you sweat. This is known as moderate-intensity exercise. ? Try to do strengthening exercises at least twice each week. Do these in addition to the moderate-intensity exercise.  Know your numbers.Ask your health care provider to check your cholesterol and your blood glucose. Continue to have your blood tested as directed by your health care provider.  What should I know about cancer screening? There are several types of cancer. Take the following steps to reduce your risk and to catch any cancer development as early as possible. Breast Cancer  Practice breast self-awareness. ? This means understanding how your breasts normally appear and feel. ? It also means doing regular breast self-exams. Let your health care provider know about any changes, no matter how small.  If you are 44 or older, have a clinician do a breast exam (clinical breast exam or CBE) every year. Depending on your age, family history, and medical history, it may be recommended that you also have a yearly breast X-ray (mammogram).  If you have a family history of breast cancer, talk with your health care provider about genetic screening.  If you are at high risk for breast cancer, talk with your health care provider about having an MRI and a mammogram every year.  Breast cancer (BRCA) gene test is recommended for women who have family members with BRCA-related cancers. Results of the assessment will determine the need for genetic counseling and BRCA1  and for BRCA2 testing. BRCA-related cancers include these types: ? Breast. This occurs in males or females. ? Ovarian. ? Tubal. This may also be called fallopian tube cancer. ? Cancer of the abdominal or pelvic lining (peritoneal cancer). ? Prostate. ? Pancreatic.  Cervical, Uterine, and Ovarian Cancer Your health care provider may recommend that you be screened  regularly for cancer of the pelvic organs. These include your ovaries, uterus, and vagina. This screening involves a pelvic exam, which includes checking for microscopic changes to the surface of your cervix (Pap test).  For women ages 21-65, health care providers may recommend a pelvic exam and a Pap test every three years. For women ages 4-65, they may recommend the Pap test and pelvic exam, combined with testing for human papilloma virus (HPV), every five years. Some types of HPV increase your risk of cervical cancer. Testing for HPV may also be done on women of any age who have unclear Pap test results.  Other health care providers may not recommend any screening for nonpregnant women who are considered low risk for pelvic cancer and have no symptoms. Ask your health care provider if a screening pelvic exam is right for you.  If you have had past treatment for cervical cancer or a condition that could lead to cancer, you need Pap tests and screening for cancer for at least 20 years after your treatment. If Pap tests have been discontinued for you, your risk factors (such as having a new sexual partner) need to be reassessed to determine if you should start having screenings again. Some women have medical problems that increase the chance of getting cervical cancer. In these cases, your health care provider may recommend that you have screening and Pap tests more often.  If you have a family history of uterine cancer or ovarian cancer, talk with your health care provider about genetic screening.  If you have vaginal bleeding after reaching menopause, tell your health care provider.  There are currently no reliable tests available to screen for ovarian cancer.  Lung Cancer Lung cancer screening is recommended for adults 33-6 years old who are at high risk for lung cancer because of a history of smoking. A yearly low-dose CT scan of the lungs is recommended if you:  Currently smoke.  Have a  history of at least 30 pack-years of smoking and you currently smoke or have quit within the past 15 years. A pack-year is smoking an average of one pack of cigarettes per day for one year.  Yearly screening should:  Continue until it has been 15 years since you quit.  Stop if you develop a health problem that would prevent you from having lung cancer treatment.  Colorectal Cancer  This type of cancer can be detected and can often be prevented.  Routine colorectal cancer screening usually begins at age 62 and continues through age 45.  If you have risk factors for colon cancer, your health care provider may recommend that you be screened at an earlier age.  If you have a family history of colorectal cancer, talk with your health care provider about genetic screening.  Your health care provider may also recommend using home test kits to check for hidden blood in your stool.  A small camera at the end of a tube can be used to examine your colon directly (sigmoidoscopy or colonoscopy). This is done to check for the earliest forms of colorectal cancer.  Direct examination of the colon should be repeated every  5-10 years until age 78. However, if early forms of precancerous polyps or small growths are found or if you have a family history or genetic risk for colorectal cancer, you may need to be screened more often.  Skin Cancer  Check your skin from head to toe regularly.  Monitor any moles. Be sure to tell your health care provider: ? About any new moles or changes in moles, especially if there is a change in a mole's shape or color. ? If you have a mole that is larger than the size of a pencil eraser.  If any of your family members has a history of skin cancer, especially at a young age, talk with your health care provider about genetic screening.  Always use sunscreen. Apply sunscreen liberally and repeatedly throughout the day.  Whenever you are outside, protect yourself by  wearing long sleeves, pants, a wide-brimmed hat, and sunglasses.  What should I know about osteoporosis? Osteoporosis is a condition in which bone destruction happens more quickly than new bone creation. After menopause, you may be at an increased risk for osteoporosis. To help prevent osteoporosis or the bone fractures that can happen because of osteoporosis, the following is recommended:  If you are 17-7 years old, get at least 1,000 mg of calcium and at least 600 mg of vitamin D per day.  If you are older than age 18 but younger than age 58, get at least 1,200 mg of calcium and at least 600 mg of vitamin D per day.  If you are older than age 14, get at least 1,200 mg of calcium and at least 800 mg of vitamin D per day.  Smoking and excessive alcohol intake increase the risk of osteoporosis. Eat foods that are rich in calcium and vitamin D, and do weight-bearing exercises several times each week as directed by your health care provider. What should I know about how menopause affects my mental health? Depression may occur at any age, but it is more common as you become older. Common symptoms of depression include:  Low or sad mood.  Changes in sleep patterns.  Changes in appetite or eating patterns.  Feeling an overall lack of motivation or enjoyment of activities that you previously enjoyed.  Frequent crying spells.  Talk with your health care provider if you think that you are experiencing depression.  Your health care provider may also recommend other immunizations. This information is not intended to replace advice given to you by your health care provider. Make sure you discuss any questions you have with your health care provider. Document Released: 09/17/2005 Document Revised: 02/13/2016 Document Reviewed: 04/29/2015 Elsevier Interactive Patient Education  2018 [employer].

## 2017-05-27 NOTE — Assessment & Plan Note (Signed)
  Chronic, at goal today of <140/90  -continue current blood pressure medications -follow up 3 months

## 2017-05-30 ENCOUNTER — Ambulatory Visit: Payer: PPO | Admitting: Family Medicine

## 2017-06-17 ENCOUNTER — Other Ambulatory Visit: Payer: Self-pay | Admitting: Family Medicine

## 2017-06-17 DIAGNOSIS — Z1231 Encounter for screening mammogram for malignant neoplasm of breast: Secondary | ICD-10-CM

## 2017-08-03 ENCOUNTER — Other Ambulatory Visit: Payer: Self-pay | Admitting: Family Medicine

## 2017-08-12 ENCOUNTER — Ambulatory Visit (INDEPENDENT_AMBULATORY_CARE_PROVIDER_SITE_OTHER): Payer: PPO | Admitting: Family Medicine

## 2017-08-12 ENCOUNTER — Other Ambulatory Visit: Payer: Self-pay

## 2017-08-12 ENCOUNTER — Encounter: Payer: Self-pay | Admitting: Family Medicine

## 2017-08-12 VITALS — BP 130/60 | HR 63 | Temp 98.8°F | Wt 148.0 lb

## 2017-08-12 DIAGNOSIS — M65312 Trigger thumb, left thumb: Secondary | ICD-10-CM

## 2017-08-12 DIAGNOSIS — I10 Essential (primary) hypertension: Secondary | ICD-10-CM | POA: Diagnosis not present

## 2017-08-12 DIAGNOSIS — L853 Xerosis cutis: Secondary | ICD-10-CM

## 2017-08-12 HISTORY — DX: Trigger thumb, left thumb: M65.312

## 2017-08-12 NOTE — Progress Notes (Signed)
    Subjective:    Patient ID: Meredith Owens, female    DOB: [birth year], 77 y.o.   MRN: [id number]   CC: follow up for BP  BP- patient is pleased w/ blood pressure reading today. She has taken her medications daily without missing doses, no complications or side effects from these that she is aware of. No chest pain, SOB, LE edema.  Dry skin- still struggling with dry itchy skin that has worsened since winter with heat on. She is using cortisone cream on 3 chronic bumps on her chest with some improvement in size and itch. She uses vaseline and shea butter on the rest of her body after showering.   Left thumb trigger finger- this is beginning to bother her, especially when thumb gets stuck when she is trying to grip things. She has had a trigger finger in her pointer finger before that resolved with injection. She would prefer to hold off on referral until dexa scan and mammogram are done  Smoking status reviewed- non-smoker  Review of Systems- see HPI   Objective:  BP 130/60   Pulse 63   Temp 98.8 F (37.1 C) (Oral)   Wt 148 lb (67.1 kg)   SpO2 99%   BMI 27.07 kg/m  Vitals and nursing note reviewed  General: well nourished, in no acute distress Neck: supple, non-tender, without lymphadenopathy Cardiac: RRR, clear S1 and S2, no murmurs, rubs, or gallops Respiratory: clear to auscultation bilaterally, no increased work of breathing Extremities: no edema or cyanosis. Skin: warm and dry, no rashes noted. Scar tissue over sternum from prior biopsy. 3 red papules on chest. Neuro: alert and oriented, no focal deficits  Assessment & Plan:    Essential hypertension, benign   Chronic, well controlled.   -continue current regimen, follow up 3 months and check BMP at that time  Dry skin  Chronic. Advised thick ointment like aquaphor or eucerin all over copiously and especially after bathing. Advised vaseline to dry areas like ears as often as needed throughout day.   Trigger  finger of left thumb  Patient would prefer to hold off on trigger finger injection until after mammo/Dexa. Asked her to call when she is ready otherwise will follow up in May.    Return in about 4 months (around 12/10/2017).   [name], DO Family Medicine Resident PGY-2

## 2017-08-12 NOTE — Patient Instructions (Addendum)
  It was great to see you today! Blood pressure looks good.   Try Aquaphor healing ointment (or Eucerin cream) for dry skin.  Otherwise vaseline is good but will cause more of a mess. You can use the vaseline all over your ears many times a day for dry skin, it won't hurt!  Jan 31st at 10:30 at breast center you will have your DEXA scan, at 11:00 you will have your mammogram.   Please let me know if you want the referral to sports medicine for thumb injection prior to your return visit to me in May- I can do this over the phone at any time.  If you have questions or concerns please do not hesitate to call at [phone].  [name], DO PGY-2, [facility] 08/12/2017 10:46 AM

## 2017-08-12 NOTE — Assessment & Plan Note (Signed)
  Chronic. Advised thick ointment like aquaphor or eucerin all over copiously and especially after bathing. Advised vaseline to dry areas like ears as often as needed throughout day.

## 2017-08-12 NOTE — Assessment & Plan Note (Signed)
  Chronic, well controlled.   -continue current regimen, follow up 3 months and check BMP at that time

## 2017-08-12 NOTE — Assessment & Plan Note (Signed)
  Patient would prefer to hold off on trigger finger injection until after mammo/Dexa. Asked her to call when she is ready otherwise will follow up in May.

## 2017-08-15 ENCOUNTER — Telehealth: Payer: Self-pay | Admitting: Family Medicine

## 2017-08-15 NOTE — Telephone Encounter (Signed)
Spoke with patient and she was inquiring what to expect during her dexa scan and what things she avoid wearing.  Answered all questions. Meredith Owens,CMA

## 2017-08-15 NOTE — Telephone Encounter (Signed)
Pt would like to talk to dr riccio.  She has some questions about a exam-gd dexa-for brittle bones.  Please advise

## 2017-09-08 ENCOUNTER — Other Ambulatory Visit: Payer: PPO

## 2017-09-08 ENCOUNTER — Ambulatory Visit
Admission: RE | Admit: 2017-09-08 | Discharge: 2017-09-08 | Disposition: A | Discharge status: Home / Self Care | Payer: PPO | Source: Ambulatory Visit | Attending: Family Medicine | Admitting: Family Medicine

## 2017-09-08 DIAGNOSIS — Z1231 Encounter for screening mammogram for malignant neoplasm of breast: Secondary | ICD-10-CM | POA: Diagnosis not present

## 2017-09-27 DIAGNOSIS — Z970 Presence of artificial eye: Secondary | ICD-10-CM | POA: Diagnosis not present

## 2017-09-27 DIAGNOSIS — Z961 Presence of intraocular lens: Secondary | ICD-10-CM | POA: Diagnosis not present

## 2017-09-27 DIAGNOSIS — H16222 Keratoconjunctivitis sicca, not specified as Sjogren's, left eye: Secondary | ICD-10-CM | POA: Diagnosis not present

## 2017-09-27 DIAGNOSIS — H401221 Low-tension glaucoma, left eye, mild stage: Secondary | ICD-10-CM | POA: Diagnosis not present

## 2017-09-30 ENCOUNTER — Ambulatory Visit
Admission: RE | Admit: 2017-09-30 | Discharge: 2017-09-30 | Disposition: A | Discharge status: Home / Self Care | Payer: PPO | Source: Ambulatory Visit | Attending: Family Medicine | Admitting: Family Medicine

## 2017-09-30 DIAGNOSIS — M85852 Other specified disorders of bone density and structure, left thigh: Secondary | ICD-10-CM | POA: Diagnosis not present

## 2017-09-30 DIAGNOSIS — E2839 Other primary ovarian failure: Secondary | ICD-10-CM

## 2017-09-30 DIAGNOSIS — Z780 Asymptomatic menopausal state: Secondary | ICD-10-CM | POA: Diagnosis not present

## 2017-10-24 ENCOUNTER — Telehealth: Payer: Self-pay | Admitting: Family Medicine

## 2017-10-24 NOTE — Telephone Encounter (Signed)
Jazmin,  Please let patient know that the scan show some osteopenia (weakened bone) but no osteoporosis and she is a low risk for hip fracture in the next 10 years. Her PCP Dr. Vanetta Shawl might start her on Calcium supplementation if she is not already on it and will discuss that with her next week.  Marjie Skiff, MD [place], PGY-2

## 2017-10-24 NOTE — Telephone Encounter (Signed)
Will forward to MD. [name],CMA  

## 2017-10-24 NOTE — Telephone Encounter (Signed)
Would like results from bone density test done on 10-05-17 at the breast center.

## 2017-10-25 NOTE — Telephone Encounter (Signed)
Patient informed of results and voiced understanding.  States that she will just wait to discuss options with provider when she comes in for her normal follow up in May.  She doesn't want to add any new medication to her regimen right now.  [name],CMA

## 2017-11-04 ENCOUNTER — Telehealth: Payer: Self-pay

## 2017-11-04 ENCOUNTER — Other Ambulatory Visit: Payer: Self-pay | Admitting: Family Medicine

## 2017-11-04 NOTE — Telephone Encounter (Signed)
[facility] faxed stating pts losartan-HCTZ medication is now on backorder and an alternate will need to be sent in to fulfill pts refill request. Please advise.

## 2017-11-08 NOTE — Telephone Encounter (Signed)
Pt calling about this again. Fleeger, Salome Spotted, CMA

## 2017-11-09 ENCOUNTER — Other Ambulatory Visit: Payer: Self-pay | Admitting: Family Medicine

## 2017-11-09 ENCOUNTER — Telehealth: Payer: Self-pay | Admitting: Family Medicine

## 2017-11-09 MED ORDER — CANDESARTAN CILEXETIL 16 MG PO TABS: 16.0000 mg | ORAL_TABLET | Freq: Every day | ORAL | 2 refills | Status: DC

## 2017-11-09 MED ORDER — HYDROCHLOROTHIAZIDE 25 MG PO TABS: 25.0000 mg | ORAL_TABLET | Freq: Every day | ORAL | 3 refills | Status: DC

## 2017-11-09 NOTE — Telephone Encounter (Signed)
Sent in rx for HCTZ 25 mg daily and new ARB - Candesartan 16 mg daily. She can pick these up today and start taking them. If patient is not tolerating crestor we can discuss at future visits. We cannot force her to adhere with medication recommendations.   [name], DO PGY-2, [facility] 11/09/2017 10:54 AM

## 2017-11-09 NOTE — Telephone Encounter (Signed)
Losartan has a 'recall' on it and she needs to have something filled to replace it asap.  SHe is out of medication.  Please call her asap to let her know what we can do, at [phone].  Patient does not want to use Crestor.  She is wanting attention immediately for this, (I told her someone would call her today).  She has not had any luck w/leaving messages on the nurse line so far.

## 2017-11-09 NOTE — Telephone Encounter (Signed)
Alternative sent in

## 2017-11-09 NOTE — Telephone Encounter (Signed)
Will forward to MD to advise. [name],CMA  

## 2017-11-09 NOTE — Telephone Encounter (Signed)
Patient informed and will plan to call us to schedule an appointment in 1 month to ensure that this medication change is working.  [name],CMA

## 2017-11-26 ENCOUNTER — Encounter (HOSPITAL_COMMUNITY): Payer: Self-pay | Admitting: Emergency Medicine

## 2017-11-26 ENCOUNTER — Ambulatory Visit (HOSPITAL_COMMUNITY)
Admission: EM | Admit: 2017-11-26 | Discharge: 2017-11-26 | Disposition: A | Discharge status: Home / Self Care | Payer: PPO | Attending: Family Medicine | Admitting: Family Medicine

## 2017-11-26 DIAGNOSIS — M7062 Trochanteric bursitis, left hip: Secondary | ICD-10-CM | POA: Diagnosis not present

## 2017-11-26 MED ORDER — LIDOCAINE HCL (PF) 2 % IJ SOLN
INTRAMUSCULAR | Status: AC
  Filled 2017-11-26: qty 2

## 2017-11-26 MED ORDER — TRIAMCINOLONE ACETONIDE 40 MG/ML IJ SUSP
INTRAMUSCULAR | Status: AC
  Filled 2017-11-26: qty 1

## 2017-11-26 MED ORDER — TRIAMCINOLONE ACETONIDE 40 MG/ML IJ SUSP
40.0000 mg | Freq: Once | INTRAMUSCULAR | Status: AC
  Administered 2017-11-26: 40 mg via INTRA_ARTICULAR

## 2017-11-26 MED ORDER — LIDOCAINE HCL (PF) 2 % IJ SOLN
2.0000 mL | Freq: Once | INTRAMUSCULAR | Status: AC
  Administered 2017-11-26: 2 mL

## 2017-11-26 NOTE — Discharge Instructions (Signed)
Ice/cold pack over area for 10-15 min twice daily.

## 2017-11-26 NOTE — ED Triage Notes (Signed)
Pt c/o L hip pain that shoots down to her L knee, denies injury. States it hurts worse with movement. Pt ambulatory.

## 2017-11-26 NOTE — ED Provider Notes (Signed)
  [facility]    CSN: [id number] Arrival date & time: 11/26/17  1934  Musculoskeletal Exam  Patient: Meredith Owens DOB: May 30, 1941  DOS: 11/26/2017  SUBJECTIVE:  Chief Complaint:   Chief Complaint  Patient presents with  . Hip Pain    Meredith Owens is a 77 y.o.  female for evaluation and treatment of L hip pain.   Onset:  2 days ago.  No injury or change in activity.  Location: L outer hip Character:  aching and sharp  Progression of issue:  is unchanged Associated symptoms: Radiates down L leg to knee Treatment: to date has been acetaminophen.   Neurovascular symptoms: no  ROS: Musculoskeletal/Extremities: +L hip pain  Past Medical History:  Diagnosis Date  . Arthritis   . Depression   . Glaucoma   . Hyperlipidemia   . Hypertension     Objective: VITAL SIGNS: BP (!) 184/70   Pulse 76   Temp 98.3 F (36.8 C)   Resp 16   SpO2 100%  Constitutional: Well formed, well developed. No acute distress. Cardiovascular: Brisk cap refill Thorax & Lungs: No accessory muscle use Musculoskeletal: L hip.   Normal active range of motion: yes.   Normal passive range of motion: yes Tenderness to palpation: yes, over greater trochanteric bursa Deformity: no Ecchymosis: no Tests positive: none Tests negative: Ober's, logroll, straight leg Neurologic: Normal sensory function. No focal deficits noted.  Psychiatric: Normal mood. Age appropriate judgment and insight. Alert & oriented x 3.    Procedure note: Greater trochanteric bursa injection Verbal consent obtained. The area of interest was palpated and demarcated with an otoscope speculum. It was cleaned with an alcohol swab. Freeze spray was used. A 27 g needle was inserted at a perpendicular angle through the area of interested. The plunger was withdrawn to ensure our placement was not in a vessel. 2 mL of 1% lidocaine without epi and 40 mg of Kenalog was injected. A bandaid was placed. The patient tolerated  the procedure well.  There were no complications noted.  A female chaperone was present during the exam.  Assessment:  Greater trochanteric bursitis of left hip  Plan: Pt reported immediate benefit with steroid. Pt's pcp can continue these injections should the pain return. Ice rec'd.  F/u with pcp prn. The patient voiced understanding and agreement to the plan.    [name], [place] 11/26/17 2121

## 2017-11-28 ENCOUNTER — Telehealth: Payer: Self-pay | Admitting: Family Medicine

## 2017-11-28 NOTE — Telephone Encounter (Signed)
Patient made an appt tomorrow morning with Dr. Erin Hearing, he was the only one with openings.  Machell Wirthlin,CMA

## 2017-11-28 NOTE — Telephone Encounter (Signed)
Will forward to MD to advise. [name],CMA  

## 2017-11-28 NOTE — Telephone Encounter (Signed)
Pt is having cramping in her thigh.  She went to urgent care and got a shot but she is still in extreme pain. She would like for dr or nurse to call her and give advice as to what she can do for pain.

## 2017-11-28 NOTE — Telephone Encounter (Signed)
She should make an appointment to be seen in clinic.

## 2017-11-29 ENCOUNTER — Encounter: Payer: Self-pay | Admitting: Family Medicine

## 2017-11-29 ENCOUNTER — Ambulatory Visit (INDEPENDENT_AMBULATORY_CARE_PROVIDER_SITE_OTHER): Payer: PPO | Admitting: Family Medicine

## 2017-11-29 VITALS — BP 140/60 | HR 66 | Temp 97.9°F | Wt 144.6 lb

## 2017-11-29 DIAGNOSIS — S76912A Strain of unspecified muscles, fascia and tendons at thigh level, left thigh, initial encounter: Secondary | ICD-10-CM

## 2017-11-29 DIAGNOSIS — S76012A Strain of muscle, fascia and tendon of left hip, initial encounter: Secondary | ICD-10-CM

## 2017-11-29 NOTE — Assessment & Plan Note (Signed)
Acute onset and crampy quality of pain that worsens with physical activity is consistent with a muscle strain. Minimal improvement of pain with corticosteroid injection makes bursitis a less likely cause of patient's current symptoms. Pain in medial thigh could be referred pain from hip, but quality of pain is inconsistent with OA or fracture. -Encouraged patient to continue to take ibuprofen and Tylenol as needed for pain control -Recommended continuing to use cold packs and heat for 10-15 minutes at a time for temporary pain relief -Counseled on return precautions, including loss of bowel/bladder control, weakness, or numbness -Consider x-ray if symptoms do not resolve in 4-6 weeks

## 2017-11-29 NOTE — Progress Notes (Signed)
S 

## 2017-11-29 NOTE — Progress Notes (Addendum)
Date of Visit: 11/29/2017   HPI:  Patient presents for a same day appointment to discuss left hip and thigh pain.  The patient first noticed the pain Saturday morning after sleeping on her left side. She had a difficult time getting out of bed due to the pain and went to [facility] Saturday evening due to the pain. She received a corticosteroid injection for greater trochanteric bursitis at this visit. Since then, she has continued to have severe cramping pain in her posterior and medial thigh that causes her to favor her left leg when walking. The pain is worse when she is walking and is causing her to put less weight on her left leg when ambulating. Applying cold packs to the area provides temporary relief, but the pain immediately comes back after removal. She has also been taking ibuprofen and Tylenol about 2x per day which provides a small amount of pain relief. She denies numbness in her lower extremities and urinary or bowel incontinence.   ROS: See HPI  PMHx: Knee osteoarthritis, hypertension  PHYSICAL EXAM: BP 140/60 (BP Location: Left Arm, Patient Position: Sitting, Cuff Size: Normal)   Pulse 66   Temp 97.9 F (36.6 C) (Oral)   Wt 144 lb 9.6 oz (65.6 kg)   SpO2 99%   BMI 26.45 kg/m  Gen: Well-appearing, sitting comfortably in chair, no acute distress Abdomen: Soft, nondistended abdomen, no CVA tenderness Neuro: grossly intact without focal deficits MSK: 5/5 strength with hip flexion, extension, abduction, adduction; 5/5 strength with knee flexion/extension, ankle dorsiflexion/plantarflexion; no sensory deficits; negative straight leg test  ASSESSMENT/PLAN:  Strain of left hip and thigh Acute onset and crampy quality of pain that worsens with physical activity is consistent with a muscle strain. Minimal improvement of pain with corticosteroid injection makes bursitis a less likely cause of patient's current symptoms. Pain in medial thigh could be referred pain from  hip, but quality of pain is inconsistent with OA or fracture. -Encouraged patient to continue to take ibuprofen and Tylenol as needed for pain control -Recommended continuing to use cold packs and heat for 10-15 minutes at a time for temporary pain relief -Counseled on return precautions, including loss of bowel/bladder control, weakness, or numbness -Consider x-ray if symptoms do not resolve in 4-6 weeks   FOLLOW UP: Follow up if symptoms do not improve or patient develops weakness, numbness, radiating pain from back, or urinary/bowel incontinence.  I was present during the exam and history and performed my own exam and agree with above  Lind Covert

## 2017-11-29 NOTE — Patient Instructions (Signed)
It is likely that you have a muscle strain causing your leg and hip pain. Continue to take Tylenol 3x per day and ibuprofen 3-4x per day as needed for pain relief. You can also continue to use ice packs to help with the pain. You can try heat as well for pain relief. Avoid sitting for prolonged periods of time to keep the muscles as relaxed as possible. This should get better within 3-4 weeks. If your pain gets worse, you start to lose control of your bowels or have urinary incontinence, or develop weakness or loss of sensation in your leg, please come back to see [place] as soon as possible.

## 2017-11-30 ENCOUNTER — Telehealth: Payer: Self-pay | Admitting: Family Medicine

## 2017-11-30 DIAGNOSIS — M25552 Pain in left hip: Secondary | ICD-10-CM

## 2017-11-30 NOTE — Telephone Encounter (Signed)
Pt called and wanted to let Dr. Vanetta Shawl know that she is having severe pain in her hip. She went to urgent care twice this weekend. The first time she was given a shot and told that she had bursitis. The second visit they told her it could be a pulled muscle. She already has an appointment on Friday 12/02/17 but just wanted Dr Vanetta Shawl to know ahead of time before she arrives.

## 2017-11-30 NOTE — Telephone Encounter (Signed)
Will forward to MD to advise.  She saw Dr. Erin Hearing yesterday for this same concern.  [name],CMA

## 2017-12-01 NOTE — Telephone Encounter (Signed)
  Please let Ms. Brunell know we'll get an x-ray of her hip before the appointment. I ordered this and she can go to [facility] today to get it done so we have the information before her appointment tomorrow. Thank you.  [name], DO PGY-2, [facility] 12/01/2017 8:47 AM

## 2017-12-01 NOTE — Telephone Encounter (Signed)
Patient informed and she will go in the morning tomorrow for her imaging and then come here for her appt with Dr. Vanetta Shawl. [name],CMA

## 2017-12-02 ENCOUNTER — Ambulatory Visit: Payer: PPO | Admitting: Family Medicine

## 2017-12-02 ENCOUNTER — Ambulatory Visit
Admission: RE | Admit: 2017-12-02 | Discharge: 2017-12-02 | Disposition: A | Discharge status: Home / Self Care | Payer: PPO | Source: Ambulatory Visit | Attending: Family Medicine | Admitting: Family Medicine

## 2017-12-02 ENCOUNTER — Other Ambulatory Visit: Payer: Self-pay

## 2017-12-02 ENCOUNTER — Telehealth: Payer: Self-pay | Admitting: Family Medicine

## 2017-12-02 ENCOUNTER — Encounter: Payer: Self-pay | Admitting: Family Medicine

## 2017-12-02 ENCOUNTER — Ambulatory Visit (INDEPENDENT_AMBULATORY_CARE_PROVIDER_SITE_OTHER): Payer: PPO | Admitting: Family Medicine

## 2017-12-02 VITALS — BP 142/58 | HR 71 | Temp 98.0°F | Wt 143.0 lb

## 2017-12-02 DIAGNOSIS — M25552 Pain in left hip: Secondary | ICD-10-CM

## 2017-12-02 DIAGNOSIS — S76912A Strain of unspecified muscles, fascia and tendons at thigh level, left thigh, initial encounter: Secondary | ICD-10-CM

## 2017-12-02 DIAGNOSIS — S76012A Strain of muscle, fascia and tendon of left hip, initial encounter: Secondary | ICD-10-CM

## 2017-12-02 DIAGNOSIS — I10 Essential (primary) hypertension: Secondary | ICD-10-CM | POA: Diagnosis not present

## 2017-12-02 DIAGNOSIS — M1612 Unilateral primary osteoarthritis, left hip: Secondary | ICD-10-CM | POA: Diagnosis not present

## 2017-12-02 MED ORDER — PREDNISONE 10 MG (21) PO TBPK: ORAL_TABLET | ORAL | 0 refills | Status: DC

## 2017-12-02 NOTE — Progress Notes (Signed)
    Subjective:    Patient ID: Meredith Owens, female    DOB: 01/31/1941, 77 y.o.   MRN: [id number]   CC: left hip pain  Had hip pain starting >1 week ago on Good Friday. She denies inciting event, trauma, injury, change in activity prior to pain. She went to [facility] where she got steroid shot to L hip bursa for presumed bursitis. She continued to have pain so came to [facility] and was dx w/ muscle strain, told to take ibuprofen and tylenol, ice/heat for pain. She has been alternating ibuprofen and tylenol, using heat with minimal relief. She can barely walk due to pain. She had called prior to appointment today and had x-rays ordered by me which were negative for acute fx but demonstrated arthritic changes in hip. She has known arthritis in knees, lumbar spine.   Endorses calf muscle cramps. Denies LE weakness, numbness or tingling. Denies low back pain. Denies loss of bladder or bowel function.  BP on replacement meds- doing okay  Smoking status reviewed- non-smoker  Review of Systems- see HPI   Objective:  BP (!) 142/58   Pulse 71   Temp 98 F (36.7 C) (Oral)   Wt 143 lb (64.9 kg)   SpO2 99%   BMI 26.16 kg/m  Vitals and nursing note reviewed  General: well nourished, in no acute distress Cardiac: RRR, clear S1 and S2, no murmurs, rubs, or gallops Respiratory: clear to auscultation bilaterally, no increased work of breathing Extremities: no edema or cyanosis. Warm, well perfused. 2+ radial and PT pulses bilaterally Skin: warm and dry, no rashes noted Neuro: alert and oriented, no focal deficits. Antalgic gait. Strength 5/5 in upper and lower extremities bilaterally.    Assessment & Plan:   1. Essential hypertension, benign BP medication switched due to recall of valsartan, she would like to go back on old combo pill if able. Asked her to call pharmacy to see if this is back in stock, would be happy to prescribe it. For now continue candesartan and HCTZ separately. Continue coreg.   2.  Strain of left hip and thigh, initial encounter Discussed x-ray findings, likely this is an arthritis flare. She has little improvement with tylenol/ibuprofen and heat/ice. Discussed trying course of oral steroids vs referral to ortho for possible hip injection. Patient prefers trying course of steroids first. Asked her to call me if pain persists. Patient verbalized understanding and agreement with plan.  Return if symptoms worsen or fail to improve.   [name], DO Family Medicine Resident PGY-2

## 2017-12-02 NOTE — Patient Instructions (Addendum)
  It was good to see you today! Sorry you are in pain.  Please take the steroids as directed on the package. This should help decrease inflammation quickly and get you feeling better. If you are still in pain after finishing the steroids call me to let me know.  Take crestor every other day and see if this helps with muscle pains.  Call your pharmacy and see if old blood pressure medicine is back in stock.   If you have questions or concerns please do not hesitate to call at [phone].  [name], DO PGY-2, [facility] 12/02/2017 3:25 PM

## 2017-12-07 ENCOUNTER — Encounter: Payer: Self-pay | Admitting: Family Medicine

## 2017-12-07 NOTE — Telephone Encounter (Signed)
Returned call to [name], she was confused about her blood pressure medications when to switch back to her combo pill of ARB-HCTZ. Clarified her questions.  She is improving with regards to her hip day by day.  [name], DO PGY-2, [facility] 12/07/2017 4:53 PM

## 2017-12-07 NOTE — Telephone Encounter (Signed)
Pt calling concerning this medication. She has some questions about which Rx was sent in to the pharmacy. Please call pt to discuss this.

## 2017-12-07 NOTE — Telephone Encounter (Signed)
Will forward to MD to advise on medication. [name],CMA

## 2017-12-12 ENCOUNTER — Ambulatory Visit (INDEPENDENT_AMBULATORY_CARE_PROVIDER_SITE_OTHER): Payer: PPO | Admitting: Internal Medicine

## 2017-12-12 ENCOUNTER — Other Ambulatory Visit: Payer: Self-pay

## 2017-12-12 ENCOUNTER — Encounter: Payer: Self-pay | Admitting: Internal Medicine

## 2017-12-12 VITALS — BP 140/60 | HR 57 | Temp 98.4°F | Wt 142.0 lb

## 2017-12-12 DIAGNOSIS — G8929 Other chronic pain: Secondary | ICD-10-CM | POA: Diagnosis not present

## 2017-12-12 DIAGNOSIS — M25552 Pain in left hip: Secondary | ICD-10-CM | POA: Diagnosis not present

## 2017-12-12 DIAGNOSIS — R7989 Other specified abnormal findings of blood chemistry: Secondary | ICD-10-CM | POA: Diagnosis not present

## 2017-12-12 NOTE — Assessment & Plan Note (Signed)
Thought to be arthritis flare given negative imaging, though only minimal improvement with steroid injection and oral steroid burst. Could be arthritis flare, or could be bursitis given TTP and pain when laying on affected side. Imaging neg, so less concern for bony etiology. As no improvement with steroids, will refer to ortho for possible joint injection. Less concern for septic joint, as no fevers or other systemic symptoms (reported fevers at least a week ago, only subjective, and have resolved) and no redness of joint. Feel that Mobic may be helpful, however Cr slightly elevated when checked 12/2016. Will check BMP today, and if Cr WNL, can start Mobic. Patient can take Tylenol in meantime. Discouraged NSAID use for now. Will call patient when BMP results returned.

## 2017-12-12 NOTE — Progress Notes (Signed)
   Subjective:   Patient: Meredith Owens       Birthdate: 08-Jun-1941       MRN: [id number]      HPI  Meredith Owens is a 77 y.o. female presenting for same day appt for L hip pain.   L hip pain First seen for this issue on 04/26. Had received steroid injection at urgent care prior to appt, and was started on brief course of steroids at appt. Has completed course and says that pain has improved somewhat, however continues to have pain. Has difficulty walking still, and says it feels as if she has to lift up her leg to be able to walk due to pain with weight bearing. Also endorses pain when laying on the affected hip. Reports pain with sitting as well. Endorses stiffnes. Subjective fevers about a week ago though this has resolved. Denies chills, nausea, vomiting. Denies redness of hip. Is not taking anything for pain currently.   Smoking status reviewed. Patient is never smoker.   Review of Systems See HPI.     Objective:  Physical Exam  Constitutional: She is oriented to person, place, and time. She appears well-developed and well-nourished. No distress.  HENT:  Head: Normocephalic and atraumatic.  Pulmonary/Chest: Effort normal. No respiratory distress.  Musculoskeletal:  TTP of L hip. Walking slowly, though is able to walk, sit, and stand without assistance.   Neurological: She is alert and oriented to person, place, and time.  Psychiatric: She has a normal mood and affect. Her behavior is normal.      Assessment & Plan:  Left hip pain Thought to be arthritis flare given negative imaging, though only minimal improvement with steroid injection and oral steroid burst. Could be arthritis flare, or could be bursitis given TTP and pain when laying on affected side. Imaging neg, so less concern for bony etiology. As no improvement with steroids, will refer to ortho for possible joint injection. Less concern for septic joint, as no fevers or other systemic symptoms (reported fevers at least a  week ago, only subjective, and have resolved) and no redness of joint. Feel that Mobic may be helpful, however Cr slightly elevated when checked 12/2016. Will check BMP today, and if Cr WNL, can start Mobic. Patient can take Tylenol in meantime. Discouraged NSAID use for now. Will call patient when BMP results returned.    Adin Hector, MD, MPH PGY-3 [facility] Pager [phone]

## 2017-12-12 NOTE — Patient Instructions (Signed)
It was nice meeting you today Ms. Blankenship!  I will call you when your bloodwork returns. If your kidney function is fine, we can begin the Mobic (pain medication). You can take Tylenol in the meantime.   I have placed a referral to the orthopedist for you. They will call you with the date and time of your appointment.   If you have any questions or concerns, please feel free to call the clinic.   Be well,  Dr. Avon Gully

## 2017-12-13 ENCOUNTER — Telehealth: Payer: Self-pay | Admitting: Internal Medicine

## 2017-12-13 LAB — BASIC METABOLIC PANEL
BUN/Creatinine Ratio: 21 (ref 12–28)
BUN: 28 mg/dL — AB (ref 8–27)
CHLORIDE: 97 mmol/L (ref 96–106)
CO2: 24 mmol/L (ref 20–29)
CREATININE: 1.31 mg/dL — AB (ref 0.57–1.00)
Calcium: 9.8 mg/dL (ref 8.7–10.3)
GFR calc Af Amer: 46 mL/min/{1.73_m2} — ABNORMAL LOW (ref >59)
GFR calc non Af Amer: 40 mL/min/{1.73_m2} — ABNORMAL LOW (ref >59)
GLUCOSE: 90 mg/dL (ref 65–99)
Potassium: 4.2 mmol/L (ref 3.5–5.2)
Sodium: 135 mmol/L (ref 134–144)

## 2017-12-13 NOTE — Telephone Encounter (Signed)
Called patient regarding Cr. Elevated at 1.30 yesterday, up from 1.09 last year. As such, will not prescribe Mobic, and discouraged patient from taking NSAIDs. Okay to continue Tylenol. Also scheduled appt with patient's PCP Dr. Vanetta Shawl to discussed elevated Cr. Patient voiced appreciation and understanding.   Adin Hector, MD, MPH PGY-3 [facility] Pager [phone]

## 2017-12-19 ENCOUNTER — Ambulatory Visit (INDEPENDENT_AMBULATORY_CARE_PROVIDER_SITE_OTHER): Payer: PPO | Admitting: Orthopaedic Surgery

## 2017-12-19 ENCOUNTER — Encounter (INDEPENDENT_AMBULATORY_CARE_PROVIDER_SITE_OTHER): Payer: Self-pay | Admitting: Orthopaedic Surgery

## 2017-12-19 DIAGNOSIS — M25552 Pain in left hip: Secondary | ICD-10-CM | POA: Diagnosis not present
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
## 2018-01-05 ENCOUNTER — Ambulatory Visit (INDEPENDENT_AMBULATORY_CARE_PROVIDER_SITE_OTHER): Payer: PPO | Admitting: Family Medicine

## 2018-01-05 ENCOUNTER — Other Ambulatory Visit: Payer: Self-pay

## 2018-01-05 ENCOUNTER — Encounter: Payer: Self-pay | Admitting: Family Medicine

## 2018-01-05 VITALS — BP 118/72 | HR 60 | Temp 98.2°F | Ht 62.0 in | Wt 141.4 lb

## 2018-01-05 DIAGNOSIS — I83893 Varicose veins of bilateral lower extremities with other complications: Secondary | ICD-10-CM | POA: Diagnosis not present

## 2018-01-05 DIAGNOSIS — R7989 Other specified abnormal findings of blood chemistry: Secondary | ICD-10-CM | POA: Diagnosis not present

## 2018-01-05 DIAGNOSIS — I10 Essential (primary) hypertension: Secondary | ICD-10-CM | POA: Diagnosis not present

## 2018-01-05 DIAGNOSIS — E7800 Pure hypercholesterolemia, unspecified: Secondary | ICD-10-CM | POA: Diagnosis not present

## 2018-01-05 NOTE — Progress Notes (Signed)
    Subjective:    Patient ID: Meredith Owens, female    DOB: 10-31-1940, 77 y.o.   MRN: [id number]   CC: elevated kidney function   Patient was told to come in to discuss elevated creatinine seen on labs at last visit. She reports she was taking NSAIDs for hip pain but stopped. Taking tylenol sparingly now. Hip is doing well, stiff after periods of inactivity but not as acutely painful as it was.   She is back on hyzaar. Also takes asa, coreg, and crestor. No other medications or supplements. Denies decreased urine output.   Smoking status reviewed- non-smoker  Review of Systems- see HPI   Objective:  BP 118/72   Pulse 60   Temp 98.2 F (36.8 C) (Oral)   Ht 5\' 2"  (1.575 m)   Wt 141 lb 6.4 oz (64.1 kg)   SpO2 99%   BMI 25.86 kg/m  Vitals and nursing note reviewed  General: well nourished, in no acute distress HEENT: normocephalic, MMM Cardiac: RRR, clear S1 and S2, no murmurs, rubs, or gallops Respiratory: clear to auscultation bilaterally, no increased work of breathing Extremities: no edema or cyanosis. Scattered enlarged veins in bilateral lower extremities that are tender to palpation Skin: warm and dry, no rashes noted Neuro: alert and oriented, no focal deficits   Assessment & Plan:    1. Elevated serum creatinine At last lab check creatinine elevated to 1.3 from baseline of 1. Unclear if this was 2/2 NSAID use in arthritis flare vs dehydration. Will repeat BMP today.  - Basic metabolic panel  2. Varicose veins of bilateral lower extremities with other complications Patient would like to explore options for removing painful veins. Instructed to wear compression socks. Referral made to [facility] - Ambulatory referral to Vascular Surgery  3. HYPERCHOLESTEROLEMIA On crestor, due for lipid panel check - Lipid panel  4. Essential hypertension, benign At goal today <140/90, continue home medication - CBC - Basic metabolic panel   Return in about 3 months (around  04/07/2018).   [name], DO Family Medicine Resident PGY-2

## 2018-01-05 NOTE — Patient Instructions (Signed)
Please get some compression socks to help with varicose veins. You do not need to get the tightest option, 10-15 mmHg would work well.  I have also referred you to [facility] for management of varicose veins. You will receive a call from our office to schedule this appointment. If you do not hear from our office in 1-2 weeks please call us to check on the status of your referral at [phone].  We will call you with your lab results.  If you have questions or concerns please do not hesitate to call at [phone].  [name], DO PGY-2, [facility] 01/05/2018 2:12 PM   Varicose Veins Varicose veins are veins that have become enlarged and twisted. They are usually seen in the legs but can occur in other parts of the body as well. What are the causes? This condition is the result of valves in the veins not working properly. Valves in the veins help to return blood from the leg to the heart. If these valves are damaged, blood flows backward and backs up into the veins in the leg near the skin. This causes the veins to become larger. What increases the risk? People who are on their feet a lot, who are pregnant, or who are overweight are more likely to develop varicose veins. What are the signs or symptoms?  Bulging, twisted-appearing, bluish veins, most commonly found on the legs.  Leg pain or a feeling of heaviness. These symptoms may be worse at the end of the day.  Leg swelling.  Changes in skin color. How is this diagnosed? A health care provider can usually diagnose varicose veins by examining your legs. Your health care provider may also recommend an ultrasound of your leg veins. How is this treated? Most varicose veins can be treated at home.However, other treatments are available for people who have persistent symptoms or want to improve the cosmetic appearance of the varicose veins. These treatment options include:  Sclerotherapy. A  solution is injected into the vein to close it off.  Laser treatment. A laser is used to heat the vein to close it off.  Radiofrequency vein ablation. An electrical current produced by radio waves is used to close off the vein.  Phlebectomy. The vein is surgically removed through small incisions made over the varicose vein.  Vein ligation and stripping. The vein is surgically removed through incisions made over the varicose vein after the vein has been tied (ligated).  Follow these instructions at home:  Do not stand or sit in one position for long periods of time. Do not sit with your legs crossed. Rest with your legs raised during the day.  Wear compression stockings as directed by your health care provider. These stockings help to prevent blood clots and reduce swelling in your legs.  Do not wear other tight, encircling garments around your legs, pelvis, or waist.  Walk as much as possible to increase blood flow.  Raise the foot of your bed at night with 2-inch blocks.  If you get a cut in the skin over the vein and the vein bleeds, lie down with your leg raised and press on it with a clean cloth until the bleeding stops. Then place a bandage (dressing) on the cut. See your health care provider if it continues to bleed. Contact a health care provider if:  The skin around your ankle starts to break down.  You have pain, redness, tenderness, or hard swelling in your  leg over a vein.  You are uncomfortable because of leg pain. This information is not intended to replace advice given to you by your health care provider. Make sure you discuss any questions you have with your health care provider. Document Released: 05/05/2005 Document Revised: 01/01/2016 Document Reviewed: 01/27/2016 Elsevier Interactive Patient Education  2017 [employer].

## 2018-01-06 ENCOUNTER — Other Ambulatory Visit: Payer: Self-pay | Admitting: Family Medicine

## 2018-01-06 ENCOUNTER — Telehealth: Payer: Self-pay | Admitting: Family Medicine

## 2018-01-06 LAB — CBC
HEMATOCRIT: 35.8 % (ref 34.0–46.6)
HEMOGLOBIN: 11.8 g/dL (ref 11.1–15.9)
MCH: 31.2 pg (ref 26.6–33.0)
MCHC: 33 g/dL (ref 31.5–35.7)
MCV: 95 fL (ref 79–97)
Platelets: 347 10*3/uL (ref 150–450)
RBC: 3.78 x10E6/uL (ref 3.77–5.28)
RDW: 14.4 % (ref 12.3–15.4)
WBC: 6.6 10*3/uL (ref 3.4–10.8)

## 2018-01-06 LAB — BASIC METABOLIC PANEL
BUN/Creatinine Ratio: 12 (ref 12–28)
BUN: 16 mg/dL (ref 8–27)
CALCIUM: 10.4 mg/dL — AB (ref 8.7–10.3)
CO2: 26 mmol/L (ref 20–29)
CREATININE: 1.29 mg/dL — AB (ref 0.57–1.00)
Chloride: 99 mmol/L (ref 96–106)
GFR calc Af Amer: 46 mL/min/{1.73_m2} — ABNORMAL LOW (ref >59)
GFR, EST NON AFRICAN AMERICAN: 40 mL/min/{1.73_m2} — AB (ref >59)
GLUCOSE: 96 mg/dL (ref 65–99)
POTASSIUM: 4.4 mmol/L (ref 3.5–5.2)
Sodium: 139 mmol/L (ref 134–144)

## 2018-01-06 LAB — LIPID PANEL
CHOL/HDL RATIO: 2.3 ratio (ref 0.0–4.4)
CHOLESTEROL TOTAL: 174 mg/dL (ref 100–199)
HDL: 77 mg/dL (ref >39)
LDL Calculated: 84 mg/dL (ref 0–99)
Triglycerides: 67 mg/dL (ref 0–149)
VLDL Cholesterol Cal: 13 mg/dL (ref 5–40)

## 2018-01-06 MED ORDER — LOSARTAN POTASSIUM 100 MG PO TABS: 100.0000 mg | ORAL_TABLET | Freq: Every day | ORAL | 0 refills | Status: DC

## 2018-01-06 NOTE — Telephone Encounter (Signed)
  Called [name] to discuss lab results. Creatinine stable from last value, slightly elevated at 1.29. BP was low-normal yesterday. Patient on combo arb-hctz. Will stop HCTZ and continue losartan alone. Follow up 1 month for BP check and repeat BMP, appointment made. Patient agreeable. She checks her BP at home and will call if any readings are high.    [name], DO PGY-2, [facility] 01/06/2018 8:58 AM

## 2018-01-09 ENCOUNTER — Other Ambulatory Visit: Payer: Self-pay

## 2018-01-09 DIAGNOSIS — I83893 Varicose veins of bilateral lower extremities with other complications: Secondary | ICD-10-CM

## 2018-01-23 DIAGNOSIS — H401221 Low-tension glaucoma, left eye, mild stage: Secondary | ICD-10-CM | POA: Diagnosis not present

## 2018-01-23 DIAGNOSIS — Z970 Presence of artificial eye: Secondary | ICD-10-CM | POA: Diagnosis not present

## 2018-01-23 DIAGNOSIS — H16222 Keratoconjunctivitis sicca, not specified as Sjogren's, left eye: Secondary | ICD-10-CM | POA: Diagnosis not present

## 2018-01-23 DIAGNOSIS — Z961 Presence of intraocular lens: Secondary | ICD-10-CM | POA: Diagnosis not present

## 2018-02-03 ENCOUNTER — Encounter: Payer: Self-pay | Admitting: Family Medicine

## 2018-02-03 ENCOUNTER — Other Ambulatory Visit: Payer: Self-pay

## 2018-02-03 ENCOUNTER — Ambulatory Visit (INDEPENDENT_AMBULATORY_CARE_PROVIDER_SITE_OTHER): Payer: PPO | Admitting: Family Medicine

## 2018-02-03 VITALS — BP 138/80 | HR 58 | Temp 98.6°F | Wt 144.0 lb

## 2018-02-03 DIAGNOSIS — I10 Essential (primary) hypertension: Secondary | ICD-10-CM | POA: Diagnosis not present

## 2018-02-03 DIAGNOSIS — N183 Chronic kidney disease, stage 3 unspecified: Secondary | ICD-10-CM

## 2018-02-03 DIAGNOSIS — R413 Other amnesia: Secondary | ICD-10-CM | POA: Diagnosis not present

## 2018-02-03 NOTE — Progress Notes (Signed)
    Subjective:    Patient ID: Meredith Owens, female    DOB: 1940-12-20, 77 y.o.   MRN: [id number]   CC: follow up BP  Last visit HCTZ was stopped, BP has remained controlled on losartan and coreg. She feels well, denies SOB, DOE, chest pain, leg swelling, decreased urination.   She also reports memory issues and some confusion about medical details from recent appointments with eye doctor. Her daughter is with her today and reports she has noticed mistakes in what her mother has told her vs what doctors have said. Daughter says she tries to go to all appointments with her mom now to avoid this. [name] states she only has confusion over medical issues. She does not get lost or forget important dates, names, memories, etc. She tries to write things down so she keeps track of them.   Smoking status reviewed- non-smoker  Review of Systems- see HPI   Objective:  BP 138/80   Pulse (!) 58   Temp 98.6 F (37 C) (Oral)   Wt 144 lb (65.3 kg)   SpO2 99%   BMI 26.34 kg/m  Vitals and nursing note reviewed  General: well nourished, in no acute distress HEENT: normocephalic, MMM Cardiac: RRR, clear S1 and S2, no murmurs, rubs, or gallops Respiratory: clear to auscultation bilaterally, no increased work of breathing Extremities: no edema or cyanosis. Skin: warm and dry, no rashes noted Neuro: alert and oriented, no focal deficits   Assessment & Plan:    1. Essential hypertension, benign Blood pressure at goal today <140/90. Continue current regimen. Follow up 3 months for BP check - Basic metabolic panel  2. Chronic kidney disease, stage 3 ([facility]) Repeat BMP today to assess creatinine after stopping HCTZ. - Basic metabolic panel  3. Memory impairment Will check basic dementia labs. Appointment made in geri clinic for further evaluation, [place]. This is likely due to normal aging, she has not had any significant events caused by impaired memory and seems to be confused over medical  jargon which is understandable.  - Vitamin B12 - CBC - RPR - TSH   Return in about 3 months (around 05/06/2018).   [name], DO Family Medicine Resident PGY-2

## 2018-02-03 NOTE — Patient Instructions (Addendum)
   It was great seeing you today!  We'll check your kidney function again today and I'll let you know about the results likely on Monday. We'll also check some labs to make sure nothing is interfering with your memory.   We'll see you back in 3 months to follow up on blood pressure.  If you have questions or concerns please do not hesitate to call at [phone].  [name], DO PGY-2, [facility] 02/03/2018 1:48 PM   Management of Memory Problems  There are some general things you can do to help manage your memory problems.  Your memory may not in fact recover, but by using techniques and strategies you will be able to manage your memory difficulties better.  1)  Establish a routine.  Try to establish and then stick to a regular routine.  By doing this, you will get used to what to expect and you will reduce the need to rely on your memory.  Also, try to do things at the same time of day, such as taking your medication or checking your calendar first thing in the morning.  Think about think that you can do as a part of a regular routine and make a list.  Then enter them into a daily planner to remind you.  This will help you establish a routine.  2)  Organize your environment.  Organize your environment so that it is uncluttered.  Decrease visual stimulation.  Place everyday items such as keys or cell phone in the same place every day (ie.  Basket next to front door)  Use post it notes with a brief message to yourself (ie. Turn off light, lock the door)  Use labels to indicate where things go (ie. Which cupboards are for food, dishes, etc.)  Keep a notepad and pen by the telephone to take messages  3)  Memory Aids  A diary or journal/notebook/daily planner  Making a list (shopping list, chore list, to do list that needs to be done)  Using an alarm as a reminder (kitchen timer or cell phone alarm)  Using cell phone to store information (Notes, Calendar,  Reminders)  Calendar/White board placed in a prominent position  Post-it notes  In order for memory aids to be useful, you need to have good habits.  It's no good remembering to make a note in your journal if you don't remember to look in it.  Try setting aside a certain time of day to look in journal.  4)  Improving mood and managing fatigue.  There may be other factors that contribute to memory difficulties.  Factors, such as anxiety, depression and tiredness can affect memory.  Regular gentle exercise can help improve your mood and give you more energy.  Simple relaxation techniques may help relieve symptoms of anxiety  Try to get back to completing activities or hobbies you enjoyed doing in the past.  Learn to pace yourself through activities to decrease fatigue.  Find out about some local support groups where you can share experiences with others.  Try and achieve 7-8 hours of sleep at night.

## 2018-02-04 LAB — BASIC METABOLIC PANEL
BUN / CREAT RATIO: 13 (ref 12–28)
BUN: 14 mg/dL (ref 8–27)
CALCIUM: 10.1 mg/dL (ref 8.7–10.3)
CHLORIDE: 107 mmol/L — AB (ref 96–106)
CO2: 25 mmol/L (ref 20–29)
Creatinine, Ser: 1.07 mg/dL — ABNORMAL HIGH (ref 0.57–1.00)
GFR calc Af Amer: 58 mL/min/{1.73_m2} — ABNORMAL LOW (ref >59)
GFR, EST NON AFRICAN AMERICAN: 51 mL/min/{1.73_m2} — AB (ref >59)
Glucose: 87 mg/dL (ref 65–99)
POTASSIUM: 4.3 mmol/L (ref 3.5–5.2)
Sodium: 144 mmol/L (ref 134–144)

## 2018-02-04 LAB — VITAMIN B12: Vitamin B-12: 506 pg/mL (ref 232–1245)

## 2018-02-04 LAB — CBC
Hematocrit: 35.5 % (ref 34.0–46.6)
Hemoglobin: 11.2 g/dL (ref 11.1–15.9)
MCH: 30.4 pg (ref 26.6–33.0)
MCHC: 31.5 g/dL (ref 31.5–35.7)
MCV: 97 fL (ref 79–97)
PLATELETS: 290 10*3/uL (ref 150–450)
RBC: 3.68 x10E6/uL — AB (ref 3.77–5.28)
RDW: 15.3 % (ref 12.3–15.4)
WBC: 6.2 10*3/uL (ref 3.4–10.8)

## 2018-02-04 LAB — TSH: TSH: 1.28 u[IU]/mL (ref 0.450–4.500)

## 2018-02-04 LAB — RPR: RPR Ser Ql: NONREACTIVE

## 2018-02-06 ENCOUNTER — Telehealth: Payer: Self-pay | Admitting: Family Medicine

## 2018-02-06 ENCOUNTER — Encounter: Payer: Self-pay | Admitting: Family Medicine

## 2018-02-06 NOTE — Telephone Encounter (Signed)
  Called Meredith Owens to discuss labs. Kidney function much better since stopping HCTZ. Other labs for dementia negative. Asked her to keep appt w/ Geri clinic which she is agreeable to. She is appreciative of call.  [name], DO PGY-2, [facility] 02/06/2018 9:32 AM

## 2018-02-13 DIAGNOSIS — Z961 Presence of intraocular lens: Secondary | ICD-10-CM | POA: Diagnosis not present

## 2018-02-13 DIAGNOSIS — H16222 Keratoconjunctivitis sicca, not specified as Sjogren's, left eye: Secondary | ICD-10-CM | POA: Diagnosis not present

## 2018-02-13 DIAGNOSIS — H401221 Low-tension glaucoma, left eye, mild stage: Secondary | ICD-10-CM | POA: Diagnosis not present

## 2018-02-13 DIAGNOSIS — Z970 Presence of artificial eye: Secondary | ICD-10-CM | POA: Diagnosis not present

## 2018-02-27 ENCOUNTER — Other Ambulatory Visit: Payer: Self-pay

## 2018-02-27 ENCOUNTER — Ambulatory Visit (HOSPITAL_COMMUNITY)
Admission: RE | Admit: 2018-02-27 | Discharge: 2018-02-27 | Disposition: A | Discharge status: Home / Self Care | Payer: PPO | Source: Ambulatory Visit | Attending: Surgery | Admitting: Surgery

## 2018-02-27 ENCOUNTER — Encounter: Payer: Self-pay | Admitting: Surgery

## 2018-02-27 ENCOUNTER — Ambulatory Visit (INDEPENDENT_AMBULATORY_CARE_PROVIDER_SITE_OTHER): Payer: PPO | Admitting: Surgery

## 2018-02-27 VITALS — BP 173/81 | HR 56 | Temp 98.0°F | Resp 18 | Ht 62.0 in | Wt 143.2 lb

## 2018-02-27 DIAGNOSIS — I83893 Varicose veins of bilateral lower extremities with other complications: Secondary | ICD-10-CM | POA: Insufficient documentation

## 2018-02-27 DIAGNOSIS — I872 Venous insufficiency (chronic) (peripheral): Secondary | ICD-10-CM | POA: Insufficient documentation

## 2018-02-27 NOTE — Progress Notes (Signed)
Vascular and Vein Specialist of [facility]  Patient name: Meredith Owens MRN: [id number] DOB: 02-25-1941 Sex: female   REQUESTING PROVIDER:    Dr. McDiarmid   REASON FOR CONSULT:    Leg pain and swelling  HISTORY OF PRESENT ILLNESS:   Meredith Owens is a 77 y.o. female, who is referred for evaluation of leg pain and swelling.  The patient states that she has bilateral leg pain after standing too long.  She endorses some swelling.  Her symptoms are worse at the end of the day.  She tries to elevate her legs but this does not help much.  She states that she will get awoken at night with cramps in her calves.  Her symptoms are improved by using an ice pack, taking mustard, and walking it off.  She has never tried compression stockings.  Patient is medically managed for hypertension with a ARB.  She takes a statin for hypercholesterolemia.  She is a non-smoker.  PAST MEDICAL HISTORY    Past Medical History:  Diagnosis Date  . Arthritis   . Depression   . Glaucoma   . Hyperlipidemia   . Hypertension      FAMILY HISTORY   Family History  Problem Relation Age of Onset  . Cancer Mother        lung  . Diabetes Daughter   . Diabetes Son   . Diabetes Son   . Colon cancer Neg Hx   . Stomach cancer Neg Hx     SOCIAL HISTORY:   Social History   Socioeconomic History  . Marital status: Widowed    Spouse name: Not on file  . Number of children: 4  . Years of education: Not on file  . Highest education level: Not on file  Occupational History  . Occupation: Retired-housekeeping, food svc    Employer: RETIRED  Social Needs  . Financial resource strain: Not on file  . Food insecurity:    Worry: Not on file    Inability: Not on file  . Transportation needs:    Medical: Not on file    Non-medical: Not on file  Tobacco Use  . Smoking status: Never Smoker  . Smokeless tobacco: Never Used  Substance and Sexual Activity  . Alcohol use: Yes     Comment: occas  . Drug use: No  . Sexual activity: Never  Lifestyle  . Physical activity:    Days per week: Not on file    Minutes per session: Not on file  . Stress: Not on file  Relationships  . Social connections:    Talks on phone: Not on file    Gets together: Not on file    Attends religious service: Not on file    Active member of club or organization: Not on file    Attends meetings of clubs or organizations: Not on file    Relationship status: Not on file  . Intimate partner violence:    Fear of current or ex partner: Not on file    Emotionally abused: Not on file    Physically abused: Not on file    Forced sexual activity: Not on file  Other Topics Concern  . Not on file  Social History Narrative   Lives alone in [place]. Senior citizen area.    4 children. Widow (husband passed on 2006-11-17).    Does not work. 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    Hobbies: Retail banker activities, church [employer]).  Health Care POA:    Emergency Contact: son, Murphy Bundick, (c) [phone]   End of Life Plan:    Who lives with you: self   Any pets: none   Diet: Pt have a variety of protein, starch and vegetables.   Exercise: Pt does not have regular routine.   Seatbelts: Pt reports wearing seatbelt when in vehicles.    Hobbies: playing cards, reading, puzzles.       ALLERGIES:    Allergies  Allergen Reactions  . Brimonidine Other (See Comments)    Red eyes  . Oxycodone Hcl     REACTION: Hives  . Prochlorperazine Edisylate     REACTION: anaphylaxis  . Latex Itching and Rash    CURRENT MEDICATIONS:    Current Outpatient Medications  Medication Sig Dispense Refill  . aspirin 81 MG tablet Take 81 mg by mouth daily.    . carvedilol (COREG) 12.5 MG tablet TAKE 1 TABLET BY MOUTH TWICE A DAY 180 tablet 2  . cycloSPORINE (RESTASIS OP) Apply to eye.    . dorzolamide (TRUSOPT) 2 % ophthalmic solution 1 drop 3 (three) times daily.    .  dorzolamide-timolol (COSOPT) 22.3-6.8 MG/ML ophthalmic solution 1 drop 2 (two) times daily.    Marland Kitchen losartan (COZAAR) 100 MG tablet Take 1 tablet (100 mg total) by mouth daily. 90 tablet 0  . ROCKLATAN 0.02-0.005 % SOLN INSTILL 1 DROP INTO LEFT EYE AT BEDTIME  3  . rosuvastatin (CRESTOR) 10 MG tablet TAKE 1 TABLET BY MOUTH EVERY DAY 90 tablet 3  . White Petrolatum-Mineral Oil ([employer]S EYE LUBRICANT OP) Apply to eye.     No current facility-administered medications for this visit.     REVIEW OF SYSTEMS:   [X]  denotes positive finding, [ ]  denotes negative finding Cardiac  Comments:  Chest pain or chest pressure:    Shortness of breath upon exertion:    Short of breath when lying flat:    Irregular heart rhythm:        Vascular    Pain in calf, thigh, or hip brought on by ambulation: c   Pain in feet at night that wakes you up from your sleep:     Blood clot in your veins:    Leg swelling:  c       Pulmonary    Oxygen at home:    Productive cough:     Wheezing:         Neurologic    Sudden weakness in arms or legs:  c   Sudden numbness in arms or legs:     Sudden onset of difficulty speaking or slurred speech:    Temporary loss of vision in one eye:  c   Problems with dizziness:         Gastrointestinal    Blood in stool:      Vomited blood:         Genitourinary    Burning when urinating:     Blood in urine:        Psychiatric    Major depression:         Hematologic    Bleeding problems:    Problems with blood clotting too easily:        Skin    Rashes or ulcers:        Constitutional    Fever or chills:     PHYSICAL EXAM:   Vitals:   02/27/18 1436  BP: (!) 173/81  Pulse: (!) 56  Resp: 18  Temp: 98 F (36.7 C)  TempSrc: Oral  SpO2: 99%  Weight: 143 lb 3.2 oz (65 kg)  Height: 5\' 2"  (1.575 m)    GENERAL: The patient is a well-nourished female, in no acute distress. The vital signs are documented above. CARDIAC: There is a regular rate and rhythm.    VASCULAR: Palpable pedal pulses bilaterally.  1+ pitting edema bilaterally PULMONARY: Nonlabored respirations MUSCULOSKELETAL: There are no major deformities or cyanosis. NEUROLOGIC: No focal weakness or paresthesias are detected. SKIN: There are no ulcers or rashes noted. PSYCHIATRIC: The patient has a normal affect.  STUDIES:   I have ordered and reviewed her vascular lab studies with the following findings Venous reflux exam:  Right: Abnormal reflux times noted in the common femoral and great saphenous vein. Left: Abnormal reflux times noted in the left common femoral and saphenous vein  No evidence of DVT  ASSESSMENT and PLAN   Bilateral leg pain: Her symptoms are somewhat atypical for venous insufficiency, however I do feel that this is at least partially contributing to her leg pain.  I do not feel that her veins meet size criteria to consider endovenous laser ablation.  We discussed that I feel she would be a good candidate for compression stockings.  I have given her information about how to get them.  She will contact me if she has any further questions.   Annamarie Major, MD Vascular and Vein Specialists of [facility] [phone] Pager [phone]

## 2018-03-06 ENCOUNTER — Encounter: Payer: Self-pay | Admitting: Family Medicine

## 2018-03-09 ENCOUNTER — Ambulatory Visit (INDEPENDENT_AMBULATORY_CARE_PROVIDER_SITE_OTHER): Payer: PPO | Admitting: Family Medicine

## 2018-03-09 ENCOUNTER — Ambulatory Visit: Payer: PPO | Attending: Family Medicine | Admitting: Physical Therapy

## 2018-03-09 ENCOUNTER — Encounter: Payer: Self-pay | Admitting: Licensed Clinical Social Worker

## 2018-03-09 ENCOUNTER — Other Ambulatory Visit: Payer: Self-pay

## 2018-03-09 VITALS — BP 142/68 | HR 69 | Ht 62.0 in | Wt 141.0 lb

## 2018-03-09 DIAGNOSIS — Z23 Encounter for immunization: Secondary | ICD-10-CM

## 2018-03-09 DIAGNOSIS — R7303 Prediabetes: Secondary | ICD-10-CM

## 2018-03-09 DIAGNOSIS — R269 Unspecified abnormalities of gait and mobility: Secondary | ICD-10-CM

## 2018-03-09 DIAGNOSIS — R252 Cramp and spasm: Secondary | ICD-10-CM

## 2018-03-09 DIAGNOSIS — M6281 Muscle weakness (generalized): Secondary | ICD-10-CM | POA: Diagnosis not present

## 2018-03-09 DIAGNOSIS — H547 Unspecified visual loss: Secondary | ICD-10-CM | POA: Diagnosis not present

## 2018-03-09 DIAGNOSIS — E559 Vitamin D deficiency, unspecified: Secondary | ICD-10-CM

## 2018-03-09 DIAGNOSIS — R2681 Unsteadiness on feet: Secondary | ICD-10-CM | POA: Insufficient documentation

## 2018-03-09 DIAGNOSIS — M25552 Pain in left hip: Secondary | ICD-10-CM | POA: Diagnosis not present

## 2018-03-09 DIAGNOSIS — I10 Essential (primary) hypertension: Secondary | ICD-10-CM | POA: Diagnosis not present

## 2018-03-09 DIAGNOSIS — M25551 Pain in right hip: Secondary | ICD-10-CM | POA: Insufficient documentation

## 2018-03-09 DIAGNOSIS — Z0000 Encounter for general adult medical examination without abnormal findings: Secondary | ICD-10-CM | POA: Diagnosis not present

## 2018-03-09 DIAGNOSIS — G3184 Mild cognitive impairment, so stated: Secondary | ICD-10-CM

## 2018-03-09 DIAGNOSIS — B0189 Other varicella complications: Secondary | ICD-10-CM | POA: Diagnosis not present

## 2018-03-09 NOTE — Patient Instructions (Signed)
Thank you for coming to our [facility]!  After testing your thinking, we believe that your DO NOT have dementia. We believe you have a condition called Mild Cognitive Impairment.   Mild Cognitive Impairment has a risk of going into dementia around 5 to 10% per year.    To prevent developing dementia, we recommend a Mediterranean Diet, exercise, especially in groups, and participating in group activities or classes.   If you start having problems with complex activities like managing your finances, let your primary care doctor know so they can re-test your thinking.   We are checking your Vitamin D level today to see if your muscle cramps might benefit from taking Vitamin D tablets.   We are checking your antibody levels against the Chicken Pox (called Varicella) to see if you had the chicken pox when you were young and might benefit form the Shingles vaccination.    We will send a note to Dr Vanetta Shawl to see if there is a reason you are on carvedilol, or could you switch from it to another blood pressure medicine like Amlodipine.  This switch might help with some of your fatigue.
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
## 2018-03-09 NOTE — Patient Instructions (Signed)
   Hip Stretch  Put right ankle over left knee. Let right knee fall downward, but keep ankle in place. Feel the stretch in hip. May push down gently with hand to feel stretch. Hold _30___ seconds while counting out loud. Repeat with other leg. Repeat _3___ times. Do _2-3___ sessions per day.    Stretching: Piriformis (Supine)  Pull right knee toward opposite shoulder. Hold _30___ seconds. Relax. Repeat ___3_ times per set. Do __1__ sets per session. Do _2-3___ sessions per day to loosen up your hips   Voncille Lo, PT Certified Exercise Expert for the Aging Adult  03/09/18 3:18 PM Phone: [phone] Fax: [phone]

## 2018-03-09 NOTE — Progress Notes (Addendum)
Type of Service: Clinical Social Work [facility] Initial Psychosocial  Demographics Meredith Owens is a 77 y.o. female . Patient is pleasant, smiling and engaged in conversation.  She is accompanied by her daughter Meredith Owens. Family/Social Information patient lives alone ,has 4 adult children.  She is very active at her senior apartment complex and enjoys going out to eat, visiting friends, church outings and playing bingo.  Caregiving Needs primary caregiver? Daughter Meredith Owens, however Meredith Owens does not live with patient Health status of caregiver:  good Physical Limitations:  none Advance Directives: Living Will or Medical POA? No , Discuss this in detail with patient and daughter. Provided a copy of [facility] Advance Directives and other resources Legal Durable POA? No. ; Is it on file at [facility] No. Discussed with patient and daughter. They will mover forward with contacting legal Aide to complete  Educational information offered to patient/family/caregiver:  1. Living Will and Medical POA,  2. Resources for Legal counsel , 3. How to support someone with memory loss  Clinical Interventions :    [employer] and Psychoeducation    Clinical Impressions/Recommendations:   No psychosocial stressors or barriers to care identified, patient has a great support system with family. Patient is independent of ADL she understands the benefit of advance directives and her daughter Meredith Owens has committed to assisting her with this .  Children transport patient appointments and other outings. No needs idenfied. Plan:  1. patient will work on Forensic scientist and bring it back during next appointment. 2. Daughter will F/U with patient in one week to make sure she has done this. 3. Patient will contact legal Aide to discuss POA.  Casimer Lanius, LCSW Licensed Clinical Social Worker [place]   [phone] 4:04 PM

## 2018-03-09 NOTE — Progress Notes (Addendum)
Patient was seen today in a co-visit with the Geriatrics Assessment Clinic Team.  Electronic medical record history indicates consistent prescription refills. Medications were reviewed with patient and daughter. The main concern expressed today is leg cramping, for which rosuvastatin dose was reduced to every other day. Her daughter states that patient has low vitamin D. Patient was referred to lab today for vitamin D level. Depending on vitamin D status and patient symptoms, can consider increasing rosuvastatin to 10 mg daily in the future.  Other findings/recommendations: Discuss carvedilol with PCP, possibly contributing to functional status and per hypertension guidelines, calcium channel blocker or thiazide recommended first line for essential hypertension. Hydrochlorothiazide was discontinued a month ago due to renal decline, so may consider switching from carvedilol to amlodipine.  Also recommended zoster vaccine today (Shingrix), however patient denies exposure to varicella zoster in the past. She deferred the Shingrix vaccine for now and requested an antibody test.
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
## 2018-03-10 ENCOUNTER — Telehealth: Payer: Self-pay | Admitting: *Deleted

## 2018-03-10 ENCOUNTER — Encounter: Payer: Self-pay | Admitting: Licensed Clinical Social Worker

## 2018-03-10 LAB — VITAMIN D 25 HYDROXY (VIT D DEFICIENCY, FRACTURES): Vit D, 25-Hydroxy: 10.7 ng/mL — ABNORMAL LOW (ref 30.0–100.0)

## 2018-03-10 LAB — VARICELLA ZOSTER ANTIBODY, IGG: VARICELLA: 566 {index} (ref >165)

## 2018-03-10 MED ORDER — ZOSTER VAC RECOMB ADJUVANTED 50 MCG/0.5ML IM SUSR: 0.5000 mL | Freq: Once | INTRAMUSCULAR | 0 refills | Status: AC

## 2018-03-10 NOTE — Telephone Encounter (Signed)
Patient informed and she will contact insurance/ pharmacy to check on her OOP cost. [name],CMA

## 2018-03-10 NOTE — Telephone Encounter (Signed)
-----   Message from Blane Ohara McDiarmid, MD sent at 03/10/2018  3:03 PM EDT ----- Regarding: Chicken pox lab result Please let Ms Parslow know that her blood test for chicken pox shows she did have chicken pox in the past.  She is at risk of developing Shingles.   We recommend her getting the Shingles vaccination series at her pharmacy.   Dr McDiarmid sent in the prescription for the Shingles vaccination to Ms [facility], [facility] [address] [facility].

## 2018-03-10 NOTE — Progress Notes (Signed)
Also reviewed home medication management and patient states she developed a system to keep up with her medications. She writes down each time she takes a dose, states that it would be helpful to get a new pill box, no other concerns at this time. Pill box was provided to patient during visit.

## 2018-03-13 ENCOUNTER — Encounter: Payer: Self-pay | Admitting: Family Medicine

## 2018-03-13 DIAGNOSIS — H547 Unspecified visual loss: Secondary | ICD-10-CM | POA: Insufficient documentation

## 2018-03-13 DIAGNOSIS — G3184 Mild cognitive impairment, so stated: Secondary | ICD-10-CM

## 2018-03-13 HISTORY — DX: Mild cognitive impairment of uncertain or unknown etiology: G31.84

## 2018-03-13 NOTE — Assessment & Plan Note (Signed)
Patient resistant to Shingrix vaccination bc she believes she never had chicken pox.  She was not convinced by the argument that well over 90 percent of the population in her age group had chicken pox as evidenced by serology studies.    She agreed to consider Shingrix if we demonstrated with a Varicella serology that she had chicken pox in past.  Her Varicella IgG Serologies were strongly positive for past infection with Varicella Zoster virus.  We have contacted her with the results and recommended Shingrix vaccination.  Order for Shingrix sent to her pharmacy.

## 2018-03-13 NOTE — Assessment & Plan Note (Signed)
I agree with recommendation with assessment of patient for Vit D deficiency. Supplementation of Vitamin in a deficient state could help with muscle issues.  Vit D level drawn.

## 2018-03-13 NOTE — Assessment & Plan Note (Signed)
Recurrent diagnosis. First diagnosed by [facility] resident A. Wright in 11/2013. Lifestyle changes over medications

## 2018-03-13 NOTE — Assessment & Plan Note (Addendum)
Meredith Owens's MoCA score of 20 out of 30 with evidence of impairment in memory, visuospatial and attention domains. These impaired cognitive domains has not manifested themselves in Meredith Owens's ability to maintain herself independently in the community, nor impacted her basic activities of daily living. This combination of lower mild lowering of MoCA score and preservation of iADLs and ADLs is consistent with Mild Cognitive Impairment.  We discussed the nature of MCI, including the increased risk of progression to dementia, the effective interventions to prevent progression including a mediterranean diet, exercise,and participation in group activities.  Control of cardiovascular risk factors may also slow the progression of MCI to dementia.

## 2018-03-13 NOTE — Assessment & Plan Note (Signed)
Pharmacy asked if there is a compelling reason for use of carvedilol for hypertension in the patient. Given its second level recommendation for use in hypertension (especially in older adults), they recommended considering use of amlodipine instead of carvedilol, unless there is a compelling reason for this agent.

## 2018-03-13 NOTE — Assessment & Plan Note (Signed)
PT evaluation of left hip pain revealed tightness in hip motion and decreased muscle strength. I agree with recommendation for PT therapy at [facility] PT office.  Order made.

## 2018-03-13 NOTE — Progress Notes (Signed)
I have interviewed and examined the patient.  I have discussed the case and verified the key findings with Dr. Latina Craver.   I agree with their assessments and plans as documented in their note for today.   Assessment/Plan Visit Problem List with A/P  Mild cognitive impairment with memory loss Meredith Owens's MoCA score of 20 out of 30 with evidence of impairment in memory, visuospatial and attention domains. These impaired cognitive domains has not manifested themselves in Meredith Owens's ability to maintain herself independently in the community, nor impacted her basic activities of daily living. This combination of lower mild lowering of MoCA score and preservation of iADLs and ADLs is consistent with Mild Cognitive Impairment.  We discussed the nature of MCI, including the increased risk of progression to dementia, the effective interventions to prevent progression including a mediterranean diet, exercise,and participation in group activities.  Control of cardiovascular risk factors may also slow the progression of MCI to dementia.       Bilateral leg cramps I agree with recommendation with assessment of patient for Vit D deficiency. Supplementation of Vitamin in a deficient state could help with muscle issues.  Vit D level drawn.   Left hip pain PT evaluation of left hip pain revealed tightness in hip motion and decreased muscle strength. I agree with recommendation for PT therapy at [facility] PT office.  Order made.   Prediabetes Recurrent diagnosis. First diagnosed by [facility] resident A. Wright in 11/2013. Lifestyle changes over medications  Healthcare maintenance Patient resistant to Shingrix vaccination bc she believes she never had chicken pox.  She was not convinced by the argument that well over 90 percent of the population in her age group had chicken pox as evidenced by serology studies.    She agreed to consider Shingrix if we demonstrated with a Varicella serology that she had  chicken pox in past.  Her Varicella IgG Serologies were strongly positive for past infection with Varicella Zoster virus.  We have contacted her with the results and recommended Shingrix vaccination.  Order for Shingrix sent to her pharmacy.   Essential hypertension, benign Pharmacy asked if there is a compelling reason for use of carvedilol for hypertension in the patient. Given its second level recommendation for use in hypertension (especially in older adults), they recommended considering use of amlodipine instead of carvedilol, unless there is a compelling reason for this agent.    60 minutes face to face where spent in total with counseling / coordination of care took more than 20 minutes of the total time. The problem list and recommendations for the patient was discussed among the four disciplines prior to meeting with the patient and their family/friend/caretaker.  Each discipline meet with the patient and family/friend/caretaker to assess the issue particular to their disciplines.   Counseling involved a group meeting of the four disciplines with the patient and their family/friend/caretaker to discuss their finding, and recommendations.  Feedback was elicited from the patient and caretaker as to the findings and recommendations.

## 2018-03-16 ENCOUNTER — Encounter: Payer: Self-pay | Admitting: Physical Therapy

## 2018-03-20 ENCOUNTER — Other Ambulatory Visit: Payer: Self-pay | Admitting: Family Medicine

## 2018-03-21 ENCOUNTER — Telehealth: Payer: Self-pay | Admitting: Family Medicine

## 2018-03-21 NOTE — Telephone Encounter (Signed)
Encounter generated in error

## 2018-03-30 MED ORDER — CHOLECALCIFEROL 50 MCG (2000 UT) PO CAPS: 2000.0000 [IU] | ORAL_CAPSULE | Freq: Every day | ORAL | 3 refills | Status: DC

## 2018-03-30 NOTE — Progress Notes (Addendum)
Prescription sent for treatment of Vitamin D deficiency. Best dose not established in elderly, estimated increase in serum vitamin D by 1.0 ng/mL per 100 units of vitamin D supplementation. Avoiding very high doses of vitamin D (e.g. > 60,000-100,000 units per month) due to J-curve in the literature suggesting association with increased fall/fracture risk in elderly patients. Therefore, initiated 2000 units daily.  Vitamin D was reviewed with the patient, including name, instructions, indication, goals of therapy, potential side effects, importance of adherence, and safe use.  Patient verbalized understanding by repeating back information and was advised to contact me if medication-related questions arise.

## 2018-03-30 NOTE — Addendum Note (Signed)
Addended by: Forde Dandy on: 03/30/2018 10:47 PM   Modules accepted: Orders

## 2018-04-19 ENCOUNTER — Telehealth: Payer: Self-pay

## 2018-04-19 NOTE — Telephone Encounter (Signed)
Pt called back with BP today, 171/69 pulse 63. I informed patient to keep her apt for tomorrow and take BP meds as usual. ED precautions given.

## 2018-04-19 NOTE — Telephone Encounter (Signed)
Pt called nurse line requesting an apt for blood pressure issues.  Sunday AM BP: 200s/80s Monday AM BP: 160/65  Pt has been taking her losartan 100mg  every morning, denies missing a dose. Pt denies headaches or blurry vision. I have scheduled her an apt for tomorrow. In the meantime, pt instructed to check her BP for today and call me with readings.

## 2018-04-19 NOTE — Telephone Encounter (Signed)
Thank you :)

## 2018-04-20 ENCOUNTER — Ambulatory Visit (INDEPENDENT_AMBULATORY_CARE_PROVIDER_SITE_OTHER): Payer: PPO | Admitting: Student in an Organized Health Care Education/Training Program

## 2018-04-20 ENCOUNTER — Ambulatory Visit (HOSPITAL_COMMUNITY)
Admission: RE | Admit: 2018-04-20 | Discharge: 2018-04-20 | Disposition: A | Discharge status: Home / Self Care | Payer: PPO | Source: Ambulatory Visit | Attending: Family Medicine | Admitting: Family Medicine

## 2018-04-20 ENCOUNTER — Other Ambulatory Visit: Payer: Self-pay

## 2018-04-20 VITALS — BP 220/78 | HR 64 | Temp 98.1°F | Ht 62.0 in | Wt 139.6 lb

## 2018-04-20 DIAGNOSIS — I498 Other specified cardiac arrhythmias: Secondary | ICD-10-CM | POA: Diagnosis not present

## 2018-04-20 DIAGNOSIS — I10 Essential (primary) hypertension: Secondary | ICD-10-CM | POA: Insufficient documentation

## 2018-04-20 DIAGNOSIS — R9431 Abnormal electrocardiogram [ECG] [EKG]: Secondary | ICD-10-CM | POA: Diagnosis not present

## 2018-04-20 MED ORDER — AMLODIPINE BESYLATE 10 MG PO TABS
10.0000 mg | ORAL_TABLET | Freq: Every day | ORAL | 3 refills | Status: DC
Start: 2018-04-20 — End: 2019-03-06

## 2018-04-20 NOTE — Patient Instructions (Signed)
It was a pleasure seeing you today in our clinic.  Here is the treatment plan we have discussed and agreed upon together:  We drew blood work at today's visit. I will call or send you a letter with these results. If you do not hear from me within the next week, please give our office a call.  If you develop blurry vision, chest pain, or weakness please contact our office immediately or go to the emergency room.   Our clinic's number is [phone]. Please call with questions or concerns about what we discussed today.  Be well, Dr. Burr Medico

## 2018-04-20 NOTE — Progress Notes (Signed)
Subjective:    Meredith Owens - 77 y.o. female MRN [id number]  Date of birth: 1941-03-07  HPI  Meredith Owens is here for elevated blood pressure.  Patient reports that for the last week she has noted elevated blood pressures.  She has had headaches.  She has not had any blurry vision, dyspnea, or chest pain.  She is been taking her Coreg 12.5 mg twice daily and her losartan 100 mg daily.  She does have HCTZ in the past, however this was discontinued due to cramps. No known trigger of sudden elevation in pressrues.   Health Maintenance:  Health Maintenance Due  Topic Date Due  . TETANUS/TDAP  04/09/2016  . INFLUENZA VACCINE  03/09/2018    -  reports that she has never smoked. She has never used smokeless tobacco. - Review of Systems: Per HPI. - Past Medical History: Patient Active Problem List   Diagnosis Date Noted  . Pain in joint of right shoulder 04/26/2018  . Mild cognitive impairment with memory loss 03/13/2018  . Visual impairment 03/13/2018  . Left hip pain 12/12/2017  . Dry skin 04/08/2016  . Chronic kidney disease, stage 3 ([place]) 02/17/2015  . Glaucoma, left eye 01/22/2015  . Prediabetes 11/20/2013  . Bilateral leg cramps 07/28/2012  . Healthcare maintenance 05/30/2012  . Primary osteoarthritis of both knees 06/21/2011  . Vitreous detachment 11/02/2010  . HYPERCHOLESTEROLEMIA 11/17/2006  . DEPRESSIVE DISORDER, NOS 10/06/2006  . Essential hypertension, benign 10/06/2006   - Medications: reviewed and updated Current Outpatient Medications  Medication Sig Dispense Refill  . amLODipine (NORVASC) 10 MG tablet Take 1 tablet (10 mg total) by mouth daily. 90 tablet 3  . aspirin 81 MG tablet Take 81 mg by mouth daily.    . carvedilol (COREG) 12.5 MG tablet TAKE 1 TABLET BY MOUTH TWICE A DAY 180 tablet 2  . Cholecalciferol 2000 units CAPS Take 1 capsule (2,000 Units total) by mouth daily. 30 each 3  . cycloSPORINE (RESTASIS OP) Apply to eye.    . dorzolamide-timolol  (COSOPT) 22.3-6.8 MG/ML ophthalmic solution 1 drop 2 (two) times daily.    Marland Kitchen losartan (COZAAR) 100 MG tablet TAKE 1 TABLET BY MOUTH EVERY DAY 90 tablet 3  . ROCKLATAN 0.02-0.005 % SOLN INSTILL 1 DROP INTO LEFT EYE AT BEDTIME  3  . rosuvastatin (CRESTOR) 10 MG tablet TAKE 1 TABLET BY MOUTH EVERY DAY 90 tablet 3  . White Petrolatum-Mineral Oil ([employer] EYE LUBRICANT OP) Apply to eye.     No current facility-administered medications for this visit.     Review of Systems See HPI     Objective:   Physical Exam BP (!) 220/78   Pulse 64   Temp 98.1 F (36.7 C) (Oral)   Ht 5\' 2"  (1.575 m)   Wt 139 lb 9.6 oz (63.3 kg)   SpO2 99%   BMI 25.53 kg/m  Gen: NAD, alert, cooperative with exam, well-appearing  CV: RRR, good S1/S2, no murmur, no edema, capillary refill brisk  Resp: CTABL, no wheezes, non-labored Abd: SNTND, BS present, no guarding or organomegaly Skin: no rashes, normal turgor  Neuro: no gross deficits.  Psych: good insight, alert and oriented     Assessment & Plan:   Hypertensive urgency 1. Essential hypertension No chest pain, blurry vision. Will check CMP and EKG to rule out evidence of end organ damage. Start Norvasc. Check TSH, check CBC to rule out anemia. Patient to schedule follow up to be seen in our office on Monday.  Red flags/reasons to return to care discussed with patient and her son. - EKG 12-Lead - amLODipine (NORVASC) 10 MG tablet; Take 1 tablet (10 mg total) by mouth daily.  Dispense: 90 tablet; Refill: 3 - CBC - Comprehensive metabolic panel - TSH  Everrett Coombe, MD,MS,  PGY3 04/26/2018 5:06 PM

## 2018-04-24 ENCOUNTER — Other Ambulatory Visit: Payer: Self-pay

## 2018-04-24 ENCOUNTER — Ambulatory Visit (INDEPENDENT_AMBULATORY_CARE_PROVIDER_SITE_OTHER): Payer: PPO | Admitting: Family Medicine

## 2018-04-24 VITALS — BP 180/58 | HR 61 | Temp 97.9°F | Ht 62.0 in | Wt 137.4 lb

## 2018-04-24 DIAGNOSIS — I10 Essential (primary) hypertension: Secondary | ICD-10-CM

## 2018-04-24 DIAGNOSIS — M25511 Pain in right shoulder: Secondary | ICD-10-CM | POA: Diagnosis not present

## 2018-04-24 NOTE — Patient Instructions (Signed)
It was a pleasure to see you today! Thank you for choosing [facility] for your primary care. Meredith Owens was seen for blood pressure check up and labs that were missed on prior visit. Come back to the clinic if to see Dr. Vanetta Shawl in a few weeks, and go to the emergency room if you have any emergency symptoms.  Today we talked about how your BP is doing well at home (140s) but that it always seems higher when you come to clinic.  You seem to be tolerating the amodipine well.  We'll run those labs and you can discuss them and any new meds with Dr. Vanetta Shawl.  Please do the stretches for your range of motion in your arms like we talked about.    If we did any lab work today that did not result today, one of two things will happen.  1. If everything is normal, you will get a letter in mail sent to the address in your chart with the results for your records.  It is important to keep your address up to date as that is where we will send results.  2. If the results require some sort of discussion, my nurses or myself will call you on the phone number listed in your records.  It is important to keep your phone number up to date in our system as this is how we will try to reach you.  If we cannot reach you on the phone, we will try to send you a letter in the mail so please enable to voicemail function of your phone.  If you don't hear from [place] in two weeks, please give [place] a call to verify your results. Otherwise, we look forward to seeing you again at your next visit. If you have any questions or concerns before then, please call the clinic at [phone].   Please bring all your medications to every doctors visit   Sign up for My Chart to have easy access to your labs results, and communication with your Primary care physician.     Please check-out at the front desk before leaving the clinic.     Best,  Dr. Sherene Sires FAMILY MEDICINE RESIDENT - PGY2 04/24/2018 11:52 AM

## 2018-04-25 ENCOUNTER — Encounter: Payer: Self-pay | Admitting: Family Medicine

## 2018-04-25 LAB — COMPREHENSIVE METABOLIC PANEL
A/G RATIO: 2 (ref 1.2–2.2)
ALBUMIN: 4.6 g/dL (ref 3.5–4.8)
ALK PHOS: 65 IU/L (ref 39–117)
ALT: 13 IU/L (ref 0–32)
AST: 19 IU/L (ref 0–40)
BILIRUBIN TOTAL: 0.3 mg/dL (ref 0.0–1.2)
BUN / CREAT RATIO: 12 (ref 12–28)
BUN: 13 mg/dL (ref 8–27)
CO2: 25 mmol/L (ref 20–29)
Calcium: 10.3 mg/dL (ref 8.7–10.3)
Chloride: 104 mmol/L (ref 96–106)
Creatinine, Ser: 1.06 mg/dL — ABNORMAL HIGH (ref 0.57–1.00)
GFR calc Af Amer: 59 mL/min/{1.73_m2} — ABNORMAL LOW (ref >59)
GFR calc non Af Amer: 51 mL/min/{1.73_m2} — ABNORMAL LOW (ref >59)
GLUCOSE: 97 mg/dL (ref 65–99)
Globulin, Total: 2.3 g/dL (ref 1.5–4.5)
POTASSIUM: 4.2 mmol/L (ref 3.5–5.2)
SODIUM: 142 mmol/L (ref 134–144)
Total Protein: 6.9 g/dL (ref 6.0–8.5)

## 2018-04-25 LAB — TSH: TSH: 1.44 u[IU]/mL (ref 0.450–4.500)

## 2018-04-25 LAB — CBC
Hematocrit: 37.1 % (ref 34.0–46.6)
Hemoglobin: 11.9 g/dL (ref 11.1–15.9)
MCH: 30.1 pg (ref 26.6–33.0)
MCHC: 32.1 g/dL (ref 31.5–35.7)
MCV: 94 fL (ref 79–97)
PLATELETS: 267 10*3/uL (ref 150–450)
RBC: 3.96 x10E6/uL (ref 3.77–5.28)
RDW: 12.6 % (ref 12.3–15.4)
WBC: 6.5 10*3/uL (ref 3.4–10.8)

## 2018-04-25 NOTE — Progress Notes (Signed)
Labs reviewed, stable, letter sent to patient

## 2018-04-26 DIAGNOSIS — M25511 Pain in right shoulder: Secondary | ICD-10-CM

## 2018-04-26 HISTORY — DX: Pain in right shoulder: M25.511

## 2018-04-26 NOTE — Assessment & Plan Note (Signed)
Patient with chronic pain in right shoulder, no acute trauma.  She does note that pain has been inhibiting her desire to use arm and she has reduced ROM now.  Less pain with passive ROM than active.  ROM restricted to ~90 abduction and flexion.  Patient advised to work on passive ROM exercises (wall walking) each day and we discussed "frozen shoulder"

## 2018-04-26 NOTE — Assessment & Plan Note (Signed)
Always higher in clinic than at home, will recheck labs and allow further discussion w/ pcp  Update: labs unremarkable, pcp aware

## 2018-04-26 NOTE — Progress Notes (Signed)
Subjective:  Meredith Owens is a 77 y.o. female who presents to the [facility] today with a chief complaint of blood pressure check.   HPI: Patient with chronic white coat hypertension.  Has been taking meds regularly as prescribed.  At home sbps have been 140s and below.  She says she's always higher in the clinic.  No chest pain/SOB/stroke symptoms.  Feeling well excpet for some MSK shoulder pain  We discussed shoulder and she has had chronic pain for years that causes her to favor her left.  No acute trauma and no fever/infection/wound claimed.  Is worried about reduced ROM that she is  Concerned comes from not using arm much.  She denies radiating pain to hand/elbow and any paralysis/paresthesia distally.   Objective:  Physical Exam: BP (!) 180/58   Pulse 61   Temp 97.9 F (36.6 C) (Oral)   Ht 5\' 2"  (1.575 m)   Wt 137 lb 6.4 oz (62.3 kg)   SpO2 99%   BMI 25.13 kg/m   Gen: NAD, resting comfortably CV: RRR with no murmurs appreciated Pulm: NWOB, CTAB with no crackles, wheezes, or rhonchi GI: Normal bowel sounds present. Soft, Nontender, Nondistended. MSK: no edema, cyanosis, or clubbing noted.  No obvious deformity to palption of right shoulder and no specific pain to palpation.  Does have reduced ROM to ~90 abduction and flexion, same ROM but less pain with passive movement.  No distal neurological findings Skin: warm, dry Neuro: grossly normal, moves all extremities Psych: Normal affect and thought content  Results for orders placed or performed in visit on 04/24/18 (from the past 72 hour(s))  TSH     Status: None   Collection Time: 04/24/18 11:28 AM  Result Value Ref Range   TSH 1.440 0.450 - 4.500 uIU/mL  Comprehensive metabolic panel     Status: Abnormal   Collection Time: 04/24/18 11:28 AM  Result Value Ref Range   Glucose 97 65 - 99 mg/dL   BUN 13 8 - 27 mg/dL   Creatinine, Ser 1.06 (H) 0.57 - 1.00 mg/dL   GFR calc non Af Amer 51 (L) >59 mL/min/1.73   GFR calc Af Amer  59 (L) >59 mL/min/1.73   BUN/Creatinine Ratio 12 12 - 28   Sodium 142 134 - 144 mmol/L   Potassium 4.2 3.5 - 5.2 mmol/L   Chloride 104 96 - 106 mmol/L   CO2 25 20 - 29 mmol/L   Calcium 10.3 8.7 - 10.3 mg/dL   Total Protein 6.9 6.0 - 8.5 g/dL   Albumin 4.6 3.5 - 4.8 g/dL   Globulin, Total 2.3 1.5 - 4.5 g/dL   Albumin/Globulin Ratio 2.0 1.2 - 2.2   Bilirubin Total 0.3 0.0 - 1.2 mg/dL   Alkaline Phosphatase 65 39 - 117 IU/L   AST 19 0 - 40 IU/L   ALT 13 0 - 32 IU/L  CBC     Status: None   Collection Time: 04/24/18 11:28 AM  Result Value Ref Range   WBC 6.5 3.4 - 10.8 x10E3/uL   RBC 3.96 3.77 - 5.28 x10E6/uL   Hemoglobin 11.9 11.1 - 15.9 g/dL   Hematocrit 37.1 34.0 - 46.6 %   MCV 94 79 - 97 fL   MCH 30.1 26.6 - 33.0 pg   MCHC 32.1 31.5 - 35.7 g/dL   RDW 12.6 12.3 - 15.4 %   Platelets 267 150 - 450 x10E3/uL     Assessment/Plan:  Pain in joint of right shoulder Patient with  chronic pain in right shoulder, no acute trauma.  She does note that pain has been inhibiting her desire to use arm and she has reduced ROM now.  Less pain with passive ROM than active.  ROM restricted to ~90 abduction and flexion.  Patient advised to work on passive ROM exercises (wall walking) each day and we discussed "frozen shoulder"  Essential hypertension, benign Always higher in clinic than at home, will recheck labs and allow further discussion w/ pcp  Update: labs unremarkable, pcp aware   Sherene Sires, [place] - PGY2 04/26/2018 11:59 AM

## 2018-05-01 ENCOUNTER — Telehealth: Payer: Self-pay | Admitting: *Deleted

## 2018-05-01 NOTE — Telephone Encounter (Signed)
Thank you :)

## 2018-05-01 NOTE — Telephone Encounter (Signed)
Pain in right temple x 1 day (sometimes "shoots to the back quickly and then gone").  Pain in temple comes and goes.  She is not dizzy but does feel fatigued. Denies CP or SOB.  She feels this is coming from the recent medication changes.  She has appt with Dr. Vanetta Shawl on 05/08/18 and does not feel she needs sooner appt, but agrees to call if HA worsens or CP/SOB develops.    Will forward to MD Maleea Camilo, Salome Spotted, [place]

## 2018-05-08 ENCOUNTER — Ambulatory Visit: Payer: PPO | Admitting: Family Medicine

## 2018-05-22 ENCOUNTER — Other Ambulatory Visit: Payer: Self-pay

## 2018-05-22 ENCOUNTER — Encounter: Payer: Self-pay | Admitting: Family Medicine

## 2018-05-22 ENCOUNTER — Ambulatory Visit (INDEPENDENT_AMBULATORY_CARE_PROVIDER_SITE_OTHER): Payer: PPO | Admitting: Family Medicine

## 2018-05-22 VITALS — BP 150/60 | HR 67 | Temp 98.2°F | Wt 137.0 lb

## 2018-05-22 DIAGNOSIS — I10 Essential (primary) hypertension: Secondary | ICD-10-CM | POA: Diagnosis not present

## 2018-05-22 DIAGNOSIS — Z23 Encounter for immunization: Secondary | ICD-10-CM

## 2018-05-22 NOTE — Progress Notes (Signed)
    Subjective:    Patient ID: Meredith Owens, female    DOB: 04-03-1941, 77 y.o.   MRN: [id number]   CC: follow up BP and HA  BP- doing better, at home her numbers have been lower, 173V systolic. She feels like her BP is always higher when she is in clinic and has white coat hypertension on problem list. She is doing well overall.   Headaches- have resolved since on new BP medication. She has not had one in several weeks. They previously were in her R temporal region described as a shooting pain that occasionally radiated to the back of her head. They were not accompanied by vision changes.  Smoking status reviewed- never smoker  Review of Systems- no chest pain, SOB, headaches, vision changes   Objective:  BP (!) 150/60   Pulse 67   Temp 98.2 F (36.8 C) (Oral)   Wt 137 lb (62.1 kg)   SpO2 99%   BMI 25.06 kg/m  Vitals and nursing note reviewed  General: well nourished, in no acute distress HEENT: normocephalic, MMM Cardiac: RRR, clear S1 and S2, no murmurs, rubs, or gallops Respiratory: clear to auscultation bilaterally, no increased work of breathing Extremities: no edema Neuro: alert and oriented, no focal deficits   Assessment & Plan:    Essential hypertension, benign  Patient not quite at goal per clinic reading however home readings at goal <670/14 with systolic BP in the 103'U. Believe there is a component of white coat hypertension elevating BP. Will not make changes at this time. Patient is asymptomatic, she previously had been having headaches that may have been related when BP was elevated. These have resolved. Will have her follow up for BP check in nurse clinic in 1 month and with me in office in 3 months. Asked her to return sooner if she experiences headaches again or if her home BP readings are elevated. Patient verbalized understanding and agreement with plan.     Return in about 3 months (around 08/22/2018) for HTN.   [name], DO Family Medicine  Resident PGY-3

## 2018-05-22 NOTE — Patient Instructions (Signed)
  It was great seeing you today! We'll see you back in 1 month to check BP. Please return to be seen if your headaches return.   If you have questions or concerns please do not hesitate to call at [phone].  [name], DO PGY-3, [facility] 05/22/2018 2:04 PM

## 2018-05-23 ENCOUNTER — Encounter: Payer: Self-pay | Admitting: Family Medicine

## 2018-05-23 NOTE — Assessment & Plan Note (Addendum)
  Patient not quite at goal per clinic reading however home readings at goal <403/52 with systolic BP in the 481'Y. Believe there is a component of white coat hypertension elevating BP. Will not make changes at this time. Patient is asymptomatic, she previously had been having headaches that may have been related when BP was elevated. These have resolved. Will have her follow up for BP check in nurse clinic in 1 month and with me in office in 3 months. Asked her to return sooner if she experiences headaches again or if her home BP readings are elevated. Patient verbalized understanding and agreement with plan.

## 2018-06-16 DIAGNOSIS — H401221 Low-tension glaucoma, left eye, mild stage: Secondary | ICD-10-CM | POA: Diagnosis not present

## 2018-06-16 DIAGNOSIS — H16222 Keratoconjunctivitis sicca, not specified as Sjogren's, left eye: Secondary | ICD-10-CM | POA: Diagnosis not present

## 2018-06-16 DIAGNOSIS — Z961 Presence of intraocular lens: Secondary | ICD-10-CM | POA: Diagnosis not present

## 2018-06-16 DIAGNOSIS — Z970 Presence of artificial eye: Secondary | ICD-10-CM | POA: Diagnosis not present

## 2018-06-20 ENCOUNTER — Ambulatory Visit: Payer: PPO | Admitting: Family Medicine

## 2018-06-30 ENCOUNTER — Ambulatory Visit (INDEPENDENT_AMBULATORY_CARE_PROVIDER_SITE_OTHER): Payer: PPO | Admitting: Family Medicine

## 2018-06-30 ENCOUNTER — Other Ambulatory Visit: Payer: Self-pay

## 2018-06-30 VITALS — BP 158/68 | HR 67 | Temp 98.3°F | Wt 138.0 lb

## 2018-06-30 DIAGNOSIS — S92504A Nondisplaced unspecified fracture of right lesser toe(s), initial encounter for closed fracture: Secondary | ICD-10-CM | POA: Diagnosis not present

## 2018-06-30 MED ORDER — ACETAMINOPHEN ER 650 MG PO TBCR: 650.0000 mg | EXTENDED_RELEASE_TABLET | Freq: Three times a day (TID) | ORAL | 0 refills | Status: DC | PRN

## 2018-06-30 MED ORDER — FINGER-TOE SPLINT MISC: 1.0000 | 0 refills | Status: DC | PRN

## 2018-06-30 MED ORDER — IBUPROFEN 400 MG PO TABS: 400.0000 mg | ORAL_TABLET | Freq: Four times a day (QID) | ORAL | 0 refills | Status: DC | PRN

## 2018-06-30 NOTE — Patient Instructions (Signed)
It was a pleasure to see you today! Thank you for choosing [facility] for your primary care. Meredith Owens was seen for right toe pain. You likely have a small fracture.  I am prescribing you ibuprofen that you can alternate with tylenol. Wear the toe brace to prevent movement.   Best,  Marny Lowenstein, MD, MS FAMILY MEDICINE RESIDENT - PGY2 06/30/2018 2:40 PM

## 2018-06-30 NOTE — Assessment & Plan Note (Signed)
Patient likely has closed fracture given point tenderness.  Plan to conservative management with - local brace - low-dose NSAID alternating with acetaminophen - Patient return as needed.
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
## 2018-07-21 ENCOUNTER — Other Ambulatory Visit: Payer: Self-pay | Admitting: Family Medicine

## 2018-07-21 ENCOUNTER — Ambulatory Visit: Payer: PPO | Admitting: Family Medicine

## 2018-07-21 DIAGNOSIS — E559 Vitamin D deficiency, unspecified: Secondary | ICD-10-CM

## 2018-08-03 ENCOUNTER — Other Ambulatory Visit: Payer: Self-pay | Admitting: Family Medicine

## 2018-08-03 DIAGNOSIS — Z1231 Encounter for screening mammogram for malignant neoplasm of breast: Secondary | ICD-10-CM

## 2018-08-18 ENCOUNTER — Encounter: Payer: Self-pay | Admitting: Family Medicine

## 2018-08-18 ENCOUNTER — Other Ambulatory Visit: Payer: Self-pay

## 2018-08-18 ENCOUNTER — Ambulatory Visit (INDEPENDENT_AMBULATORY_CARE_PROVIDER_SITE_OTHER): Payer: PPO | Admitting: Family Medicine

## 2018-08-18 VITALS — BP 128/60 | HR 57 | Temp 98.4°F | Ht 62.0 in | Wt 138.4 lb

## 2018-08-18 DIAGNOSIS — I10 Essential (primary) hypertension: Secondary | ICD-10-CM

## 2018-08-18 DIAGNOSIS — E559 Vitamin D deficiency, unspecified: Secondary | ICD-10-CM

## 2018-08-18 DIAGNOSIS — R600 Localized edema: Secondary | ICD-10-CM | POA: Insufficient documentation

## 2018-08-18 HISTORY — DX: Localized edema: R60.0

## 2018-08-18 MED ORDER — TETANUS-DIPHTH-ACELL PERTUSSIS 5-2.5-18.5 LF-MCG/0.5 IM SUSP: 0.5000 mL | Freq: Once | INTRAMUSCULAR | 0 refills | Status: AC

## 2018-08-18 NOTE — Assessment & Plan Note (Signed)
  Trace edema bilaterally, worse at end of day. Likely dependent edema. She is on norvasc. Advised her to get low pressure compression socks as the 20-30 she has at home hurt and are too hard to get on, the swelling is not severe enough to need that level of compression. Follow up 3 months

## 2018-08-18 NOTE — Progress Notes (Signed)
    Subjective:    Patient ID: Meredith Owens, female    DOB: 24-Mar-1941, 78 y.o.   MRN: [id number]   CC: follow up BP  HPI: patient has previously had some higher BP readings in the clinic that she wanted to keep an eye on. She has her home readings with her. She checks her BP daily and writes it down. Her systolic readings range 031-594'V. One reading 148. She denies headaches, vision changes, chest pain, SOB. She has some lower extremity puffiness that is chronic. She has been told to wear compression socks in the past and has some at home that are 25-30 mmHg but they were hurting and hard to put on.  She is on daily vitamin D supplementation She is due for tetanus shot  Smoking status reviewed- non-smoker Review of Systems- see HPI   Objective:  BP 128/60   Pulse (!) 57   Temp 98.4 F (36.9 C) (Oral)   Ht 5\' 2"  (1.575 m)   Wt 138 lb 6.4 oz (62.8 kg)   SpO2 99%   BMI 25.31 kg/m  Vitals and nursing note reviewed  General: well nourished, in no acute distress HEENT: normocephalic, MMM Cardiac: RRR, clear S1 and S2, no murmurs, rubs, or gallops Respiratory: clear to auscultation bilaterally, no increased work of breathing Extremities: trace edema bilaterally Neuro: alert and oriented, no focal deficits   Assessment & Plan:    Essential hypertension, benign  Chronic, well controlled. Continue norvasc, losartan, coreg. Follow up 3 months to recheck  Vitamin D insufficiency  Last Vit D reading was 10.7 in August, she is on 2000 units daily. She does not want to get labs today. We will check this at next visit.   Lower leg edema  Trace edema bilaterally, worse at end of day. Likely dependent edema. She is on norvasc. Advised her to get low pressure compression socks as the 20-30 she has at home hurt and are too hard to get on, the swelling is not severe enough to need that level of compression. Follow up 3 months  Rx for tetanus shot given for pt to get at  pharmacy.  Return in about 3 months (around 11/17/2018).   [name], DO Family Medicine Resident PGY-3

## 2018-08-18 NOTE — Assessment & Plan Note (Signed)
  Last Vit D reading was 10.7 in August, she is on 2000 units daily. She does not want to get labs today. We will check this at next visit.

## 2018-08-18 NOTE — Patient Instructions (Signed)
  Great to see you today! Everything looks good. Enjoy your birthday coming up!  Please get tetanus shot done at your convenience  We'll see you back in 3 months to check in. If you have questions or concerns please do not hesitate to call at [phone].  [name], DO PGY-3, [facility] 08/18/2018 1:55 PM

## 2018-08-18 NOTE — Assessment & Plan Note (Signed)
  Chronic, well controlled. Continue norvasc, losartan, coreg. Follow up 3 months to recheck

## 2018-09-12 ENCOUNTER — Ambulatory Visit
Admission: RE | Admit: 2018-09-12 | Discharge: 2018-09-12 | Disposition: A | Discharge status: Home / Self Care | Payer: PPO | Source: Ambulatory Visit | Attending: Family Medicine | Admitting: Family Medicine

## 2018-09-12 DIAGNOSIS — Z1231 Encounter for screening mammogram for malignant neoplasm of breast: Secondary | ICD-10-CM

## 2018-10-16 DIAGNOSIS — H16222 Keratoconjunctivitis sicca, not specified as Sjogren's, left eye: Secondary | ICD-10-CM | POA: Diagnosis not present

## 2018-10-16 DIAGNOSIS — Z970 Presence of artificial eye: Secondary | ICD-10-CM | POA: Diagnosis not present

## 2018-10-16 DIAGNOSIS — Z961 Presence of intraocular lens: Secondary | ICD-10-CM | POA: Diagnosis not present

## 2018-10-16 DIAGNOSIS — H401221 Low-tension glaucoma, left eye, mild stage: Secondary | ICD-10-CM | POA: Diagnosis not present

## 2018-10-16 DIAGNOSIS — H02422 Myogenic ptosis of left eyelid: Secondary | ICD-10-CM | POA: Diagnosis not present

## 2018-10-19 ENCOUNTER — Other Ambulatory Visit: Payer: Self-pay | Admitting: Family Medicine

## 2018-10-20 ENCOUNTER — Other Ambulatory Visit: Payer: Self-pay

## 2018-10-20 ENCOUNTER — Encounter: Payer: Self-pay | Admitting: Family Medicine

## 2018-10-20 ENCOUNTER — Ambulatory Visit (INDEPENDENT_AMBULATORY_CARE_PROVIDER_SITE_OTHER): Payer: PPO | Admitting: Family Medicine

## 2018-10-20 VITALS — BP 142/62 | HR 74 | Temp 98.5°F | Ht 62.0 in | Wt 138.0 lb

## 2018-10-20 DIAGNOSIS — H02402 Unspecified ptosis of left eyelid: Secondary | ICD-10-CM

## 2018-10-20 DIAGNOSIS — I10 Essential (primary) hypertension: Secondary | ICD-10-CM

## 2018-10-20 DIAGNOSIS — M2012 Hallux valgus (acquired), left foot: Secondary | ICD-10-CM | POA: Diagnosis not present

## 2018-10-20 NOTE — Progress Notes (Signed)
    Subjective:    Patient ID: Meredith Owens, female    DOB: 1940/11/16, 78 y.o.   MRN: [id number]   CC: foot issue  HPI: patient has had some irritation of her left 1st and second toes. She thinks she has a hammer toe. Some shoes make this worse. The toe will look oddly bent to her.   Ptosis - her left eye is her good eye but has had issues with eyelid drooping. Has talked to her eye doctor who recommended blepharoplasty. She is considering this.  HTN- no chest pain, SOB, headaches, vision changes  Smoking status reviewed- non-smoker  Review of Systems- see HPI   Objective:  BP (!) 142/62   Pulse 74   Temp 98.5 F (36.9 C) (Oral)   Ht 5\' 2"  (1.575 m)   Wt 138 lb (62.6 kg)   SpO2 97%   BMI 25.24 kg/m  Vitals and nursing note reviewed  General: well nourished, in no acute distress HEENT: normocephalic, MMM Cardiac: regular rate Respiratory: no increased work of breathing Extremities: no edema or cyanosis, 1st and 2nd left toe with exaggerated valgus angling Neuro: alert and oriented, no focal deficits   Assessment & Plan:    Essential hypertension, benign  Chronic, well controlled, at goal <150/90. Follow up 3 months.  Hallux valgus (acquired), left foot  Referral made to podiatry  Ptosis of left eyelid  Eye doctor recommended bleph and has referred her to a doctor in [place] for this. I discussed procedure with her.     Return in about 3 months (around 01/20/2019).   [name], DO Family Medicine Resident PGY-3

## 2018-10-20 NOTE — Assessment & Plan Note (Signed)
Referral made to podiatry.

## 2018-10-20 NOTE — Patient Instructions (Addendum)
    Great to see you today! We'll see you in 3 months I sent in referral to podiatry to talk about your foot. You'll be called to make an appointment.  If you have questions or concerns please do not hesitate to call at [phone].  [name], DO PGY-3, [facility] 10/20/2018 3:34 PM

## 2018-10-20 NOTE — Assessment & Plan Note (Signed)
  Chronic, well controlled, at goal <150/90. Follow up 3 months.

## 2018-10-20 NOTE — Assessment & Plan Note (Signed)
  Eye doctor recommended bleph and has referred her to a doctor in [place] for this. I discussed procedure with her.

## 2018-11-01 ENCOUNTER — Telehealth: Payer: Self-pay | Admitting: *Deleted

## 2018-11-01 NOTE — Telephone Encounter (Signed)
Patient wants to let MD know that she has canceled her podiatry referral for the moment and will call to reschedule after Covid-19. Christen Bame, CMA

## 2018-11-01 NOTE — Telephone Encounter (Signed)
Thanks for letting me know!

## 2018-11-13 ENCOUNTER — Ambulatory Visit: Payer: PPO | Admitting: Podiatrist

## 2018-11-14 ENCOUNTER — Ambulatory Visit: Payer: PPO | Admitting: Sports Medicine

## 2019-01-13 ENCOUNTER — Other Ambulatory Visit: Payer: Self-pay | Admitting: Family Medicine

## 2019-01-13 DIAGNOSIS — E559 Vitamin D deficiency, unspecified: Secondary | ICD-10-CM

## 2019-01-25 ENCOUNTER — Other Ambulatory Visit: Payer: Self-pay

## 2019-01-25 ENCOUNTER — Ambulatory Visit (INDEPENDENT_AMBULATORY_CARE_PROVIDER_SITE_OTHER): Payer: PPO | Admitting: Family Medicine

## 2019-01-25 ENCOUNTER — Encounter: Payer: Self-pay | Admitting: Family Medicine

## 2019-01-25 VITALS — BP 140/54 | HR 61 | Wt 140.4 lb

## 2019-01-25 DIAGNOSIS — I10 Essential (primary) hypertension: Secondary | ICD-10-CM

## 2019-01-25 DIAGNOSIS — E559 Vitamin D deficiency, unspecified: Secondary | ICD-10-CM | POA: Diagnosis not present

## 2019-01-25 NOTE — Patient Instructions (Signed)
  [place]. [place] [address]  [facility] [address], [place], [place] [address]   [facility] check your kidney function and vitamin D level today  It's been a pleasure taking care of you! I'll miss you!! Please take care.  If you have questions or concerns please do not hesitate to call at [phone].

## 2019-01-25 NOTE — Progress Notes (Signed)
    Subjective:    Patient ID: Meredith Owens, female    DOB: 11/14/1940, 78 y.o.   MRN: [id number]   CC: HTN  HPI: doing well overall, has been keeping busy in quarantine with reading and cleaning.   She has been working hard to socially distance herself from family. She had to cancel appt to get her left eyelid ptosis addressed due to quarantine but feels there's no rush.   She is compliant with medications and doesn't need refills today She is taking vitamin D 2000 units daily but is unsure if this is adequate  Taking coreg, norvasc, losartan for BP. She denies chest pain, SOB, LE edema, headaches. No episodes of dizziness or syncope at home.  Smoking status reviewed- non-smoker  Review of Systems- see HPI   Objective:  BP (!) 140/54   Pulse 61   Wt 140 lb 6 oz (63.7 kg)   SpO2 99%   BMI 25.67 kg/m  Vitals and nursing note reviewed  General: well nourished, in no acute distress HEENT: normocephalic, MMM Cardiac: RRR, clear S1 and S2, no murmurs, rubs, or gallops Respiratory: clear to auscultation bilaterally, no increased work of breathing Extremities: no edema or cyanosis. Skin: warm and dry, no rashes noted Neuro: alert and oriented, no focal deficits   Assessment & Plan:    Vitamin D insufficiency  Taking 2000 units vit d daily, will check vitamin D level today  Essential hypertension, benign  Chronic, well controlled, at goal <140/90. Continue current regimen. Check BMP today.    Return in about 3 months (around 04/27/2019).   [name], DO Family Medicine Resident PGY-3

## 2019-01-26 LAB — BASIC METABOLIC PANEL
BUN/Creatinine Ratio: 18 (ref 12–28)
BUN: 20 mg/dL (ref 8–27)
CO2: 25 mmol/L (ref 20–29)
Calcium: 10.6 mg/dL — ABNORMAL HIGH (ref 8.7–10.3)
Chloride: 105 mmol/L (ref 96–106)
Creatinine, Ser: 1.13 mg/dL — ABNORMAL HIGH (ref 0.57–1.00)
GFR calc Af Amer: 54 mL/min/{1.73_m2} — ABNORMAL LOW (ref >59)
GFR calc non Af Amer: 47 mL/min/{1.73_m2} — ABNORMAL LOW (ref >59)
Glucose: 107 mg/dL — ABNORMAL HIGH (ref 65–99)
Potassium: 4.1 mmol/L (ref 3.5–5.2)
Sodium: 145 mmol/L — ABNORMAL HIGH (ref 134–144)

## 2019-01-26 LAB — VITAMIN D 25 HYDROXY (VIT D DEFICIENCY, FRACTURES): Vit D, 25-Hydroxy: 43 ng/mL (ref 30.0–100.0)

## 2019-01-26 NOTE — Progress Notes (Signed)
Please let patient know labs look good other than she is likely slightly dehydrated. Vitamin D has come  Up nicely with the supplementation and she should continue this. Thanks!

## 2019-01-30 ENCOUNTER — Encounter: Payer: Self-pay | Admitting: Family Medicine

## 2019-01-30 NOTE — Assessment & Plan Note (Signed)
  Taking 2000 units vit d daily, will check vitamin D level today

## 2019-01-30 NOTE — Assessment & Plan Note (Signed)
  Chronic, well controlled, at goal <140/90. Continue current regimen. Check BMP today.

## 2019-02-13 DIAGNOSIS — H401222 Low-tension glaucoma, left eye, moderate stage: Secondary | ICD-10-CM | POA: Diagnosis not present

## 2019-02-13 DIAGNOSIS — H02422 Myogenic ptosis of left eyelid: Secondary | ICD-10-CM | POA: Diagnosis not present

## 2019-02-13 DIAGNOSIS — Z970 Presence of artificial eye: Secondary | ICD-10-CM | POA: Diagnosis not present

## 2019-02-13 DIAGNOSIS — Z961 Presence of intraocular lens: Secondary | ICD-10-CM | POA: Diagnosis not present

## 2019-02-13 DIAGNOSIS — H16222 Keratoconjunctivitis sicca, not specified as Sjogren's, left eye: Secondary | ICD-10-CM | POA: Diagnosis not present

## 2019-03-06 ENCOUNTER — Other Ambulatory Visit: Payer: Self-pay | Admitting: Student in an Organized Health Care Education/Training Program

## 2019-03-06 DIAGNOSIS — I10 Essential (primary) hypertension: Secondary | ICD-10-CM

## 2019-03-14 ENCOUNTER — Other Ambulatory Visit: Payer: Self-pay

## 2019-03-14 MED ORDER — LOSARTAN POTASSIUM 100 MG PO TABS: 100.0000 mg | ORAL_TABLET | Freq: Every day | ORAL | 3 refills | Status: DC

## 2019-03-15 IMAGING — CR DG HIP (WITH OR WITHOUT PELVIS) 2-3V*L*
3 series · 3 of 3 positions shown · non-contrast
Comparison: No prior.

CLINICAL DATA: Left hip pain.  No known injury.

EXAM:
DG HIP (WITH OR WITHOUT PELVIS) 2-3V LEFT

[t hip ap left]
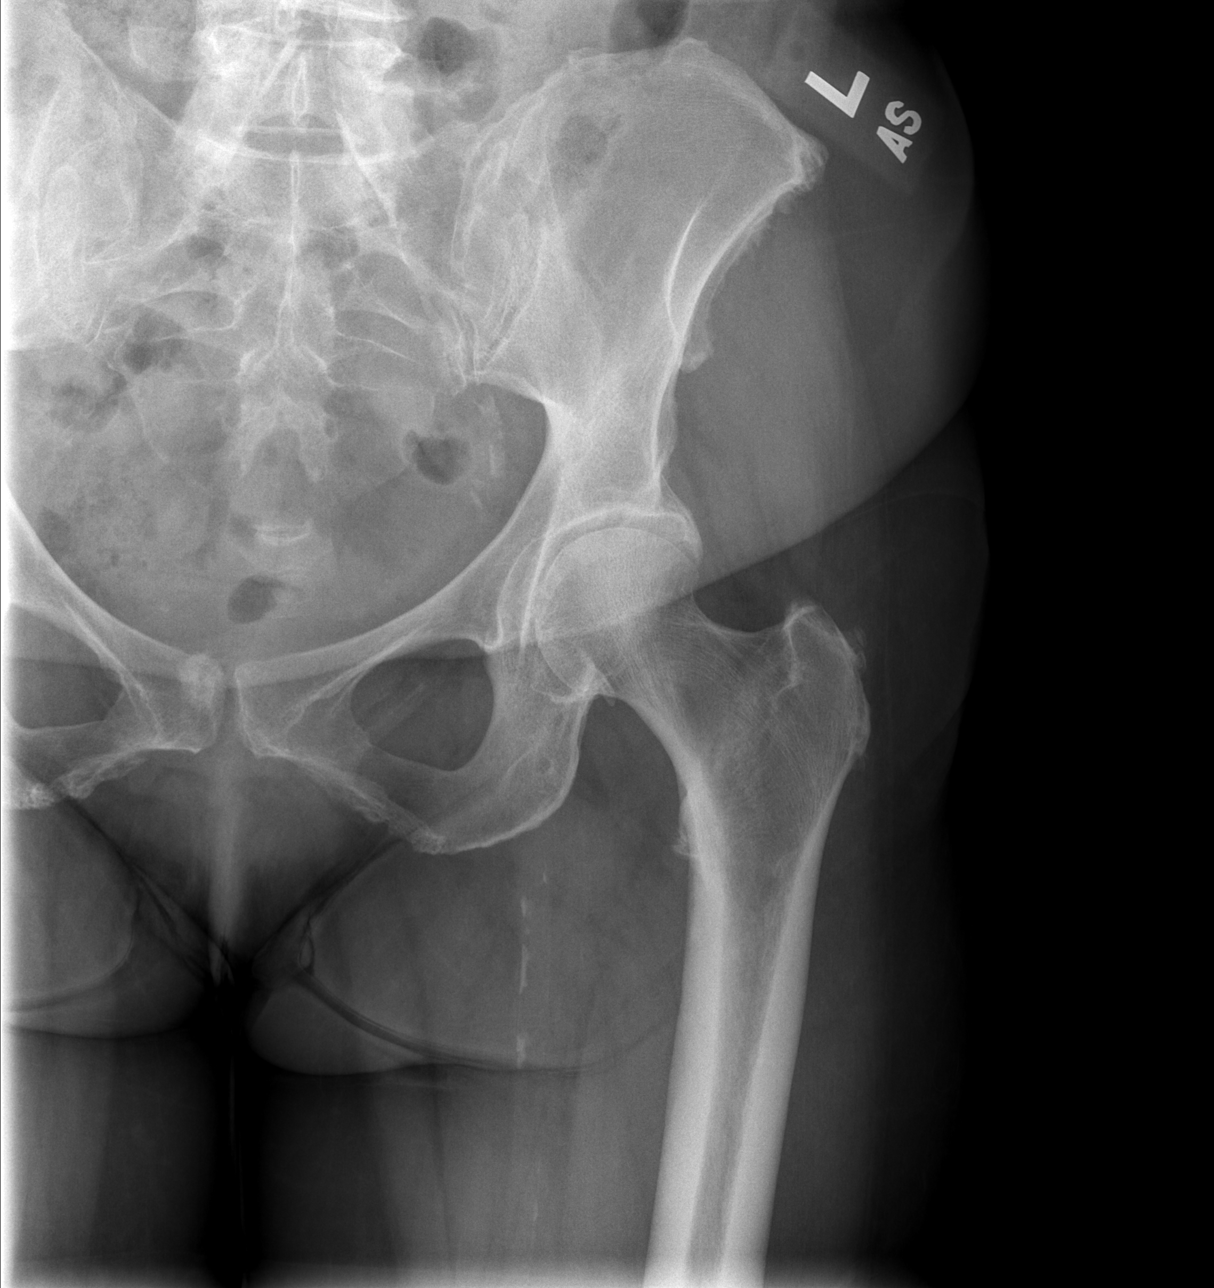

[t hip frog leg left (1 of 2)]
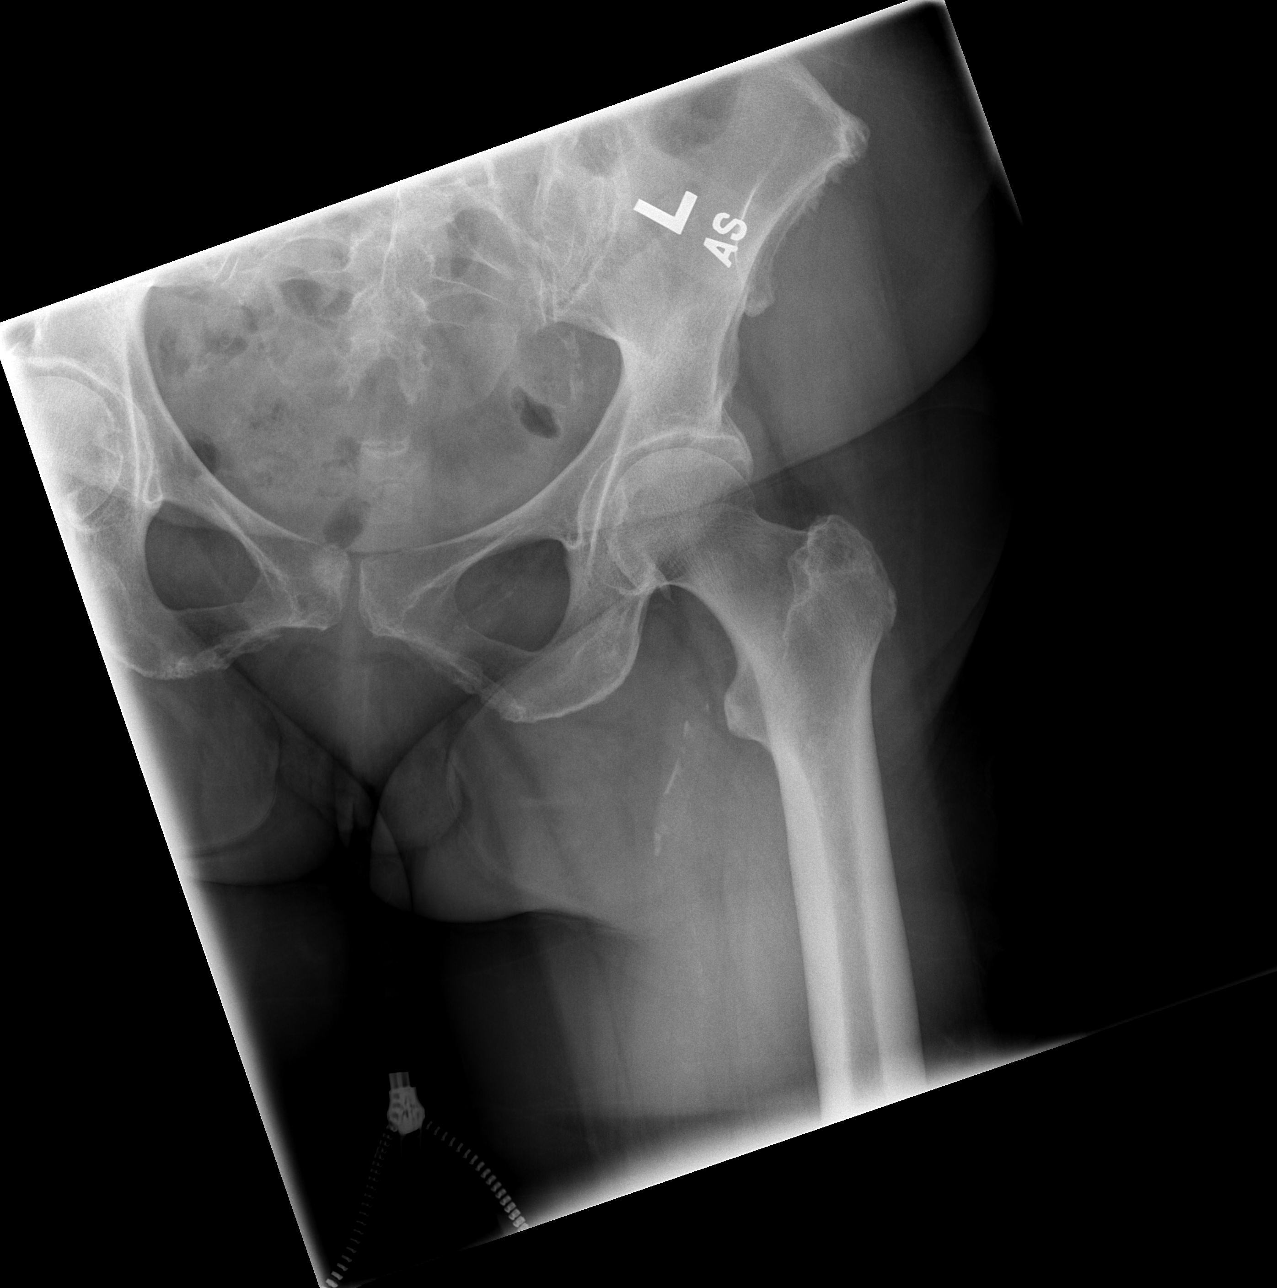

[t hip frog leg left (2 of 2)]
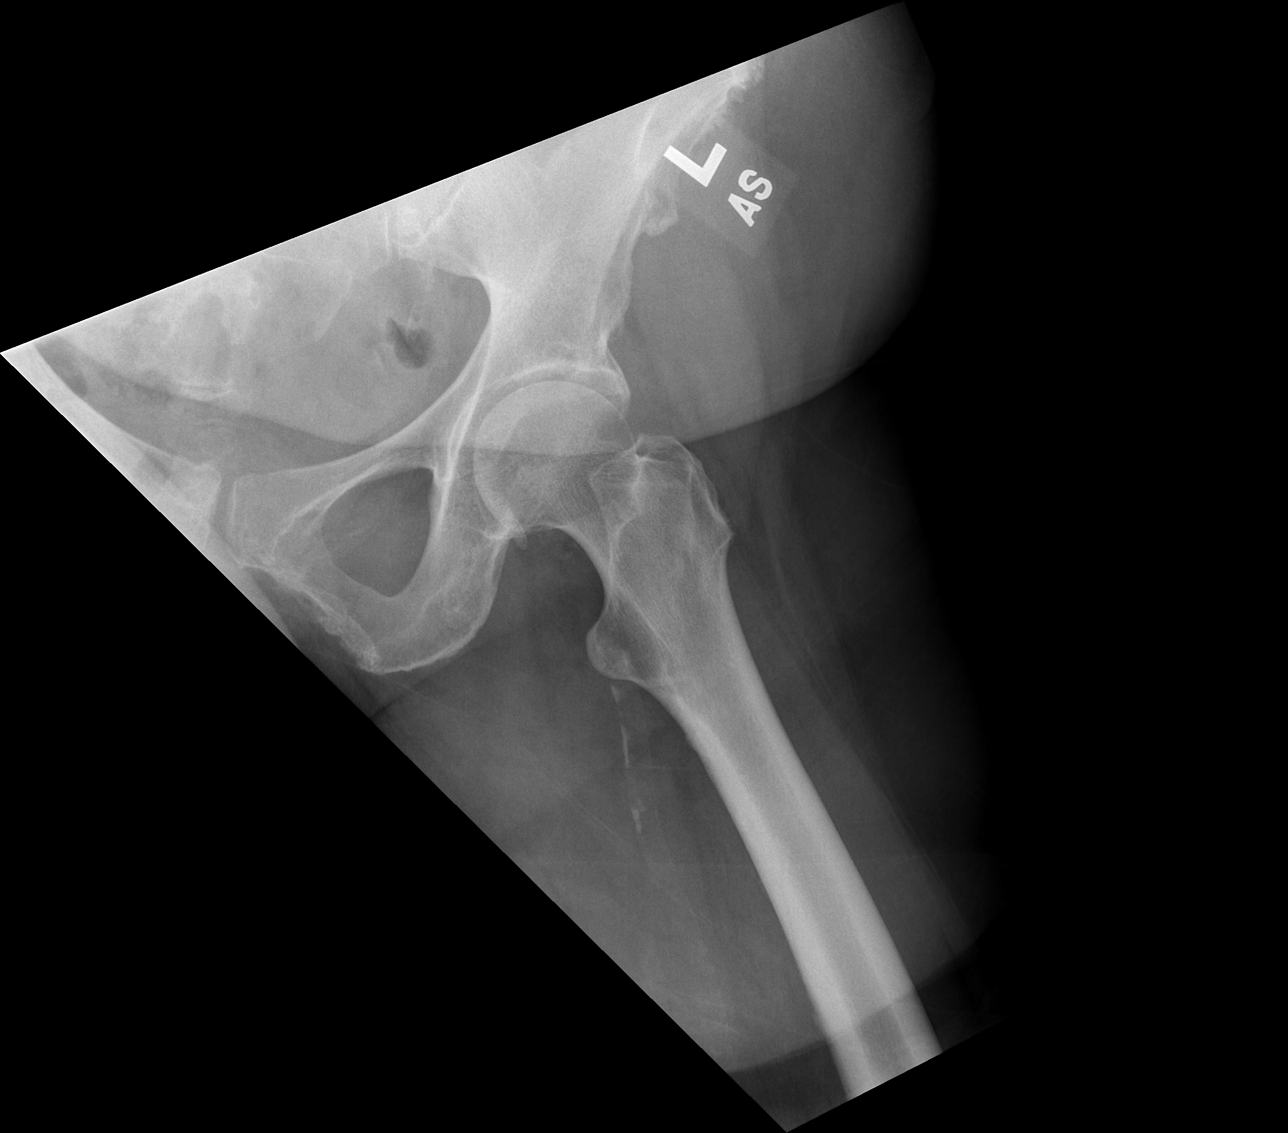

[3 of 3 positions shown; findings below may reference images not displayed]

FINDINGS: Degenerative changes left hip. No acute bony or joint abnormality.
No evidence of fracture dislocation. Aortoiliac atherosclerotic
vascular calcification.
IMPRESSION: 1.  Degenerative changes lumbar spine left hip.

2.  Peripheral vascular disease.

## 2019-04-17 ENCOUNTER — Other Ambulatory Visit: Payer: Self-pay

## 2019-04-17 DIAGNOSIS — H02422 Myogenic ptosis of left eyelid: Secondary | ICD-10-CM | POA: Diagnosis not present

## 2019-04-17 DIAGNOSIS — H16222 Keratoconjunctivitis sicca, not specified as Sjogren's, left eye: Secondary | ICD-10-CM | POA: Diagnosis not present

## 2019-04-17 DIAGNOSIS — H401222 Low-tension glaucoma, left eye, moderate stage: Secondary | ICD-10-CM | POA: Diagnosis not present

## 2019-04-17 DIAGNOSIS — Z961 Presence of intraocular lens: Secondary | ICD-10-CM | POA: Diagnosis not present

## 2019-04-17 DIAGNOSIS — Z970 Presence of artificial eye: Secondary | ICD-10-CM | POA: Diagnosis not present

## 2019-04-17 MED ORDER — ROSUVASTATIN CALCIUM 10 MG PO TABS: 10.0000 mg | ORAL_TABLET | Freq: Every day | ORAL | 3 refills | Status: DC

## 2019-05-01 ENCOUNTER — Other Ambulatory Visit: Payer: Self-pay

## 2019-05-01 ENCOUNTER — Ambulatory Visit (INDEPENDENT_AMBULATORY_CARE_PROVIDER_SITE_OTHER): Payer: PPO | Admitting: Family Medicine

## 2019-05-01 DIAGNOSIS — Z23 Encounter for immunization: Secondary | ICD-10-CM

## 2019-05-01 DIAGNOSIS — I10 Essential (primary) hypertension: Secondary | ICD-10-CM

## 2019-05-01 DIAGNOSIS — M25512 Pain in left shoulder: Secondary | ICD-10-CM

## 2019-05-01 MED ORDER — DICLOFENAC SODIUM 1 % TD GEL: 4.0000 g | Freq: Four times a day (QID) | TRANSDERMAL | 1 refills | Status: AC

## 2019-05-01 NOTE — Progress Notes (Signed)
  Subjective:  Patient ID: Meredith Owens  DOB: 07/31/1941 MRN: [id number]  Meredith Owens is a 78 y.o. female with a PMH of HTN, CKD, stage III, HLD, OA of BL knees, L eye glaucoma, Vit D insufficiency, ptosis of L eye, prediabetes, here today for meet new PCP, flu shot and L arm pain.   HPI:  Left arm pain: - No known trauma started last week. - patient reports she thinks she may have slept wrong as she woke up with her arm hurting and limited mobility of her head due to neck stiffness - she tried a heating pad and her neck mobility improved but she was still having arm pain mid-way down her left arm - she has been taking tylenol for pain which has help but she does not like to take medication - she denies any weakness, numbness or tingling  Hypertension: - Medications: amlodipine 10mg , coreg 12.5mg  BID, losartan 100mg  daily - Compliance: yes - Checking BP at home: no - Denies any SOB, CP, vision changes, LE edema, medication SEs, or symptoms of hypotension  ROS: as mentioned in HPI  Social hx: Denies use of illicit drugs, alcohol use Smoking status reviewed  Patient Active Problem List   Diagnosis Date Noted  . Left shoulder pain 05/03/2019  . Hallux valgus (acquired), left foot 10/20/2018  . Ptosis of left eyelid 10/20/2018  . Vitamin D insufficiency 08/18/2018  . Mild cognitive impairment with memory loss 03/13/2018  . Visual impairment 03/13/2018  . Chronic kidney disease, stage 3 ([place]) 02/17/2015  . Glaucoma, left eye 01/22/2015  . Prediabetes 11/20/2013  . Primary osteoarthritis of both knees 06/21/2011  . Vitreous detachment 11/02/2010  . HYPERCHOLESTEROLEMIA 11/17/2006  . DEPRESSIVE DISORDER, NOS 10/06/2006  . Essential hypertension, benign 10/06/2006     Objective:  BP 128/60   Pulse 66   Wt 138 lb 9.6 oz (62.9 kg)   SpO2 94%   BMI 25.35 kg/m   Vitals and nursing note reviewed  General: NAD, pleasant Cardiac: RRR, normal heart sounds, no m/r/g Pulm: normal  effort, CTAB Extremities: no edema or cyanosis. WWP. No gross abnormality noted. L shoulder with decreased ROM esp with abduction and is unable to bring L shoulder above head; R should with FROM. No evidence of bony deformity, asymmetry, or muscle atrophy; No tenderness over long head of biceps (bicipital groove). No TTP at [facility] joint. Strength 5/5 throughout. No abnormal scapular function observed. Sensation intact. Peripheral pulses intact. Skin: warm and dry, no rashes noted Neuro: alert and oriented, no focal deficits Psych: normal affect, normal thought content  Assessment & Plan:   Left shoulder pain No known trauma. May have component of OA given patient's hx but with acute onset and physical findings, likely due to muscle spasm from neck. Patient to trial heating pads, stretches and voltaren gel. She will return if sx not improved in 2 weeks for possible imaging vs injection. No red flags noted. Strict return precautions discussed.   Essential hypertension, benign BP 128/60 and up to date on BMP. Chronic and well controlled. Continue current regimen.   Health maintenance: Given flu shot today  Martinique Jayliani Wanner, DO Family Medicine Resident PGY-3

## 2019-05-01 NOTE — Patient Instructions (Signed)
Thank you for coming to see me today. It was a pleasure! Today we talked about:   Your arm pain.  For your left arm pain it is likely related to your shoulder and the tight muscles in your neck.  I recommend that you use a heating pad and take warm baths in order to help with the tension.  I also recommend that you try to move your left arm around as much as you are able by doing certain stretches like trying to walk your arm up a wall.  I have also given you Voltaren gel that you can rub on the area for the pain.  Try to avoid sleeping on your shoulder during the night.  Please follow-up with me in 2-4 weeks if your shoulder is not improved, otherwise in 3 months, or sooner as needed.  If you have any questions or concerns, please do not hesitate to call the office at [phone].  Take Care,   Meredith Rhodes Calvert, DO  Shoulder Exercises Ask your health care provider which exercises are safe for you. Do exercises exactly as told by your health care provider and adjust them as directed. It is normal to feel mild stretching, pulling, tightness, or discomfort as you do these exercises. Stop right away if you feel sudden pain or your pain gets worse. Do not begin these exercises until told by your health care provider. Stretching exercises External rotation and abduction This exercise is sometimes called corner stretch. This exercise rotates your arm outward (external rotation) and moves your arm out from your body (abduction). 1. Stand in a doorway with one of your feet slightly in front of the other. This is called a staggered stance. If you cannot reach your forearms to the door frame, stand facing a corner of a room. 2. Choose one of the following positions as told by your health care provider: ? Place your hands and forearms on the door frame above your head. ? Place your hands and forearms on the door frame at the height of your head. ? Place your hands on the door frame at the height of your  elbows. 3. Slowly move your weight onto your front foot until you feel a stretch across your chest and in the front of your shoulders. Keep your head and chest upright and keep your abdominal muscles tight.  Extension, standing 1. Stand and hold a broomstick, a cane, or a similar object behind your back. ? Your hands should be a little wider than shoulder width apart. ? Your palms should face away from your back. 2. Keeping your elbows straight and your shoulder muscles relaxed, move the stick away from your body until you feel a stretch in your shoulders (extension). ? Avoid shrugging your shoulders while you move the stick. Keep your shoulder blades tucked down toward the middle of your back.  Range-of-motion exercises Pendulum  1. Stand near a wall or a surface that you can hold onto for balance. 2. Bend at the waist and let your left / right arm hang straight down. Use your other arm to support you. Keep your back straight and do not lock your knees. 3. Relax your left / right arm and shoulder muscles, and move your hips and your trunk so your left / right arm swings freely. Your arm should swing because of the motion of your body, not because you are using your arm or shoulder muscles. 4. Keep moving your hips and trunk so your arm swings  in the following directions, as told by your health care provider: ? Side to side. ? Forward and backward. ? In clockwise and counterclockwise circles.  Shoulder flexion, standing  1. Stand and hold a broomstick, a cane, or a similar object. Place your hands a little more than shoulder width apart on the object. Your left / right hand should be palm up, and your other hand should be palm down. 2. Keep your elbow straight and your shoulder muscles relaxed. Push the stick up with your healthy arm to raise your left / right arm in front of your body, and then over your head until you feel a stretch in your shoulder (flexion). ? Avoid shrugging your  shoulder while you raise your arm. Keep your shoulder blade tucked down toward the middle of your back.  Shoulder abduction, standing 1. Stand and hold a broomstick, a cane, or a similar object. Place your hands a little more than shoulder width apart on the object. Your left / right hand should be palm up, and your other hand should be palm down. 2. Keep your elbow straight and your shoulder muscles relaxed. Push the object across your body toward your left / right side. Raise your left / right arm to the side of your body (abduction) until you feel a stretch in your shoulder. ? Do not raise your arm above shoulder height unless your health care provider tells you to do that. ? If directed, raise your arm over your head. ? Avoid shrugging your shoulder while you raise your arm. Keep your shoulder blade tucked down toward the middle of your back.  Internal rotation  1. Place your left / right hand behind your back, palm up. 2. Use your other hand to dangle an exercise band, a towel, or a similar object over your shoulder. Grasp the band with your left / right hand so you are holding on to both ends. 3. Gently pull up on the band until you feel a stretch in the front of your left / right shoulder. The movement of your arm toward the center of your body is called internal rotation. ? Avoid shrugging your shoulder while you raise your arm. Keep your shoulder blade tucked down toward the middle of your back.  Strengthening exercises External rotation  1. Sit in a stable chair without armrests. 2. Secure an exercise band to a stable object at elbow height on your left / right side. 3. Place a soft object, such as a folded towel or a small pillow, between your left / right upper arm and your body to move your elbow about 4 inches (10 cm) away from your side. 4. Hold the end of the exercise band so it is tight and there is no slack. 5. Keeping your elbow pressed against the soft object, slowly move  your forearm out, away from your abdomen (external rotation). Keep your body steady so only your forearm moves.  Shoulder extension 1. Sit in a stable chair without armrests, or stand up. 2. Secure an exercise band to a stable object in front of you so it is at shoulder height. 3. Hold one end of the exercise band in each hand. Your palms should face each other. 4. Straighten your elbows and lift your hands up to shoulder height. 5. Step back, away from the secured end of the exercise band, until the band is tight and there is no slack. 6. Squeeze your shoulder blades together as you pull your hands down  to the sides of your thighs (extension). Stop when your hands are straight down by your sides. Do not let your hands go behind your body.  Shoulder row 1. Sit in a stable chair without armrests, or stand up. 2. Secure an exercise band to a stable object in front of you so it is at waist height. 3. Hold one end of the exercise band in each hand. Position your palms so that your thumbs are facing the ceiling (neutral position). 4. Bend each of your elbows to a 90-degree angle (right angle) and keep your upper arms at your sides. 5. Step back until the band is tight and there is no slack. 6. Slowly pull your elbows back behind you.  Shoulder press-ups  1. Sit in a stable chair that has armrests. Sit upright, with your feet flat on the floor. 2. Put your hands on the armrests so your elbows are bent and your fingers are pointing forward. Your hands should be about even with the sides of your body. 3. Push down on the armrests and use your arms to lift yourself off the chair. Straighten your elbows and lift yourself up as much as you comfortably can. ? Move your shoulder blades down, and avoid letting your shoulders move up toward your ears. ? Keep your feet on the ground. As you get stronger, your feet should support less of your body weight as you lift yourself up.  Wall push-ups  1. Stand  so you are facing a stable wall. Your feet should be about one arm-length away from the wall. 2. Lean forward and place your palms on the wall at shoulder height. 3. Keep your feet flat on the floor as you bend your elbows and lean forward toward the wall.  This information is not intended to replace advice given to you by your health care provider. Make sure you discuss any questions you have with your health care provider. Document Released: 06/09/2005 Document Revised: 11/17/2018 Document Reviewed: 08/25/2018 Elsevier Patient Education  2020 [employer].

## 2019-05-03 DIAGNOSIS — M25512 Pain in left shoulder: Secondary | ICD-10-CM | POA: Insufficient documentation

## 2019-05-03 DIAGNOSIS — M25519 Pain in unspecified shoulder: Secondary | ICD-10-CM | POA: Insufficient documentation

## 2019-05-03 NOTE — Assessment & Plan Note (Signed)
No known trauma. May have component of OA given patient's hx but with acute onset and physical findings, likely due to muscle spasm from neck. Patient to trial heating pads, stretches and voltaren gel. She will return if sx not improved in 2 weeks for possible imaging vs injection. No red flags noted. Strict return precautions discussed.

## 2019-05-03 NOTE — Assessment & Plan Note (Signed)
BP 128/60 and up to date on BMP. Chronic and well controlled. Continue current regimen.

## 2019-06-21 ENCOUNTER — Telehealth: Payer: Self-pay

## 2019-06-21 NOTE — Telephone Encounter (Signed)
I am happy to refer her to a dermatologist however they have been more difficult to get in with.  She is more than welcome to be seen by me in the office or can be scheduled for her own dermatology clinic if there is time.  We can certainly look at it first to see if there is something we can do to help and then she came be scheduled with them but given the length of time is taking for referrals I do recommend she may be be seen before that.

## 2019-06-21 NOTE — Telephone Encounter (Signed)
Patient calls nurse line stating she has some discoloration of her skin and would like a referral to a dermatologist. Patient stated they are mostly around her thigh and leg area, they do not itch or bother her, just color changes. Please advise.

## 2019-06-22 NOTE — Telephone Encounter (Signed)
Scheduled patient with PCP 12/9.

## 2019-07-18 ENCOUNTER — Encounter: Payer: Self-pay | Admitting: Family Medicine

## 2019-07-18 ENCOUNTER — Ambulatory Visit (INDEPENDENT_AMBULATORY_CARE_PROVIDER_SITE_OTHER): Payer: PPO | Admitting: Family Medicine

## 2019-07-18 ENCOUNTER — Other Ambulatory Visit: Payer: Self-pay

## 2019-07-18 VITALS — BP 130/60 | HR 58 | Ht 62.0 in | Wt 136.6 lb

## 2019-07-18 DIAGNOSIS — E049 Nontoxic goiter, unspecified: Secondary | ICD-10-CM

## 2019-07-18 DIAGNOSIS — I10 Essential (primary) hypertension: Secondary | ICD-10-CM

## 2019-07-18 NOTE — Patient Instructions (Addendum)
Thank you for coming to see me today. It was a pleasure! Today we talked about:   Your rashes do not look concerning. We will look into thyroid labs about your neck area and may get an ultrasound of the neck. I will call you about that. Please schedule a follow up to discuss your memory.   Please follow-up as needed. I will call you about your blood work.  If you have any questions or concerns, please do not hesitate to call the office at [phone].  Take Care,   Martinique Ovadia Lopp, DO

## 2019-07-18 NOTE — Progress Notes (Signed)
  Subjective:  Patient ID: Meredith Owens  DOB: 1941-03-29 MRN: [id number]  Meredith Owens is a 78 y.o. female with a PMH of HTN, CKD, stage III, HLD, OA of BL knees, L eye glaucoma, Vit D insufficiency, ptosis of L eye, prediabetes, here today for skin discoloration.   HPI:  Skin discoloraiton: - Patient is concerned about a few areas that are present on her abdomen, legs and wrists. She has had them for over a few months that she remembers but she has not had any symptoms with the rash. She reports she just noticed it and wanted it to be checked out. - She is concerned about some small red bumps that she has noticed that are not painful, not pruritic, or tender.  - she also has some areas of hypopigmentation that she has noticed over time appearing that are not tender, painful, red or pruritic. - patient denies any new soaps, detergents, she denies fevers, new medications or changes in medications  Throat mass: Patient reports that she is feeling a growth on her throat. She noticed it in the past few weeks, maybe over a month. She reports she has no trouble swallowing, no change in voice. She has never smoked. She has lost some weight unintentionally, but does note that se is eating less during covid because she used to go out a lot. She denies any palpitations or feelings of fatigue, no hot or cold intolerance. She has never had any issues with her thyroid and is not aware of any family hx of thyroid issues.   ROS: As mentioned in HPI  Smoking status reviewed  Patient Active Problem List   Diagnosis Date Noted  . Enlarged thyroid 07/20/2019  . Hallux valgus (acquired), left foot 10/20/2018  . Ptosis of left eyelid 10/20/2018  . Vitamin D insufficiency 08/18/2018  . Mild cognitive impairment with memory loss 03/13/2018  . Visual impairment 03/13/2018  . Chronic kidney disease, stage 3 ([place]) 02/17/2015  . Glaucoma, left eye 01/22/2015  . Prediabetes 11/20/2013  . Primary osteoarthritis of  both knees 06/21/2011  . Vitreous detachment 11/02/2010  . HYPERCHOLESTEROLEMIA 11/17/2006  . DEPRESSIVE DISORDER, NOS 10/06/2006  . Essential hypertension, benign 10/06/2006     Objective:  BP 130/60 (BP Location: Right Arm)   Pulse (!) 58   Ht 5\' 2"  (1.575 m)   Wt 136 lb 9.6 oz (62 kg)   SpO2 98%   BMI 24.98 kg/m   Vitals and nursing note reviewed  General: NAD, pleasant Neck: slightly enlarged thyroid palpated on neck with R>L, supple Pulm: normal effort, CTAB Cardiac: RRR, normal heart sounds, no m/r/g Extremities: no edema or cyanosis. WWP. Skin: warm and dry, 3 hemangiomas present on abdomen and one on wrist and one on leg; scattered hypopigmented areas on leg and abdomen that appear to be areas of previous trauma, no border noted, no erythema or swelling Neuro: alert and oriented, no focal deficits Psych: normal affect, normal thought content  Assessment & Plan:   Enlarged thyroid Patient with enlargement of thyroid on exam. TSH wnl. Free T4 and T3 pending. No red flag findings. Will screen with [place] of thyroid. Patient largely asymptomatic but does have some recent unintentional weight loss.   Rash: patient with non-concerning hemangiomas and reassurred. Also with hypopigmented lesions that appear to be c/w previous trauma and healing. No red flags. Patient reassured and to follow up if they worsen or dont improve.   Meredith Vernetta Dizdarevic, DO Family Medicine Resident PGY-3

## 2019-07-19 LAB — CBC
Hematocrit: 36.7 % (ref 34.0–46.6)
Hemoglobin: 12 g/dL (ref 11.1–15.9)
MCH: 31.8 pg (ref 26.6–33.0)
MCHC: 32.7 g/dL (ref 31.5–35.7)
MCV: 97 fL (ref 79–97)
Platelets: 274 10*3/uL (ref 150–450)
RBC: 3.77 x10E6/uL (ref 3.77–5.28)
RDW: 12.4 % (ref 11.7–15.4)
WBC: 7.2 10*3/uL (ref 3.4–10.8)

## 2019-07-19 LAB — BASIC METABOLIC PANEL
BUN/Creatinine Ratio: 18 (ref 12–28)
BUN: 20 mg/dL (ref 8–27)
CO2: 24 mmol/L (ref 20–29)
Calcium: 10.7 mg/dL — ABNORMAL HIGH (ref 8.7–10.3)
Chloride: 104 mmol/L (ref 96–106)
Creatinine, Ser: 1.14 mg/dL — ABNORMAL HIGH (ref 0.57–1.00)
GFR calc Af Amer: 54 mL/min/{1.73_m2} — ABNORMAL LOW (ref >59)
GFR calc non Af Amer: 46 mL/min/{1.73_m2} — ABNORMAL LOW (ref >59)
Glucose: 88 mg/dL (ref 65–99)
Potassium: 4.5 mmol/L (ref 3.5–5.2)
Sodium: 141 mmol/L (ref 134–144)

## 2019-07-19 LAB — TSH: TSH: 1.16 u[IU]/mL (ref 0.450–4.500)

## 2019-07-20 ENCOUNTER — Telehealth: Payer: Self-pay | Admitting: *Deleted

## 2019-07-20 ENCOUNTER — Other Ambulatory Visit: Payer: Self-pay | Admitting: *Deleted

## 2019-07-20 DIAGNOSIS — E049 Nontoxic goiter, unspecified: Secondary | ICD-10-CM | POA: Insufficient documentation

## 2019-07-20 MED ORDER — CARVEDILOL 12.5 MG PO TABS: 12.5000 mg | ORAL_TABLET | Freq: Two times a day (BID) | ORAL | 2 refills | Status: DC

## 2019-07-20 NOTE — Telephone Encounter (Signed)
-----   Message from Martinique Shirley, DO sent at 07/20/2019 10:30 AM EST ----- Please call and schedule patient to have [place] of thyroid for a goiter. I called her and let her know we would be scheduling this. She said any time works for her. Thank you

## 2019-07-20 NOTE — Assessment & Plan Note (Signed)
Patient with enlargement of thyroid on exam. TSH wnl. Free T4 and T3 pending. No red flag findings. Will screen with [place] of thyroid. Patient largely asymptomatic but does have some recent unintentional weight loss.

## 2019-07-20 NOTE — Telephone Encounter (Signed)
Patient informed of appt scheduled on 07-31-2019 at 1pm.  [facility]

## 2019-07-21 LAB — T4, FREE: Free T4: 1.29 ng/dL (ref 0.82–1.77)

## 2019-07-21 LAB — T3, FREE: T3, Free: 2.8 pg/mL (ref 2.0–4.4)

## 2019-07-24 ENCOUNTER — Other Ambulatory Visit: Payer: Self-pay | Admitting: *Deleted

## 2019-07-24 DIAGNOSIS — E559 Vitamin D deficiency, unspecified: Secondary | ICD-10-CM

## 2019-07-24 MED ORDER — VITAMIN D3 50 MCG (2000 UT) PO CAPS: 2000.0000 [IU] | ORAL_CAPSULE | Freq: Every day | ORAL | 1 refills | Status: DC

## 2019-07-31 ENCOUNTER — Ambulatory Visit
Admission: RE | Admit: 2019-07-31 | Discharge: 2019-07-31 | Disposition: A | Discharge status: Home / Self Care | Payer: PPO | Source: Ambulatory Visit | Attending: Family Medicine | Admitting: Family Medicine

## 2019-07-31 DIAGNOSIS — E041 Nontoxic single thyroid nodule: Secondary | ICD-10-CM | POA: Diagnosis not present

## 2019-07-31 DIAGNOSIS — E049 Nontoxic goiter, unspecified: Secondary | ICD-10-CM

## 2019-08-07 ENCOUNTER — Telehealth: Payer: Self-pay

## 2019-08-07 ENCOUNTER — Other Ambulatory Visit: Payer: Self-pay | Admitting: Family Medicine

## 2019-08-07 DIAGNOSIS — Z1231 Encounter for screening mammogram for malignant neoplasm of breast: Secondary | ICD-10-CM

## 2019-08-07 NOTE — Telephone Encounter (Signed)
Called patient and informed them of below results:  IMPRESSION: 1. Multiple thyroid nodules. 2. Dominant nodule is located along the right side of the isthmus. Dominant nodule is mixed cystic and solid composition and measures up to 2.2 cm. This dominant nodule meets criteria for 1 year follow-up.    Patient appreciative of call and no questions at this time. Informed her that we would be happy to answer any further questions if they come up  Meredith More, DO, PGY-3 [facility] 08/07/2019 11:47 AM

## 2019-08-07 NOTE — Telephone Encounter (Signed)
Pt calling nurse line requesting results from [place] on 07/31/19.  Talbot Grumbling, RN

## 2019-08-15 DIAGNOSIS — H401222 Low-tension glaucoma, left eye, moderate stage: Secondary | ICD-10-CM | POA: Diagnosis not present

## 2019-08-15 DIAGNOSIS — Z961 Presence of intraocular lens: Secondary | ICD-10-CM | POA: Diagnosis not present

## 2019-08-15 DIAGNOSIS — H16222 Keratoconjunctivitis sicca, not specified as Sjogren's, left eye: Secondary | ICD-10-CM | POA: Diagnosis not present

## 2019-08-15 DIAGNOSIS — H02422 Myogenic ptosis of left eyelid: Secondary | ICD-10-CM | POA: Diagnosis not present

## 2019-08-15 DIAGNOSIS — Z970 Presence of artificial eye: Secondary | ICD-10-CM | POA: Diagnosis not present

## 2019-08-15 DIAGNOSIS — H43812 Vitreous degeneration, left eye: Secondary | ICD-10-CM | POA: Diagnosis not present

## 2019-08-22 ENCOUNTER — Ambulatory Visit: Payer: Medicare Other | Attending: Internal Medicine

## 2019-08-22 DIAGNOSIS — Z23 Encounter for immunization: Secondary | ICD-10-CM | POA: Insufficient documentation

## 2019-08-22 NOTE — Progress Notes (Signed)
   Covid-19 Vaccination Clinic  Name:  Mardell Schuldt    MRN: [id number] DOB: 1940/12/10  08/22/2019  Ms. Manger was observed post Covid-19 immunization for 15 minutes without incidence. She was provided with Vaccine Information Sheet and instruction to access the V-Safe system.   Ms. Gehle was instructed to call 911 with any severe reactions post vaccine: Marland Kitchen Difficulty breathing  . Swelling of your face and throat  . A fast heartbeat  . A bad rash all over your body  . Dizziness and weakness    Immunizations Administered    Name Date Dose VIS Date Route   Pfizer COVID-19 Vaccine 08/22/2019 11:07 AM 0.3 mL 07/20/2019 Intramuscular   Manufacturer: [place]   Lot: [id number]   [place]: [id number]

## 2019-09-12 ENCOUNTER — Ambulatory Visit: Payer: PPO | Attending: Internal Medicine

## 2019-09-12 DIAGNOSIS — Z23 Encounter for immunization: Secondary | ICD-10-CM

## 2019-09-12 NOTE — Progress Notes (Signed)
   Covid-19 Vaccination Clinic  Name:  Meredith Owens    MRN: [id number] DOB: 08-24-1940  09/12/2019  [name] was observed post Covid-19 immunization for 15 minutes without incidence. She was provided with Vaccine Information Sheet and instruction to access the V-Safe system.   [name] was instructed to call 911 with any severe reactions post vaccine: Marland Kitchen Difficulty breathing  . Swelling of your face and throat  . A fast heartbeat  . A bad rash all over your body  . Dizziness and weakness    Immunizations Administered    Name Date Dose VIS Date Route   Pfizer COVID-19 Vaccine 09/12/2019  8:07 AM 0.3 mL 07/20/2019 Intramuscular   Manufacturer: [place]   Lot: [id number]   [place]: [id number]

## 2019-09-19 ENCOUNTER — Ambulatory Visit
Admission: RE | Admit: 2019-09-19 | Discharge: 2019-09-19 | Disposition: A | Discharge status: Home / Self Care | Payer: PPO | Source: Ambulatory Visit | Attending: Family Medicine | Admitting: Family Medicine

## 2019-09-19 ENCOUNTER — Other Ambulatory Visit: Payer: Self-pay

## 2019-09-19 DIAGNOSIS — Z1231 Encounter for screening mammogram for malignant neoplasm of breast: Secondary | ICD-10-CM | POA: Diagnosis not present

## 2019-10-29 ENCOUNTER — Ambulatory Visit: Payer: PPO | Admitting: Family Medicine

## 2019-12-07 ENCOUNTER — Other Ambulatory Visit: Payer: Self-pay | Admitting: Family Medicine

## 2019-12-07 ENCOUNTER — Telehealth: Payer: Self-pay

## 2019-12-07 MED ORDER — ROSUVASTATIN CALCIUM 10 MG PO TABS: 10.0000 mg | ORAL_TABLET | ORAL | 3 refills | Status: DC

## 2019-12-07 NOTE — Telephone Encounter (Signed)
Appears previous provider had her taking every other day due to leg cramps. Will adjust patient's prescription to reflect that.

## 2019-12-07 NOTE — Telephone Encounter (Signed)
Patients care coordinator with [facility], calls nurse line stating there is some confusion in how patient is to be taking her statin medication. Per prescription, patient is to be taking rosuvastatin daily, however the patient reports she is only taking every other day. Care Coordinator is reaching out to provider to see if the prescription needs to be changed to reflect every other day, or if the patient needs to be reminded to take rosuvastatin daily. Please advise.

## 2019-12-17 DIAGNOSIS — H401222 Low-tension glaucoma, left eye, moderate stage: Secondary | ICD-10-CM | POA: Diagnosis not present

## 2019-12-17 DIAGNOSIS — H43812 Vitreous degeneration, left eye: Secondary | ICD-10-CM | POA: Diagnosis not present

## 2019-12-17 DIAGNOSIS — H02422 Myogenic ptosis of left eyelid: Secondary | ICD-10-CM | POA: Diagnosis not present

## 2020-01-11 ENCOUNTER — Ambulatory Visit (INDEPENDENT_AMBULATORY_CARE_PROVIDER_SITE_OTHER): Payer: PPO | Admitting: Family Medicine

## 2020-01-11 ENCOUNTER — Encounter: Payer: Self-pay | Admitting: Family Medicine

## 2020-01-11 ENCOUNTER — Other Ambulatory Visit: Payer: Self-pay

## 2020-01-11 VITALS — BP 118/64 | HR 70 | Ht 62.0 in | Wt 131.0 lb

## 2020-01-11 DIAGNOSIS — M8580 Other specified disorders of bone density and structure, unspecified site: Secondary | ICD-10-CM

## 2020-01-11 DIAGNOSIS — M25512 Pain in left shoulder: Secondary | ICD-10-CM

## 2020-01-11 DIAGNOSIS — M170 Bilateral primary osteoarthritis of knee: Secondary | ICD-10-CM | POA: Diagnosis not present

## 2020-01-11 DIAGNOSIS — M25562 Pain in left knee: Secondary | ICD-10-CM | POA: Diagnosis not present

## 2020-01-11 DIAGNOSIS — L989 Disorder of the skin and subcutaneous tissue, unspecified: Secondary | ICD-10-CM

## 2020-01-11 DIAGNOSIS — I10 Essential (primary) hypertension: Secondary | ICD-10-CM | POA: Diagnosis not present

## 2020-01-11 DIAGNOSIS — G8929 Other chronic pain: Secondary | ICD-10-CM | POA: Diagnosis not present

## 2020-01-11 MED ORDER — LOSARTAN POTASSIUM 100 MG PO TABS: 100.0000 mg | ORAL_TABLET | Freq: Every day | ORAL | 3 refills | Status: DC

## 2020-01-11 MED ORDER — METHYLPREDNISOLONE ACETATE 40 MG/ML IJ SUSP
80.0000 mg | Freq: Once | INTRAMUSCULAR | Status: AC
  Administered 2020-01-11: 80 mg via INTRA_ARTICULAR

## 2020-01-11 MED ORDER — CALCIUM CARBONATE-VITAMIN D 600-400 MG-UNIT PO TABS: 1.0000 | ORAL_TABLET | Freq: Every day | ORAL | 1 refills | Status: DC

## 2020-01-11 MED ORDER — CALCIUM CARBONATE-VITAMIN D 600-400 MG-UNIT PO TABS: 2.0000 | ORAL_TABLET | Freq: Every day | ORAL | 1 refills | Status: DC

## 2020-01-11 NOTE — Progress Notes (Signed)
SUBJECTIVE:   CHIEF COMPLAINT / HPI:   Skin issues: Patient with multiple skin complaints.  She reports that she has skin tags and spots that are hypopigmented as well as some red spots and some hyperpigmented spots that are on her back.  She states that she has previously been told that they are nothing to worry about but she wants to be sure.  She denies any changes to the areas.  She denies any trauma to the areas.  She denies any easy bruising.  Left shoulder pain: Patient reports that she has left shoulder pain when laying on it.  She states it has woken her up occasionally from sleep but bothers her mostly when she is lifting it.  She states that she can work through it.  She denies any trauma.  She states that she does feel stiff in the morning and will rub it and it will get better.  She has been using Voltaren gel which helps as well as Tylenol occasionally.  She denies any weakness, numbness, tingling.  Left knee pain: Patient reports that she has chronic left knee pain that will start in her knee and go to her ankle.  She reports she has previously received injections to the area which has helped with her pain.  She does not recall having any imaging done to the area or physical therapy.  She states she would be willing to do physical therapy.  She states the pain goes all the way to her ankle.  She denies any change to her gait, numbness, tingling, weakness.  Ankle swelling: Patient reports that she has been told to wear compression stockings for ankle swelling.  She states that it swells on and off and feels tight sometimes.  She also states that sometimes she sees her vein.  She is not exactly worried about it but she does not like wearing the compression stockings as it causes her pain.  He denies any shortness of breath, chest pain.  Temporal headache on the right: Patient reports that yesterday she had shooting pain on and off in her right temple.  She states that it would last  about 1 second.  She reports she has a prosthetic eye on that side so she has not had any trouble with vision.  She also reports that she has glaucoma in her other eye.  She does not not report having any trouble chewing or any pain in her jaw.  She states that her teeth are not in the best shape but she has not noticed any worsening symptoms.  She has not tried anything for the pain.  She denies any trauma to the area.  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 No obvious Baker's cystsPalpation normal with no warmth or joint line tenderness or patellar tenderness or condyle tenderness.  No TTP along infrapatellar or pes anserine bursas.   ROM normal in flexion and extension and lower leg rotation. Ligaments with solid consistent  endpoints including ACL, PCL, LCL, MCL.  Negative Anterior Drawer.Non painful patellar compression. Patellar and quadriceps tendons unremarkable.Hamstring and quadriceps strength is normal. Neurovascularly intact B/L LE  Shoulder, L: Inspection reveals no abnormalities, atrophy or asymmetry. Palpation normal with no tenderness over AC joint or bicipital groove. ROM is limited in abduction Rotator cuff strength normal throughout. No signs of impingement. No painful arc and no drop arm sign ASSESSMENT/PLAN:   Skin abnormalities Patient with multiple hyperpigmented areas on back that are stable in nonconcerning.  Patient with multiple telangiectasias on wrist and reassured.  Patient also with a  few hypopigmented areas throughout body that appear to be consistent with previous trauma and subsequent scarring.  Primary osteoarthritis of both knees L>R.  Has previously had steroid injections.  Would like a steroid injection again today.  States the Voltaren gel helps, patient to continue.  Will obtain DG of knees and patient would likely benefit from physical therapy or sports medicine referral.  PROCEDURE After informed written consent timeout was performed, patient was seated in chair in exam room. Left knee was prepped with alcohol swab and utilizing anterolateral approach, patient's left knee was injected intraarticularly with 4:1 bupivicaine: depomedrol.  Patient tolerated the procedure well without immediate complications.  Osteopenia Osteopenia based on DEXA scan 09/2017 with T score of -1.3 of neck of femur. -Repeat DEXA scan -Patient encouraged to take calcium and vitamin D and provided with supplementation -Counseled on need for resistance exercises  Shoulder pain No history of trauma and likely OA given history and physical exam.  Will obtain DG of shoulder.  Patient provided with injection today for acute pain control.  Patient to continue with heating pad, stretches and Voltaren gel as this has helped.  No red flags on exam.  Strict return cautions discussed.  INJECTION:  Patient was given informed consent, signed copy in the chart. Appropriate time out was taken. Area prepped and draped in usual sterile fashion. One cc of Depo-Medrol 40 mg/ml plus one cc of 1% lidocaine was injected into the left shoulder joint. The patient tolerated the procedure well. There were no complications. Post procedure instructions were given.  Essential hypertension, benign BP at goal today at 118/64.  Patient reporting some intermittent ankle swelling.  Patient is currently on amlodipine.  We will trial off of amlodipine.  Patient to follow-up in 2 weeks for repeat blood pressure check.     Temporal headache Given age and history some slight concern for temporal arteritis.  Obtained CRP and ESR to rule out and both were normal.  Patient also with prosthetic eye on that side so no concern for patient losing sight.  Patient instructed to return if headache continues and is likely a tension headache.  Follow-up in 2 weeks for blood pressure check  [place] Rigoberto Repass, DO PGY-3, [facility]

## 2020-01-11 NOTE — Patient Instructions (Addendum)
Thank you for coming to see me today. It was a pleasure! Today we talked about:   The areas on your skin do not appear worrisome.  I have given you injections in your shoulder and knee today.  It should begin to feel better but may hurt worse tomorrow.  Please go have a DEXA scan done at the [facility] center.  I have also put in an order for you to have an x-ray of your shoulder and your knees in order to determine if they have arthritis.  We will call you once these results are available.  Please stop taking your amlodipine to see if this is what causing your leg swelling.  Please begin to check your blood pressure at home regularly.  If it is > 140/90 then please call or after-hours line or come into the office.  Otherwise follow-up with me in about 2 weeks for blood pressure check.  If you have any questions or concerns, please do not hesitate to call the office at [phone].  Take Care,   [place] Yadir Zentner, DO

## 2020-01-12 LAB — CBC
Hematocrit: 36.3 % (ref 34.0–46.6)
Hemoglobin: 11.9 g/dL (ref 11.1–15.9)
MCH: 31.5 pg (ref 26.6–33.0)
MCHC: 32.8 g/dL (ref 31.5–35.7)
MCV: 96 fL (ref 79–97)
Platelets: 275 10*3/uL (ref 150–450)
RBC: 3.78 x10E6/uL (ref 3.77–5.28)
RDW: 13 % (ref 11.7–15.4)
WBC: 8.2 10*3/uL (ref 3.4–10.8)

## 2020-01-12 LAB — BASIC METABOLIC PANEL
BUN/Creatinine Ratio: 18 (ref 12–28)
BUN: 23 mg/dL (ref 8–27)
CO2: 25 mmol/L (ref 20–29)
Calcium: 10.4 mg/dL — ABNORMAL HIGH (ref 8.7–10.3)
Chloride: 104 mmol/L (ref 96–106)
Creatinine, Ser: 1.25 mg/dL — ABNORMAL HIGH (ref 0.57–1.00)
GFR calc Af Amer: 48 mL/min/{1.73_m2} — ABNORMAL LOW (ref >59)
GFR calc non Af Amer: 41 mL/min/{1.73_m2} — ABNORMAL LOW (ref >59)
Glucose: 94 mg/dL (ref 65–99)
Potassium: 4.6 mmol/L (ref 3.5–5.2)
Sodium: 141 mmol/L (ref 134–144)

## 2020-01-12 LAB — C-REACTIVE PROTEIN: CRP: <1 mg/L (ref 0–10)

## 2020-01-12 LAB — SEDIMENTATION RATE: Sed Rate: 15 mm/hr (ref 0–40)

## 2020-01-12 LAB — HEMOGLOBIN A1C
Est. average glucose Bld gHb Est-mCnc: 114 mg/dL
Hgb A1c MFr Bld: 5.6 % (ref 4.8–5.6)

## 2020-01-16 ENCOUNTER — Other Ambulatory Visit: Payer: Self-pay

## 2020-01-16 ENCOUNTER — Ambulatory Visit
Admission: RE | Admit: 2020-01-16 | Discharge: 2020-01-16 | Disposition: A | Discharge status: Home / Self Care | Payer: PPO | Source: Ambulatory Visit | Attending: Family Medicine | Admitting: Family Medicine

## 2020-01-16 ENCOUNTER — Ambulatory Visit: Payer: Self-pay | Admitting: Licensed Clinical Social Worker

## 2020-01-16 ENCOUNTER — Other Ambulatory Visit: Payer: Self-pay | Admitting: Family Medicine

## 2020-01-16 DIAGNOSIS — Z780 Asymptomatic menopausal state: Secondary | ICD-10-CM | POA: Diagnosis not present

## 2020-01-16 DIAGNOSIS — M8589 Other specified disorders of bone density and structure, multiple sites: Secondary | ICD-10-CM | POA: Diagnosis not present

## 2020-01-16 DIAGNOSIS — M8580 Other specified disorders of bone density and structure, unspecified site: Secondary | ICD-10-CM

## 2020-01-16 DIAGNOSIS — E559 Vitamin D deficiency, unspecified: Secondary | ICD-10-CM

## 2020-01-16 DIAGNOSIS — Z7189 Other specified counseling: Secondary | ICD-10-CM

## 2020-01-16 NOTE — Assessment & Plan Note (Signed)
Patient with multiple hyperpigmented areas on back that are stable in nonconcerning.  Patient with multiple telangiectasias on wrist and reassured.  Patient also with a few hypopigmented areas throughout body that appear to be consistent with previous trauma and subsequent scarring.

## 2020-01-16 NOTE — Assessment & Plan Note (Addendum)
L>R.  Has previously had steroid injections.  Would like a steroid injection again today.  States the Voltaren gel helps, patient to continue.  Will obtain DG of knees and patient would likely benefit from physical therapy or sports medicine referral.  PROCEDURE After informed written consent timeout was performed, patient was seated in chair in exam room. Left knee was prepped with alcohol swab and utilizing anterolateral approach, patient's left knee was injected intraarticularly with 4:1 bupivicaine: depomedrol.  Patient tolerated the procedure well without immediate complications.

## 2020-01-16 NOTE — Chronic Care Management (AMB) (Signed)
Care Management   Clinical Social Work Follow Up   01/16/2020 Name: Meredith Owens MRN: [id number] DOB: 10/31/1940 Referred by: Shirley, [place], DO  Reason for referral : Care Coordination (advance directive education )  Meredith Owens is a 79 y.o. year old female who is a primary care patient of Shirley, [place], DO.  Reason for follow-up: Phone encounter with patient today to provide advance directive education.  Assessment: Patient reports she still has advance directive packet provided during [facility].  She is now ready to move forward with completing.  Recommendation: After completing assessment. LCSW determined that patient may benefit from, and is in agreement to contact [facility] for additional legal consultation.  Intervention:Provided patient with information about advance directive and Airline pilot aid a well as Solution-Focused Strategies  Plan:  1. Patient will call Senior Legal aid [phone] 2. LCSW will F/U in 2 weeks  Review of patient status, including review of consultants reports, relevant laboratory and other test results, and collaboration with appropriate care team members and the patient's provider was performed as part of comprehensive patient evaluation and provision of care management services.    Advance Directive Status: N See Care Plan and [facility] application for related entries. SDOH (Social Determinants of Health) assessments performed: No needs identified   Goals Addressed            This Visit's Progress   .  Acknowledge receipt of Advanced Directive package       CARE PLAN ENTRY (see longitudinal plan of care for additional care plan information)  Current Barriers:  . Patient does not have an Forensic scientist . Acknowledges deficits, education and support in order to complete this document . Limited education about a healthcare power of attorney Clinical Social Work Goal(s):  Marland Kitchen Over the next 20 days, the patient will review Advance  Directive with children as evidenced by patient self report of review . Over the next 45 days, the patient will  work with LCSW on completion of Advance Directive, notarize and provide a copy to provider office Interventions provided by LCSW: . A voluntary discussion about advanced care planning including importance of advanced directives, healthcare proxy and living will was discussed with the patient.  . Discussion of Senior Legal Aide and resources they provide.  Patient also interested in [place]. . Advised patient to call Senior Legal aide [phone] Patient Self Care Activities:  . Is able to complete documentation independently . Able to identify next of kin or Meredith Owens . Patient will call Senior Legal aide Initial goal documentation      Outpatient Encounter Medications as of 01/16/2020  Medication Sig  . [facility] D3 50 MCG (2000 UT) CAPS TAKE 1 CAPSULE BY MOUTH EVERY DAY  . acetaminophen (TYLENOL 8 HOUR) 650 MG CR tablet Take 1 tablet (650 mg total) by mouth every 8 (eight) hours as needed for pain.  Marland Kitchen aspirin 81 MG tablet Take 81 mg by mouth daily.  . Calcium Carbonate-Vitamin D 600-400 MG-UNIT tablet Take 2 tablets by mouth daily.  . carvedilol (COREG) 12.5 MG tablet Take 1 tablet (12.5 mg total) by mouth 2 (two) times daily.  . cycloSPORINE (RESTASIS OP) Apply to eye.  . diclofenac sodium (VOLTAREN) 1 % GEL Apply 4 g topically 4 (four) times daily.  . dorzolamide-timolol (COSOPT) 22.3-6.8 MG/ML ophthalmic solution 1 drop 2 (two) times daily.  Marland Kitchen losartan (COZAAR) 100 MG tablet Take 1 tablet (100 mg total) by mouth daily.  Marland Kitchen  ROCKLATAN 0.02-0.005 % SOLN INSTILL 1 DROP INTO LEFT EYE AT BEDTIME  . rosuvastatin (CRESTOR) 10 MG tablet Take 1 tablet (10 mg total) by mouth every other day.  Dema Severin Petrolatum-Mineral Oil (CVS EYE LUBRICANT OP) Apply to eye.   No facility-administered encounter medications on file as of 01/16/2020.   Casimer Lanius,  [place] / [place]   [phone] 3:20 PM

## 2020-01-16 NOTE — Assessment & Plan Note (Signed)
Osteopenia based on DEXA scan 09/2017 with T score of -1.3 of neck of femur. -Repeat DEXA scan -Patient encouraged to take calcium and vitamin D and provided with supplementation -Counseled on need for resistance exercises

## 2020-01-16 NOTE — Assessment & Plan Note (Signed)
BP at goal today at 118/64.  Patient reporting some intermittent ankle swelling.  Patient is currently on amlodipine.  We will trial off of amlodipine.  Patient to follow-up in 2 weeks for repeat blood pressure check.

## 2020-01-16 NOTE — Assessment & Plan Note (Addendum)
No history of trauma and likely OA given history and physical exam.  Will obtain DG of shoulder.  Patient provided with injection today for acute pain control.  Patient to continue with heating pad, stretches and Voltaren gel as this has helped.  No red flags on exam.  Strict return cautions discussed.  INJECTION:  Patient was given informed consent, signed copy in the chart. Appropriate time out was taken. Area prepped and draped in usual sterile fashion. One cc of Depo-Medrol 40 mg/ml plus one cc of 1% lidocaine was injected into the left shoulder joint. The patient tolerated the procedure well. There were no complications. Post procedure instructions were given.

## 2020-01-29 ENCOUNTER — Telehealth: Payer: Self-pay

## 2020-01-29 NOTE — Telephone Encounter (Signed)
Patient calls nurse line requesting lab results from last visit. Please advise.

## 2020-01-29 NOTE — Telephone Encounter (Signed)
I called her on 01/23/2020 and let her know everything was normal as well as discussed the following that is documented with her dexa scan. "Patient called.  Patient aware. She will continue with vitamin D and calcium daily. She will also be going to get her Xrays soon for her knees and left shoulder.    Patient may be interested in physical therapy for her knees and shoulder."  You can let her know it is all normal, but I'm not sure if she has further questions after we talked.

## 2020-01-30 ENCOUNTER — Ambulatory Visit: Payer: PPO | Admitting: Licensed Clinical Social Worker

## 2020-01-30 ENCOUNTER — Other Ambulatory Visit: Payer: Self-pay

## 2020-01-30 DIAGNOSIS — Z7189 Other specified counseling: Secondary | ICD-10-CM

## 2020-01-30 NOTE — Chronic Care Management (AMB) (Signed)
Care Management   Clinical Social Work Follow Up   01/30/2020 Name: Meredith Owens MRN: [id number] DOB: 11-15-1940 Referred by: Meredith Owens, [place], DO  Reason for referral : Care Coordination (advance directives)  Meredith Owens is a 79 y.o. year old female who is a primary care patient of Meredith Owens, [place], DO.  Reason for follow-up: assess for barriers and progress with completing advance directives .   Plan:  1. Patient will discuss with children and call LCSW when she is ready to move forward 2.  No F/U scheduled with LCSW at this time Advance Directive Status: N See Care Plan  SDOH (Social Determinants of Health) assessments performed:  No needs identified   Goals Addressed            This Visit's Progress   .  Acknowledge receipt of Advanced Directive package   Not on track    [place] (see longitudinal plan of care for additional care plan information)  Current Barriers & process:  . Patient does not have an Forensic scientist . Acknowledges deficits, education and support in order to complete this document . Limited education about a healthcare power of attorney . Patient decided not to seek legal counsel Senior Legal aide  Clinical Social Work Goal(s):  Marland Kitchen Over the next 20 days, the patient will review Advance Directive with children as evidenced by patient self report of review . Over the next 45 days, the patient will  work with LCSW on completion of Advance Directive, notarize and provide a copy to provider office Interventions provided by LCSW: . Assessed for barriers with moving forward to complete document . A voluntary discussion about advanced care planning including importance of advanced directives, healthcare proxy and living will was discussed with the patient.  Patient Self Care Activities:  . Is able to complete documentation independently . Able to identify next of kin or Meredith Owens . Patient would like to talk with her  children before completing the document Please see past updates related to this goal by clicking on the "Past Updates" button in the selected goal       Outpatient Encounter Medications as of 01/30/2020  Medication Sig  . acetaminophen (TYLENOL 8 HOUR) 650 MG CR tablet Take 1 tablet (650 mg total) by mouth every 8 (eight) hours as needed for pain.  Marland Kitchen aspirin 81 MG tablet Take 81 mg by mouth daily.  . Calcium Carbonate-Vitamin D 600-400 MG-UNIT tablet Take 2 tablets by mouth daily.  . carvedilol (COREG) 12.5 MG tablet Take 1 tablet (12.5 mg total) by mouth 2 (two) times daily.  . CVS D3 50 MCG (2000 UT) CAPS TAKE 1 CAPSULE BY MOUTH EVERY DAY  . cycloSPORINE (RESTASIS OP) Apply to eye.  . diclofenac sodium (VOLTAREN) 1 % GEL Apply 4 g topically 4 (four) times daily.  . dorzolamide-timolol (COSOPT) 22.3-6.8 MG/ML ophthalmic solution 1 drop 2 (two) times daily.  Marland Kitchen losartan (COZAAR) 100 MG tablet Take 1 tablet (100 mg total) by mouth daily.  Marland Kitchen ROCKLATAN 0.02-0.005 % SOLN INSTILL 1 DROP INTO LEFT EYE AT BEDTIME  . rosuvastatin (CRESTOR) 10 MG tablet Take 1 tablet (10 mg total) by mouth every other day.  Dema Severin Petrolatum-Mineral Oil (CVS EYE LUBRICANT OP) Apply to eye.   No facility-administered encounter medications on file as of 01/30/2020.   Review of patient status, including review of consultants reports, relevant laboratory and other test results, and collaboration with appropriate care team members and the  patient's provider was performed as part of comprehensive patient evaluation and provision of care management services.    Meredith Owens, [place] / [place]   [phone] 12:09 PM

## 2020-02-19 ENCOUNTER — Encounter: Payer: Self-pay | Admitting: Family Medicine

## 2020-02-19 ENCOUNTER — Other Ambulatory Visit: Payer: Self-pay | Admitting: Family Medicine

## 2020-02-19 ENCOUNTER — Other Ambulatory Visit: Payer: Self-pay

## 2020-02-19 ENCOUNTER — Ambulatory Visit (INDEPENDENT_AMBULATORY_CARE_PROVIDER_SITE_OTHER): Payer: PPO | Admitting: Family Medicine

## 2020-02-19 VITALS — BP 192/82 | HR 57 | Ht 62.0 in | Wt 129.8 lb

## 2020-02-19 DIAGNOSIS — M170 Bilateral primary osteoarthritis of knee: Secondary | ICD-10-CM | POA: Diagnosis not present

## 2020-02-19 DIAGNOSIS — L989 Disorder of the skin and subcutaneous tissue, unspecified: Secondary | ICD-10-CM | POA: Diagnosis not present

## 2020-02-19 DIAGNOSIS — I10 Essential (primary) hypertension: Secondary | ICD-10-CM | POA: Diagnosis not present

## 2020-02-19 DIAGNOSIS — M179 Osteoarthritis of knee, unspecified: Secondary | ICD-10-CM | POA: Insufficient documentation

## 2020-02-19 MED ORDER — AMLODIPINE BESYLATE 5 MG PO TABS: 5.0000 mg | ORAL_TABLET | Freq: Every day | ORAL | 3 refills | Status: DC

## 2020-02-19 NOTE — Patient Instructions (Signed)
Thank you for choosing [facility] for your care.   I have ordered xrays for your knees, please go to [facility] to have these images done and we will discuss results at your two week follow up appointment.   I have also sent a referral for physical therapy to help with your knee pain.   Please continue to check your blood pressure at home and let us know if you begin to have persistent headaches, chest pain or shortness of breath.    Osteoarthritis  Osteoarthritis is a type of arthritis that affects tissue that covers the ends of bones in joints (cartilage). Cartilage acts as a cushion between the bones and helps them move smoothly. Osteoarthritis results when cartilage in the joints gets worn down. Osteoarthritis is sometimes called "wear and tear" arthritis. Osteoarthritis is the most common form of arthritis. It often occurs in older people. It is a condition that gets worse over time (a progressive condition). Joints that are most often affected by this condition are in:  Fingers.  Toes.  Hips.  Knees.  Spine, including neck and lower back. What are the causes? This condition is caused by age-related wearing down of cartilage that covers the ends of bones. What increases the risk? The following factors may make you more likely to develop this condition:  Older age.  Being overweight or obese.  Overuse of joints, such as in athletes.  Past injury of a joint.  Past surgery on a joint.  Family history of osteoarthritis. What are the signs or symptoms? The main symptoms of this condition are pain, swelling, and stiffness in the joint. The joint may lose its shape over time. Small pieces of bone or cartilage may break off and float inside of the joint, which may cause more pain and damage to the joint. Small deposits of bone (osteophytes) may grow on the edges of the joint. Other symptoms may include:  A grating or scraping feeling inside the joint when you  move it.  Popping or creaking sounds when you move. Symptoms may affect one or more joints. Osteoarthritis in a major joint, such as your knee or hip, can make it painful to walk or exercise. If you have osteoarthritis in your hands, you might not be able to grip items, twist your hand, or control small movements of your hands and fingers (fine motor skills). How is this diagnosed? This condition may be diagnosed based on:  Your medical history.  A physical exam.  Your symptoms.  X-rays of the affected joint(s).  Blood tests to rule out other types of arthritis. How is this treated? There is no cure for this condition, but treatment can help to control pain and improve joint function. Treatment plans may include:  A prescribed exercise program that allows for rest and joint relief. You may work with a physical therapist.  A weight control plan.  Pain relief techniques, such as: ? Applying heat and cold to the joint. ? Electric pulses delivered to nerve endings under the skin (transcutaneous electrical nerve stimulation, or TENS). ? Massage. ? Certain nutritional supplements.  NSAIDs or prescription medicines to help relieve pain.  Medicine to help relieve pain and inflammation (corticosteroids). This can be given by mouth (orally) or as an injection.  Assistive devices, such as a brace, wrap, splint, specialized glove, or cane.  Surgery, such as: ? An osteotomy. This is done to reposition the bones and relieve pain or to remove loose pieces of bone and  cartilage. ? Joint replacement surgery. You may need this surgery if you have very bad (advanced) osteoarthritis. Follow these instructions at home: Activity  Rest your affected joints as directed by your health care provider.  Do not drive or use heavy machinery while taking prescription pain medicine.  Exercise as directed. Your health care provider or physical therapist may recommend specific types of exercise, such  as: ? Strengthening exercises. These are done to strengthen the muscles that support joints that are affected by arthritis. They can be performed with weights or with exercise bands to add resistance. ? Aerobic activities. These are exercises, such as brisk walking or water aerobics, that get your heart pumping. ? Range-of-motion activities. These keep your joints easy to move. ? Balance and agility exercises. Managing pain, stiffness, and swelling      If directed, apply heat to the affected area as often as told by your health care provider. Use the heat source that your health care provider recommends, such as a moist heat pack or a heating pad. ? If you have a removable assistive device, remove it as told by your health care provider. ? Place a towel between your skin and the heat source. If your health care provider tells you to keep the assistive device on while you apply heat, place a towel between the assistive device and the heat source. ? Leave the heat on for 20-30 minutes. ? Remove the heat if your skin turns bright red. This is especially important if you are unable to feel pain, heat, or cold. You may have a greater risk of getting burned.  If directed, put ice on the affected joint: ? If you have a removable assistive device, remove it as told by your health care provider. ? Put ice in a plastic bag. ? Place a towel between your skin and the bag. If your health care provider tells you to keep the assistive device on during icing, place a towel between the assistive device and the bag. ? Leave the ice on for 20 minutes, 2-3 times a day. General instructions  Take over-the-counter and prescription medicines only as told by your health care provider.  Maintain a healthy weight. Follow instructions from your health care provider for weight control. These may include dietary restrictions.  Do not use any products that contain nicotine or tobacco, such as cigarettes and  e-cigarettes. These can delay bone healing. If you need help quitting, ask your health care provider.  Use assistive devices as directed by your health care provider.  Keep all follow-up visits as told by your health care provider. This is important. Where to find more information  [employer] of Arthritis and Musculoskeletal and Skin Diseases: www.niams.SouthExposed.es  [employer] on Aging: http://kim-miller.com/  American College of Rheumatology: www.rheumatology.org Contact a health care provider if:  Your skin turns red.  You develop a rash.  You have pain that gets worse.  You have a fever along with joint or muscle aches. Get help right away if:  You lose a lot of weight.  You suddenly lose your appetite.  You have night sweats. Summary  Osteoarthritis is a type of arthritis that affects tissue covering the ends of bones in joints (cartilage).  This condition is caused by age-related wearing down of cartilage that covers the ends of bones.  The main symptom of this condition is pain, swelling, and stiffness in the joint.  There is no cure for this condition, but treatment can help to control  pain and improve joint function. This information is not intended to replace advice given to you by your health care provider. Make sure you discuss any questions you have with your health care provider. Document Revised: 07/08/2017 Document Reviewed: 03/29/2016 Elsevier Patient Education  2020 [employer].

## 2020-02-19 NOTE — Assessment & Plan Note (Signed)
-   referral for physical therapy  - patient with minimal pain improvement after steroid injections, and does not wish to have surgery  - bilateral knee xrays, will notify patient of results when available

## 2020-02-19 NOTE — Assessment & Plan Note (Addendum)
Patient's blood pressure elevated to 192 and 592 in clinic systolic. Asymptomatic. Patient previously on amlodipine with side effect of LE edema so medication was discontinued. Patient and daughter request to not be on diuretic as it was unpleasant experience with urinary frequency in the past.  - patient to continue Coreg 12.5 BID  - continue cozaar  - recommend monitoring BP with BP journal at home  - follow up in 2 weeks for BP check, consider starting aldactone or increasing Coreg if home and clinic pressures remain elevated

## 2020-02-19 NOTE — Progress Notes (Addendum)
SUBJECTIVE:   CHIEF COMPLAINT / HPI: skin concerns   Skin Concerns  Ms. Dillow reports that for an unknown amount of time she has had red papules on her upper extremities and abdomen. She denies that theses spots are bleeding or pruritic. She also has noticed dark spots on her back that look like moles. She denies experiencing pain or itching of those spots. She reports that she is unsure how long her back has looked this way since she can not see and lives alone. Denies any history of melanoma or skin cancers. Patient asks if she could possibly look into having these spots removed.  Denies bloody stool or abdominal pain. Patient has unintentionally lost 6 pounds over the course of 7 months .  HTN  Patient with hx of hypertension. Had HA a few days ago, denies having current HA in clinic. Endorses some blurry vision but contributes this to glaucoma. Denies chest pain or SOB. Patient reports using a wrist BP cuff at home with measurements in the 140s/80s. She states that she normally has higher pressures when in the office due to white coat HTN. Patient reports regularly taking her lisinopril, coreg and cozaar.   Bilateral Knee Pain, Hx of OA  Patient and her daughter state that she has had chronic pain in bilateral knees due to OA. She has tried Tylenol, voltaren gel and different exercises at home without much relief. Patient reports having steroid injections into her knees but that she only has pain relief for ~2 weeks before the pain returns. She denies taking NSAIDS due to concern for interaction with her lisinopril. Both the patient and her daughter would like to avoid surgery Patient states that she is able to ambulate independently and has some pain if she sits for long periods of time.  PERTINENT  PMH / PSH: OA, HTN, Mild cognitive decline, osteopenia  OBJECTIVE:   BP (!) 192/82   Pulse (!) 57   Ht 5\' 2"  (1.575 m)   Wt 129 lb 12.8 oz (58.9 kg)   SpO2 97%   BMI 23.74 kg/m   Recheck  of BP: 198/82  General: female appearing stated age in no acute distress Cardio: Normal S1 and S2, no S3 or S4. Rhythm is regular. No murmurs or rubs.  Bilateral radial pulses palpable Pulm: Clear to auscultation bilaterally, no crackles, wheezing, Normal respiratory effort on RA  Abdomen: Bowel sounds normal. Abdomen soft. Extremities: no peripheral edema, no skin changes in patellar regions Skin: seborrhea keratoses diffusely located on back in "christmas tree" pattern, scant strawberry colored papules on abdomen and left forearm   ASSESSMENT/PLAN:   Benign essential HTN Patient's blood pressure elevated to 192 and 009 in clinic systolic. Asymptomatic. Patient previously on amlodipine with side effect of LE edema so medication was discontinued. Patient and daughter request to not be on diuretic as it was unpleasant experience with urinary frequency in the past.  - patient to continue Coreg 12.5 BID  - continue cozaar  - recommend monitoring BP with BP journal at home  - follow up in 2 weeks for BP check, consider starting aldactone or increasing Coreg if home and clinic pressures remain elevated   OA (osteoarthritis) of knee - referral for physical therapy  - patient with minimal pain improvement after steroid injections, and does not wish to have surgery  - bilateral knee xrays, will notify patient of results when available   Skin abnormalities Patient likely with seborrheic keratosis on most of back. Also a  few cherry angiomas.Given pattern of SKs, reviewed symptoms/signs of epigastric pain, weight loss (positive) and bloody stools. No pain or bloody stools.  - Patient counseled on likely benign nature of these skin changes  - reviewed red flag symptoms or skin changes to of which the patient and family should be mindful     Eulis Foster, MD [place]

## 2020-02-19 NOTE — Assessment & Plan Note (Signed)
Patient likely with seborrheic keratosis on most of back. Also a few cherry angiomas.Given pattern of SKs, reviewed symptoms/signs of epigastric pain, weight loss (positive) and bloody stools. No pain or bloody stools.  - Patient counseled on likely benign nature of these skin changes  - reviewed red flag symptoms or skin changes to of which the patient and family should be mindful

## 2020-02-26 ENCOUNTER — Ambulatory Visit
Admission: RE | Admit: 2020-02-26 | Discharge: 2020-02-26 | Disposition: A | Discharge status: Home / Self Care | Payer: PPO | Source: Ambulatory Visit | Attending: Family Medicine | Admitting: Family Medicine

## 2020-02-26 DIAGNOSIS — I700 Atherosclerosis of aorta: Secondary | ICD-10-CM | POA: Diagnosis not present

## 2020-02-26 DIAGNOSIS — M1712 Unilateral primary osteoarthritis, left knee: Secondary | ICD-10-CM | POA: Diagnosis not present

## 2020-02-26 DIAGNOSIS — M25512 Pain in left shoulder: Secondary | ICD-10-CM | POA: Diagnosis not present

## 2020-02-26 DIAGNOSIS — G8929 Other chronic pain: Secondary | ICD-10-CM

## 2020-02-26 DIAGNOSIS — M170 Bilateral primary osteoarthritis of knee: Secondary | ICD-10-CM

## 2020-02-26 DIAGNOSIS — I7090 Unspecified atherosclerosis: Secondary | ICD-10-CM | POA: Diagnosis not present

## 2020-02-26 DIAGNOSIS — M1711 Unilateral primary osteoarthritis, right knee: Secondary | ICD-10-CM | POA: Diagnosis not present

## 2020-03-06 ENCOUNTER — Other Ambulatory Visit: Payer: Self-pay

## 2020-03-06 ENCOUNTER — Ambulatory Visit: Payer: PPO | Attending: Family Medicine

## 2020-03-06 DIAGNOSIS — G8929 Other chronic pain: Secondary | ICD-10-CM | POA: Diagnosis not present

## 2020-03-06 DIAGNOSIS — M25561 Pain in right knee: Secondary | ICD-10-CM | POA: Diagnosis not present

## 2020-03-06 DIAGNOSIS — M6281 Muscle weakness (generalized): Secondary | ICD-10-CM | POA: Diagnosis not present

## 2020-03-06 DIAGNOSIS — M25562 Pain in left knee: Secondary | ICD-10-CM | POA: Insufficient documentation
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
## 2020-03-06 NOTE — Patient Instructions (Signed)
1-2 x/day  SLR , bridge, side lye abduction,  Marching   RT/LT.  10 reps

## 2020-03-07 ENCOUNTER — Ambulatory Visit (INDEPENDENT_AMBULATORY_CARE_PROVIDER_SITE_OTHER): Payer: PPO | Admitting: Family Medicine

## 2020-03-07 ENCOUNTER — Other Ambulatory Visit: Payer: Self-pay

## 2020-03-07 ENCOUNTER — Encounter: Payer: Self-pay | Admitting: Family Medicine

## 2020-03-07 DIAGNOSIS — I10 Essential (primary) hypertension: Secondary | ICD-10-CM

## 2020-03-07 MED ORDER — LOSARTAN POTASSIUM 100 MG PO TABS: 100.0000 mg | ORAL_TABLET | Freq: Every day | ORAL | 3 refills | Status: DC

## 2020-03-07 MED ORDER — INDAPAMIDE 1.25 MG PO TABS: 1.2500 mg | ORAL_TABLET | Freq: Every day | ORAL | 0 refills | Status: DC

## 2020-03-07 NOTE — Patient Instructions (Signed)
It was a pleasure to see you today!  Thank you for choosing [facility] for your primary care.  Meredith Owens was seen for blood pressure follow-up.   Our plans for today were:  I will prescribe a new medication called indapamide at 1.25 mg daily.  Please follow-up with me in 4 weeks to check your blood pressure.  Please continue to check your blood pressure 1-2 times daily and I recommend keeping a journal to bring with you to your next appointment.  Please continue to take your losartan 100 mg daily.  Plan to return to care if you begin to experience worsening headaches, worsening blurry vision, chest pain or shortness of breath.    You should return to our clinic in 4 weeks for blood pressure follow-up.  Best Wishes,   Dr. Alba Cory    Indapamide Oral Tablets What is this medicine? INDAPAMIDE (in DAP a mide) is a diuretic. It helps you make more urine and to lose salt and excess water from your body. It treats swelling from heart disease. It also treats high blood pressure. This medicine may be used for other purposes; ask your health care provider or pharmacist if you have questions. COMMON BRAND NAME(S): Lozol What should I tell my health care provider before I take this medicine? They need to know if you have any of these conditions:  diabetes, high blood sugar  gout  heart failure  irregular heartbeat or rhythm  kidney disease  liver disease  lupus  an unusual or allergic reaction to indapamide, sulfa drugs, other drugs, foods, dyes or preservatives  pregnant or trying to get pregnant  breast-feeding How should I use this medicine? Take this drug by mouth. Take it as directed on the prescription label at the same time every day. Keep taking it unless your health care provider tells you to stop. Talk to your health care provider about the use of this drug in children. Special care may be needed. Overdosage: If you think you have taken too  much of this medicine contact a poison control center or emergency room at once. NOTE: This medicine is only for you. Do not share this medicine with others. What if I miss a dose? If you miss a dose, take it as soon as you can. If it is almost time for your next dose, take only that dose. Do not take double or extra doses. What may interact with this medicine?  heart medicines like digoxin  lithium  other medicines for high blood pressure This list may not describe all possible interactions. Give your health care provider a list of all the medicines, herbs, non-prescription drugs, or dietary supplements you use. Also tell them if you smoke, drink alcohol, or use illegal drugs. Some items may interact with your medicine. What should I watch for while using this medicine? Visit your health care provider for regular checks on your progress. Check your blood pressure as directed. Ask your health care provider what your blood pressure should be. Also, find out when you should contact him or her. Check with your health care provider if you have severe diarrhea, nausea, and vomiting, or if you sweat a lot. The loss of too much body fluid may make it dangerous for you to take this drug. You may need to be on a special diet while you are taking this drug. Ask your health care provider. Also, find out how many glasses of fluids you need to drink each day. Do not  treat yourself for coughs, colds, or pain while you are using this drug without asking your health care provider for advice. Some drugs may increase your blood pressure. You may get drowsy or dizzy. Do not drive, use machinery, or do anything that needs mental alertness until you know how this drug affects you. Do not stand up or sit up quickly, especially if you are an older patient. This reduces the risk of dizzy or fainting spells. This drug may increase blood sugar. Ask your health care provider if changes in diet or drugs are needed if you have  diabetes. What side effects may I notice from receiving this medicine? Side effects that you should report to your doctor or health care provider as soon as possible:  allergic reactions (skin rash, itching or hives; swelling of the face, lips, or tongue)  infection (fever, chills, cough, sore throat, pain or trouble passing urine)  liver injury (dark yellow or brown urine; general ill feeling or flu-like symptoms; loss of appetite, right upper belly pain; unusually weak or tired, yellowing of the eyes or skin)  low magnesium levels (fast, irregular heartbeat; muscle cramp or pain; muscle weakness; tremors; seizures)  low potassium levels (trouble breathing; chest pain; dizziness; fast, irregular heartbeat; feeling faint or lightheaded, falls; muscle cramps or pain)  low red blood cell counts (trouble breathing; feeling faint; lightheaded, falls; unusually weak or tired)  redness, blistering, peeling, or loosening of the skin, including inside the mouth  severe diarrhea Side effects that usually do not require medical attention (report to your doctor or health care provider if they continue or are bothersome):  anxious  back pain  headache  increased thirst  loss of appetite  nasal congestion (like runny or stuffy nose)  unusual bruising or bleeding  unusual sweating  vomiting This list may not describe all possible side effects. Call your doctor for medical advice about side effects. You may report side effects to FDA at 1-800-FDA-1088. Where should I keep my medicine? Keep out of the reach of children and pets. Store at room temperature between 20 and 25 degrees C (68 and 77 degrees F). Avoid exposure to extreme heat. Throw away any unused drug after the expiration date. NOTE: This sheet is a summary. It may not cover all possible information. If you have questions about this medicine, talk to your doctor, pharmacist, or health care provider.  2020 Elsevier/Gold Standard  (2019-05-22 14:20:36)

## 2020-03-07 NOTE — Progress Notes (Signed)
    SUBJECTIVE:   CHIEF COMPLAINT / HPI: HTN follow up   HTN follow up  Reports some shooting pains on right side of head that has been occurring during the last month. She continues to deny chest pain or SOB during periods of elevated BP measurements both home and in the office. Patient reports she was previously on amlodipine but that it was discontinued due to LE edema. Patient and her daughter request to be mindful of diuretic choice as she had been prescribed a diuretic before and had to urinate every 20 minutes.   Reports elevated BP of 192/82 when measuring at home.   PERTINENT  PMH / PSH: HTN  Memory loss  OA of knee   OBJECTIVE:   BP (!) 180/60   Pulse 60   Ht 5\' 1"  (1.549 m)   Wt 127 lb 6.4 oz (57.8 kg)   SpO2 100%   BMI 24.07 kg/m    General: female appearing stated age in no acute distress HEENT: EOMI bilaterally Cardio: Normal S1 and S2, no S3 or S4. Rhythm is regular, Bilateral radial pulses palpable Pulm: Clear to auscultation bilaterally, no crackles, wheezing, or diminished breath sounds. Normal respiratory effor Abdomen: Bowel sounds normal. Abdomen soft and non-tender.  Extremities: no LE edema   ASSESSMENT/PLAN:   Benign essential HTN Blood pressure remains elevated both per home reports and in clinic on two occassions.  - add indapamide - continue losartan for renal protection given CKD history, refill sent     Follow up in 4 weeks for BP check   Eulis Foster, MD [place]

## 2020-03-07 NOTE — Assessment & Plan Note (Deleted)
Patient presenting for follow up after knee xray. Results showed degenerative changes to knee joint.  - PT referral?  - prescribe

## 2020-03-11 ENCOUNTER — Ambulatory Visit: Payer: PPO

## 2020-03-11 NOTE — Assessment & Plan Note (Signed)
Blood pressure remains elevated both per home reports and in clinic on two occassions.  - add indapamide - continue losartan for renal protection given CKD history, refill sent

## 2020-03-12 ENCOUNTER — Other Ambulatory Visit: Payer: Self-pay | Admitting: *Deleted

## 2020-03-12 MED ORDER — CALCIUM CARBONATE-VITAMIN D 600-400 MG-UNIT PO TABS: 2.0000 | ORAL_TABLET | Freq: Every day | ORAL | 1 refills | Status: DC

## 2020-03-19 ENCOUNTER — Ambulatory Visit: Payer: PPO | Admitting: Physical Therapy

## 2020-03-26 ENCOUNTER — Encounter: Payer: PPO | Admitting: Physical Therapy

## 2020-03-30 ENCOUNTER — Other Ambulatory Visit: Payer: Self-pay | Admitting: Family Medicine

## 2020-04-02 ENCOUNTER — Encounter: Payer: PPO | Admitting: Physical Therapy

## 2020-04-08 ENCOUNTER — Encounter: Payer: Self-pay | Admitting: Family Medicine

## 2020-04-08 ENCOUNTER — Ambulatory Visit (INDEPENDENT_AMBULATORY_CARE_PROVIDER_SITE_OTHER): Payer: PPO | Admitting: Family Medicine

## 2020-04-08 ENCOUNTER — Other Ambulatory Visit: Payer: Self-pay

## 2020-04-08 VITALS — BP 152/72 | HR 73 | Wt 123.8 lb

## 2020-04-08 DIAGNOSIS — I10 Essential (primary) hypertension: Secondary | ICD-10-CM

## 2020-04-08 DIAGNOSIS — Z0000 Encounter for general adult medical examination without abnormal findings: Secondary | ICD-10-CM

## 2020-04-08 DIAGNOSIS — E049 Nontoxic goiter, unspecified: Secondary | ICD-10-CM

## 2020-04-08 DIAGNOSIS — Z1159 Encounter for screening for other viral diseases: Secondary | ICD-10-CM | POA: Diagnosis not present

## 2020-04-08 MED ORDER — INDAPAMIDE 1.25 MG PO TABS: 1.2500 mg | ORAL_TABLET | Freq: Every day | ORAL | 1 refills | Status: DC

## 2020-04-08 NOTE — Patient Instructions (Signed)
It was a pleasure to see you today!  Thank you for choosing [facility] for your primary care.  Meredith Owens was seen for follow up for HTN .   Our plans for today were:  Please continue to take the indapamide as prescribed. I am so excited to see that your blood pressure has improved!   Please continue to measure your blood pressure 2-3 times per week and let us know if you notice very low (less than 100 top numbers) or very high (>170 top numbers.   I recommend that you try to eat at least three meals with vegetable and protein per day to make sure you are able to maintain a healthy weight.   We will be checking your thyroid hormones today and I will call you with any abnormal results.    Best Wishes,   Dr. Alba Cory

## 2020-04-08 NOTE — Assessment & Plan Note (Signed)
Patient reports that goiter seems to be more noticeable in the recent days. Upon chart review this is problem noted before.  - TSH

## 2020-04-08 NOTE — Assessment & Plan Note (Signed)
Hep C screening.

## 2020-04-08 NOTE — Progress Notes (Signed)
    SUBJECTIVE:   CHIEF COMPLAINT / HPI: follow up for BP   HTN  Patient presents with her daughter and both report that the patient has had improved BP measurements since adding the indapamide. Patient does not report bothersome urinary symptoms. She states that she recently purchased a new blood pressure cuff and has been able to record her measurements in her note pad. Personally reviewed patient's BP record and noted averages of 827-078 systolic. Patient denies HA, vision changes (from baseline), chest pain or SOB.  Patient states she has also adjusted her diet to include less salt heavy foods and denies experiencing any dizziness since adding the new medication.   Goiter Patient reports noticing a swelling in her neck that she is concerned may be a goiter. Patient denies any tenderness upon palpation. Discussed common symptoms on hyper and hypothyroidism to which both the patient and her daughter verbalized understanding but did not endorse any symptoms in the patient that matched those descriptions.    PERTINENT  PMH / PSH: HTN    OBJECTIVE:   BP (!) 152/72   Pulse 73   Wt 123 lb 12.8 oz (56.2 kg)   SpO2 99%   BMI 23.39 kg/m   General: female appearing stated age in no acute distress HEENT: 2cm goiter noted, non tender to palpation, chronic ptosis of left eyelid Cardio: Normal S1 and S2, no S3 or S4. Rhythm is regular.   Bilateral radial pulses palpable Pulm: Clear to auscultation bilaterally, no crackles, wheezing, or diminished breath sounds. Normal respiratory effort Extremities: No LE edema   ASSESSMENT/PLAN:   Benign essential HTN BP improved from prior visits to 152/72, acceptable for age.  - continue indapamide, losartan and coreg   Healthcare maintenance Hep C screening   Goiter Patient reports that goiter seems to be more noticeable in the recent days. Upon chart review this is problem noted before.  - TSH      Eulis Foster, MD [place]

## 2020-04-08 NOTE — Assessment & Plan Note (Signed)
BP improved from prior visits to 152/72, acceptable for age.  - continue indapamide, losartan and coreg

## 2020-04-09 LAB — TSH: TSH: 1.59 u[IU]/mL (ref 0.450–4.500)

## 2020-04-18 ENCOUNTER — Telehealth: Payer: Self-pay

## 2020-04-18 NOTE — Telephone Encounter (Signed)
Patient calls nurse line requesting lab results from 04/08/20. Informed patient of normal TSH results. Patient verbalized understanding and will schedule appointment in November for follow up or sooner if needed.   Talbot Grumbling, RN

## 2020-04-23 ENCOUNTER — Other Ambulatory Visit: Payer: Self-pay | Admitting: *Deleted

## 2020-04-23 MED ORDER — CARVEDILOL 12.5 MG PO TABS: 12.5000 mg | ORAL_TABLET | Freq: Two times a day (BID) | ORAL | 2 refills | Status: DC

## 2020-04-29 ENCOUNTER — Ambulatory Visit: Payer: PPO | Admitting: Licensed Clinical Social Worker

## 2020-04-29 DIAGNOSIS — Z7189 Other specified counseling: Secondary | ICD-10-CM

## 2020-04-29 NOTE — Chronic Care Management (AMB) (Signed)
Care Management   Clinical Social Work Follow Up   04/29/2020 Name: Meredith Owens MRN: [id number] DOB: 11-02-1940 Referred by: Meredith Foster, MD  Reason for referral : Care Coordination (advance directives)  Meredith Owens is a 79 y.o. year old female who is a primary care patient of Simmons-Robinson, Makiera, MD.  Reason for follow-up: Returned call from patient  Assessment: Patient continues to experience barriers with moving forward to complete advance directives.  Plan: LCSW will F/U with patient in 2 weeks Advance Directive Status: [place] for related entries.  SDOH (Social Determinants of Health) assessments performed:; No needs identified   Goals Addressed            This Visit's Progress     Acknowledge receipt of Advanced Directive package   Not on track    Meredith Owens (see longitudinal plan of care for additional care plan information)  Current Barriers & process:   Patient does not have an Advance Directive  Acknowledges deficits, education and support in order to complete this document  Limited education about a healthcare power of attorney  Patient decided not to seek legal counsel Senior Legal aide  Clinical Social Work Goal(s):   Over the next 20 days, the patient will review Advance Directive with children as evidenced by patient self report of review  Over the next 45 days, the patient will  work with LCSW on completion of Advance Directive, notarize and provide a copy to provider office Interventions provided by LCSW:  Assessed for barriers with moving forward to complete document and process concerns with patient  A voluntary discussion about advanced care planning including importance of advanced directives, healthcare proxy and living will was discussed with the patient.   Will provide education to patient's children and send [facility] education when they call  Other intervention: Motivational interviewing and solution focused    Patient Self Care Activities:   Is able to complete documentation independently  Able to identify next of kin or [place] of Attorney/Health Care Agent  Patient needs help from LCSW to education children on health care agent.  Please see past updates related to this goal by clicking on the "Past Updates" button in the selected goal       Outpatient Encounter Medications as of 04/29/2020  Medication Sig   acetaminophen (TYLENOL 8 HOUR) 650 MG CR tablet Take 1 tablet (650 mg total) by mouth every 8 (eight) hours as needed for pain.   aspirin 81 MG tablet Take 81 mg by mouth daily.   Calcium Carbonate-Vitamin D 600-400 MG-UNIT tablet Take 2 tablets by mouth daily.   Calcium Carbonate-Vitamin D3 600-400 MG-UNIT TABS Take 2 tablets by mouth daily.   carvedilol (COREG) 12.5 MG tablet Take 1 tablet (12.5 mg total) by mouth 2 (two) times daily.   [employer] D3 50 MCG (2000 UT) CAPS TAKE 1 CAPSULE BY MOUTH EVERY DAY   cycloSPORINE (RESTASIS OP) Apply to eye.   diclofenac sodium (VOLTAREN) 1 % GEL Apply 4 g topically 4 (four) times daily.   dorzolamide-timolol (COSOPT) 22.3-6.8 MG/ML ophthalmic solution 1 drop 2 (two) times daily.   indapamide (LOZOL) 1.25 MG tablet Take 1 tablet (1.25 mg total) by mouth daily.   losartan (COZAAR) 100 MG tablet Take 1 tablet (100 mg total) by mouth daily.   RESTASIS 0.05 % ophthalmic emulsion    ROCKLATAN 0.02-0.005 % SOLN INSTILL 1 DROP INTO LEFT EYE AT BEDTIME   rosuvastatin (CRESTOR) 10 MG tablet Take 1  tablet (10 mg total) by mouth every other day.   White Petrolatum-Mineral Oil (CVS EYE LUBRICANT OP) Apply to eye.   No facility-administered encounter medications on file as of 04/29/2020.   Review of patient status, including review of consultants reports, relevant laboratory and other test results, and collaboration with appropriate care team members and the patient's provider was performed as part of comprehensive patient evaluation and  provision of care management services.    Casimer Lanius, [place] / [place]   [phone] 3:56 PM

## 2020-05-06 ENCOUNTER — Ambulatory Visit: Payer: PPO | Admitting: Licensed Clinical Social Worker

## 2020-05-06 DIAGNOSIS — Z7189 Other specified counseling: Secondary | ICD-10-CM

## 2020-05-06 NOTE — Chronic Care Management (AMB) (Signed)
   Social Work  Care Management Collaboration 05/06/2020 Name: Meredith Owens MRN: [id number] DOB: Jan 05, 1941 Meredith Owens is a 79 y.o. year old female who sees Simmons-Robinson, Riki Sheer, MD for primary care. LCSW was consulted by patient's son and daught to provide them with information and education on advance directives.  Intervention: Patient was not interviewed or contacted during this encounter.  LCSW provided educational information. Plan: LCSW will F/U with family to answer questions  Review of patient status, including review of consultants reports, relevant laboratory and other test results, and collaboration with appropriate care team members and the patient's provider was performed as part of comprehensive patient evaluation and provision of chronic care management services.    Goals Addressed            This Visit's Progress   .  Acknowledge receipt of Advanced Directive package   Not on track    [place] (see longitudinal plan of care for additional care plan information)  Current Barriers & process:  . Patient does not have an Forensic scientist . Acknowledges deficits, education and support in order to complete this document . Limited education about a healthcare power of attorney . Patient decided not to seek legal counsel Senior Legal aide  Clinical Social Work Goal(s):  Marland Kitchen Over the next 20 days, the patient will review Advance Directive with children as evidenced by patient self report of review . Over the next 45 days, the patient will  work with LCSW on completion of Advance Directive, notarize and provide a copy to provider office Interventions provided by LCSW: . Assessed for barriers with moving forward to complete document and process concerns with patient . A voluntary discussion about advanced care planning including importance of advanced directives, healthcare proxy and living will was discussed with the patient.  . Will provide education to patient's  children and send [facility] education when they call . Other intervention: Motivational interviewing and solution focused  . Reviewed advance directive information with patients' son and daughter( provided EMMI education and paper copies) Patient Self Care Activities:  . Is able to complete documentation independently . Able to identify next of kin or [place] . Patient needs help from LCSW to education children on health care agent Please see past updates related to this goal by clicking on the "Past Updates" button in the selected goal     Meredith Owens, [place] / [place]   [phone] 4:11 PM

## 2020-05-08 ENCOUNTER — Other Ambulatory Visit: Payer: Self-pay | Admitting: Family Medicine

## 2020-05-14 ENCOUNTER — Ambulatory Visit (INDEPENDENT_AMBULATORY_CARE_PROVIDER_SITE_OTHER): Payer: PPO

## 2020-05-14 ENCOUNTER — Other Ambulatory Visit: Payer: Self-pay

## 2020-05-14 DIAGNOSIS — Z23 Encounter for immunization: Secondary | ICD-10-CM | POA: Diagnosis not present

## 2020-05-14 NOTE — Progress Notes (Signed)
   Covid-19 Vaccination Clinic  Name:  Meredith Owens    MRN: [id number] DOB: November 07, 1940  05/14/2020  Meredith Owens was observed post Covid-19 immunization for 15 minutes without incident. She was provided with Vaccine Information Sheet and instruction to access the V-Safe system.   Meredith Owens was instructed to call 911 with any severe reactions post vaccine: Marland Kitchen Difficulty breathing  . Swelling of face and throat  . A fast heartbeat  . A bad rash all over body  . Dizziness and weakness   Covid Booster administered RD without complication.

## 2020-05-15 ENCOUNTER — Ambulatory Visit: Payer: PPO | Admitting: Licensed Clinical Social Worker

## 2020-05-15 DIAGNOSIS — Z719 Counseling, unspecified: Secondary | ICD-10-CM

## 2020-05-15 DIAGNOSIS — Z7189 Other specified counseling: Secondary | ICD-10-CM

## 2020-05-15 NOTE — Patient Instructions (Signed)
°  Meredith Owens  it was nice speaking with you. Please call me directly if you have questions about the goals we discussed.  Goals Addressed            This Visit's Progress     Acknowledge receipt of Advanced Directive package   On track    F/U in 1 to 2 weeks  review and complete Advance Directive packet,   Decide who you want to select as your health care agent  have notarized and provide a copy to provider office  review packet with your son and daughter      Meredith Owens received Care Management services today:  1. Care Management services include personalized support from designated clinical staff supervised by her physician, including individualized plan of care and coordination with other care providers 2. 24/7 contact [phone] for assistance for urgent and routine care needs. 3. Care Management are voluntary services and be declined at any time by calling the office.  Patient verbalizes understanding of instructions provided today.  Follow up plan: SW will follow up with patient by phone over the next 2 weeks  RILEE KNOLL, [place]

## 2020-05-15 NOTE — Chronic Care Management (AMB) (Signed)
Care Management   Clinical Social Work Follow Up   05/15/2020 Name: Meredith Owens MRN: [id number] DOB: [birth year] Referred by: Eulis Foster, MD  Reason for referral : Care Coordination (advance directive)  Meredith Owens is a 79 y.o. year old female who is a primary care patient of Simmons-Robinson, Makiera, MD.  Reason for follow-up: assess for barriers and progress with completing advance directive.    Assessment: Patient continues to experience difficulty with getting Advance Directive compled which seems to be creating a level of stress for her.  Barriers: Getting family to understand her wishes and support her in moving forward with this process   Recommendation: Patient may benefit from, and is in agreement to meet with her son and daughter after LCSW explains advance directives to them and answer questions.  Interventions: - advance care planning facilitated; assessed understand and answered questions  - counseling provided - decision-making supported - family care conference offered - verbalization of feelings encouraged - called daughter [name] and answered questions   Review of patient status, including review of consultants reports, relevant laboratory and other test results, and collaboration with appropriate care team members and the patient's provider was performed as part of comprehensive patient evaluation and provision of care management services.  Plan:  1. Patient would like continued follow-up from CCM LCSW 2. Family will call LCSW if questions while completing advance directive 3. LCSW will f/U with patient and family if no call within 2 weeks  Advance Directive Status: [place]  for related entries.  SDOH (Social Determinants of Health) assessments performed:; No needs identified   Goals Addressed            This Visit's Progress   .  Acknowledge receipt of Advanced Directive package   On track    F/U in 1 to 2 weeks . review and complete  Advance Directive packet,  . Decide who you want to select as your health care agent . have notarized and provide a copy to provider office . review packet with your son and daughter       Outpatient Encounter Medications as of 05/15/2020  Medication Sig  . acetaminophen (TYLENOL 8 HOUR) 650 MG CR tablet Take 1 tablet (650 mg total) by mouth every 8 (eight) hours as needed for pain.  Marland Kitchen aspirin 81 MG tablet Take 81 mg by mouth daily.  Marland Kitchen CALCIUM + VITAMIN D3 600-10 MG-MCG TABS TAKE 2 TABLETS BY MOUTH EVERY DAY  . Calcium Carbonate-Vitamin D 600-400 MG-UNIT tablet Take 2 tablets by mouth daily.  . carvedilol (COREG) 12.5 MG tablet Take 1 tablet (12.5 mg total) by mouth 2 (two) times daily.  . CVS D3 50 MCG (2000 UT) CAPS TAKE 1 CAPSULE BY MOUTH EVERY DAY  . cycloSPORINE (RESTASIS OP) Apply to eye.  . diclofenac sodium (VOLTAREN) 1 % GEL Apply 4 g topically 4 (four) times daily.  . dorzolamide-timolol (COSOPT) 22.3-6.8 MG/ML ophthalmic solution 1 drop 2 (two) times daily.  . indapamide (LOZOL) 1.25 MG tablet Take 1 tablet (1.25 mg total) by mouth daily.  Marland Kitchen losartan (COZAAR) 100 MG tablet Take 1 tablet (100 mg total) by mouth daily.  . RESTASIS 0.05 % ophthalmic emulsion   . ROCKLATAN 0.02-0.005 % SOLN INSTILL 1 DROP INTO LEFT EYE AT BEDTIME  . rosuvastatin (CRESTOR) 10 MG tablet Take 1 tablet (10 mg total) by mouth every other day.  Dema [place] Petrolatum-Mineral Oil (CVS EYE LUBRICANT OP) Apply to eye.   No facility-administered encounter medications  on file as of 05/15/2020.   Casimer Lanius, [place] / [place]   [phone] 1:48 PM

## 2020-06-03 ENCOUNTER — Ambulatory Visit: Payer: PPO | Admitting: Licensed Clinical Social Worker

## 2020-06-03 DIAGNOSIS — Z7189 Other specified counseling: Secondary | ICD-10-CM

## 2020-06-03 DIAGNOSIS — H401222 Low-tension glaucoma, left eye, moderate stage: Secondary | ICD-10-CM | POA: Diagnosis not present

## 2020-06-03 DIAGNOSIS — Z719 Counseling, unspecified: Secondary | ICD-10-CM

## 2020-06-03 DIAGNOSIS — H02422 Myogenic ptosis of left eyelid: Secondary | ICD-10-CM | POA: Diagnosis not present

## 2020-06-03 DIAGNOSIS — H43812 Vitreous degeneration, left eye: Secondary | ICD-10-CM | POA: Diagnosis not present

## 2020-06-03 NOTE — Chronic Care Management (AMB) (Addendum)
  Care Management   Clinical Social Work Follow Up   06/03/2020 Name: Meredith Owens MRN: [id number] DOB: 08-06-1941 Referred by: Meredith Foster, MD  Reason for referral : Care Coordination  Meredith Owens is a 79 y.o. year old female who is a primary care patient of Simmons-Robinson, Makiera, MD.  Reason for follow-up: assess for barriers and progress with advance directives.   Assessment:Patient is not making progress towards goal. States she is having difficulty getting her children on the same page .   Plan: LCSW will discontinue outreach but will be available to assist patient and family when they are ready to move forward   Interventions provided by LCSW:  Assessment of needs, as well as how impacting, barriers , progress and outcome     Provided education to patient about advanced directives Motivational Interviewing Emotional/Supportive Counseling  Psychoeducation and/or Health Education     Advance Directive Status: N See Care Plan  for related entries. ;  SDOH (Social Determinants of Health) assessments performed:; No new needs identified    Goals Addressed             This Visit's Progress     Acknowledge receipt of Advanced Directive package   Not on track    F/U in 1 to 2 weeks review and complete Advance Directive packet,  Decide who you want to select as your health care agent have notarized and provide a copy to provider office review packet with your son and daughter         Outpatient Encounter Medications as of 06/03/2020  Medication Sig   acetaminophen (TYLENOL 8 HOUR) 650 MG CR tablet Take 1 tablet (650 mg total) by mouth every 8 (eight) hours as needed for pain.   aspirin 81 MG tablet Take 81 mg by mouth daily.   CALCIUM + VITAMIN D3 600-10 MG-MCG TABS TAKE 2 TABLETS BY MOUTH EVERY DAY   Calcium Carbonate-Vitamin D 600-400 MG-UNIT tablet Take 2 tablets by mouth daily.   carvedilol (COREG) 12.5 MG tablet Take 1 tablet (12.5 mg total) by mouth  2 (two) times daily.   CVS D3 50 MCG (2000 UT) CAPS TAKE 1 CAPSULE BY MOUTH EVERY DAY   cycloSPORINE (RESTASIS OP) Apply to eye.   diclofenac sodium (VOLTAREN) 1 % GEL Apply 4 g topically 4 (four) times daily.   dorzolamide-timolol (COSOPT) 22.3-6.8 MG/ML ophthalmic solution 1 drop 2 (two) times daily.   indapamide (LOZOL) 1.25 MG tablet Take 1 tablet (1.25 mg total) by mouth daily.   losartan (COZAAR) 100 MG tablet Take 1 tablet (100 mg total) by mouth daily.   RESTASIS 0.05 % ophthalmic emulsion    ROCKLATAN 0.02-0.005 % SOLN INSTILL 1 DROP INTO LEFT EYE AT BEDTIME   rosuvastatin (CRESTOR) 10 MG tablet Take 1 tablet (10 mg total) by mouth every other day.   White Petrolatum-Mineral Oil (CVS EYE LUBRICANT OP) Apply to eye.   No facility-administered encounter medications on file as of 06/03/2020.   Review of patient status, including review of consultants reports, relevant laboratory and other test results, and collaboration with appropriate care team members and the patient's provider was performed as part of comprehensive patient evaluation and provision of care management services.   Meredith Owens, [place] / [place]   [phone] 3:32 PM   I have reviewed this visit and agree with the documentation.  Meredith Foster, MD Meredith Owens, PGY-2 [phone]

## 2020-06-03 NOTE — Patient Instructions (Signed)
°  Ms. Venezia  it was nice speaking with you. Please call me directly [phone] if you have questions about the goals we discussed. Goals Addressed            This Visit's Progress     Acknowledge receipt of Advanced Directive package   Not on track    F/U in 1 to 2 weeks  review and complete Advance Directive packet,   Decide who you want to select as your health care agent  have notarized and provide a copy to provider office  review packet with your son and daughter       Ms. Saban received Care Management services today:  1. Care Management services include personalized support from designated clinical staff supervised by her physician, including individualized plan of care and coordination with other care providers 2. 24/7 contact [phone] for assistance for urgent and routine care needs. 3. Care Management are voluntary services and be declined at any time by calling the office.  Patient verbalizes understanding of instructions provided today.  Follow up plan: Client will LCSW as needed  Maurine Cane, LCSW

## 2020-06-27 ENCOUNTER — Ambulatory Visit (INDEPENDENT_AMBULATORY_CARE_PROVIDER_SITE_OTHER): Payer: PPO | Admitting: Family Medicine

## 2020-06-27 ENCOUNTER — Encounter: Payer: Self-pay | Admitting: Family Medicine

## 2020-06-27 ENCOUNTER — Other Ambulatory Visit: Payer: Self-pay

## 2020-06-27 VITALS — BP 156/62 | HR 54 | Ht 61.0 in | Wt 123.2 lb

## 2020-06-27 DIAGNOSIS — R7989 Other specified abnormal findings of blood chemistry: Secondary | ICD-10-CM | POA: Diagnosis not present

## 2020-06-27 DIAGNOSIS — Z23 Encounter for immunization: Secondary | ICD-10-CM

## 2020-06-27 DIAGNOSIS — M170 Bilateral primary osteoarthritis of knee: Secondary | ICD-10-CM | POA: Diagnosis not present

## 2020-06-27 DIAGNOSIS — I10 Essential (primary) hypertension: Secondary | ICD-10-CM | POA: Diagnosis not present

## 2020-06-27 NOTE — Progress Notes (Addendum)
SUBJECTIVE:   CHIEF COMPLAINT / HPI:   Meredith Owens (MRN: [id number]) is a 79 y.o. female with a history of HTN and OA of both knees who presents for a BP checkup and evaluation of left shoulder and bilateral knee pain. Her daughter accompanies her today.  HTN Patient brought in recordings of BP taken nearly every day Sept-Nov that ranged 111-126/42-61. She uses a wrist cuff at home due to difficulties with getting arm cuff tight enough around her arm. She reports taking her indapamide, losartan, and carvedilol mostly as prescribed and has only forgotten a dose rarely. She states that her BP is high today due to "white coat syndrome." She is not in need of refills.  Osteoarthritis Patient endorses pain of her L shoulder and knees, although her L knee hurts worse. She went to PT once after referral at previous visit, but she did not like it and did not go back. She has had steroid injections before, but they only helped for 2 weeks. She states that voltaren gel works with the pain, but once she gets to moving, it does not help as much. She takes tylenol and uses a heating pad every so often, and these help to ease the pain. She is curious about being referred to pain management to get a stronger medicine, but they were told previously her kidneys were weak and could not handle stronger medications.  PERTINENT  PMH / PSH: HTN, OA / her daughter helps care for her  OBJECTIVE:   BP (!) 156/62    Pulse (!) 54    Ht 5\' 1"  (1.549 m)    Wt 123 lb 3.2 oz (55.9 kg)    SpO2 100%    BMI 23.28 kg/m    PHYSICAL EXAM  GEN: well developed, well-nourished, in NAD HEAD: NCAT, neck supple  CVS: RRR, normal S1/S2, no murmurs, rubs, gallops, 2+ radial pulses  RESP: Breathing comfortably on RA, no retractions, wheezes, rhonchi, or crackles  NEU: alert and oriented to place and time, CN II-XII intact, no gross motor or sensory deficits. Ptosis of left eye.  ASSESSMENT/PLAN:   Benign essential  HTN Patient's BP continues to be elevated in the office despite medication adherence. Potentially manifestation of white coat syndrome, as this has happened before. Continue taking and recording BP at home. Return in 4 weeks for reevaluation of BP.   Primary osteoarthritis of both knees Patient reports continued pain in both knees and shoulders despite steroid injections. Because the injections last 2 weeks and risk of degeneration of joint cartilage, will not proceed with another injection. Tramadol from pain management is not best choice either due to side effects and elevated creatinine. Continue use of voltaren gel, tylenol, and heating pads as needed for control of pain since these help the pain. Will refer to orthopedic surgery for further evaluation and potential need for surgery.  Elevated creatinine Ordered CMP due to patient's consistently elevated creatinine levels to assess progression since June 2021.  [place]  I have independently interviewed and examined the patient. I have discussed the above with the original author and agree with their documentation. Please see also any attending notes.  Additionally, patient noted to have elevated calcium on lab results from June so will monitor these levels and discuss need to continue vs discontinue indapamide at follow up visit in 4 weeks.   Eulis Foster, MD PGY-2, [facility] 06/27/2020 5:14 PM

## 2020-06-27 NOTE — Assessment & Plan Note (Signed)
Patient's BP continues to be elevated in the office despite medication adherence. Potentially manifestation of white coat syndrome, as this has happened before. Continue taking and recording BP at home. Return in 4 weeks for reevaluation of BP.

## 2020-06-27 NOTE — Patient Instructions (Addendum)
Thank you for choosing [facility] for your care today.  For your knee and shoulder pain, I recommend that you go to an orthopedic doctor for further evaluation.  Your knees especially may be at the point where we consider surgery.  I do not recommend a steroid injection as you did not get relief from this last time in we do not want to risk any further damage to your joint spaces.  Please continue to use Voltaren gel and heating pad in the meantime for pain control.  Please notify our office if you do not hear from the orthopedics office within the next 2 weeks.  The medication called tramadol may not be very beneficial to you does not have a lot of side effects that could be damaging to your system.  I recommend using Tylenol arthritis regularly to help with pain.   Your blood pressure continues to be elevated in the office with normal recording pressures.  Please follow-up with [place] in 4 weeks for blood pressure check.  As your kidney levels have been somewhat elevated, we will check these levels today.

## 2020-06-27 NOTE — Assessment & Plan Note (Addendum)
Patient reports continued pain in both knees and shoulders despite steroid injections. Because the injections last 2 weeks and risk of degeneration of joint cartilage, will not proceed with another injection. Tramadol from pain management is not best choice either due to side effects and elevated creatinine. Continue use of voltaren gel, tylenol, and heating pads as needed for control of pain since these help the pain. Will refer to orthopedic surgery for further evaluation and potential need for surgery.

## 2020-06-28 LAB — COMPREHENSIVE METABOLIC PANEL
ALT: 6 IU/L (ref 0–32)
AST: 18 IU/L (ref 0–40)
Albumin/Globulin Ratio: 1.6 (ref 1.2–2.2)
Albumin: 4.6 g/dL (ref 3.7–4.7)
Alkaline Phosphatase: 51 IU/L (ref 44–121)
BUN/Creatinine Ratio: 17 (ref 12–28)
BUN: 21 mg/dL (ref 8–27)
Bilirubin Total: 0.4 mg/dL (ref 0.0–1.2)
CO2: 28 mmol/L (ref 20–29)
Calcium: 10.7 mg/dL — ABNORMAL HIGH (ref 8.7–10.3)
Chloride: 102 mmol/L (ref 96–106)
Creatinine, Ser: 1.27 mg/dL — ABNORMAL HIGH (ref 0.57–1.00)
GFR calc Af Amer: 47 mL/min/{1.73_m2} — ABNORMAL LOW (ref >59)
GFR calc non Af Amer: 41 mL/min/{1.73_m2} — ABNORMAL LOW (ref >59)
Globulin, Total: 2.8 g/dL (ref 1.5–4.5)
Glucose: 84 mg/dL (ref 65–99)
Potassium: 4.1 mmol/L (ref 3.5–5.2)
Sodium: 142 mmol/L (ref 134–144)
Total Protein: 7.4 g/dL (ref 6.0–8.5)

## 2020-07-09 ENCOUNTER — Other Ambulatory Visit: Payer: Self-pay | Admitting: Family Medicine

## 2020-07-10 ENCOUNTER — Ambulatory Visit (INDEPENDENT_AMBULATORY_CARE_PROVIDER_SITE_OTHER): Payer: PPO | Admitting: Orthopaedic Surgery

## 2020-07-10 ENCOUNTER — Other Ambulatory Visit: Payer: Self-pay

## 2020-07-10 ENCOUNTER — Other Ambulatory Visit: Payer: Self-pay | Admitting: Family Medicine

## 2020-07-10 ENCOUNTER — Encounter: Payer: Self-pay | Admitting: Orthopaedic Surgery

## 2020-07-10 VITALS — Ht 60.0 in | Wt 123.0 lb

## 2020-07-10 DIAGNOSIS — M1711 Unilateral primary osteoarthritis, right knee: Secondary | ICD-10-CM | POA: Diagnosis not present

## 2020-07-10 DIAGNOSIS — M1712 Unilateral primary osteoarthritis, left knee: Secondary | ICD-10-CM | POA: Diagnosis not present

## 2020-07-10 MED ORDER — CALCIUM CARBONATE-VITAMIN D 600-400 MG-UNIT PO TABS: 2.0000 | ORAL_TABLET | Freq: Every day | ORAL | 1 refills | Status: DC
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
## 2020-07-27 NOTE — Progress Notes (Signed)
    SUBJECTIVE:   CHIEF COMPLAINT / HPI: HTN & preoperative evaluation for knee replacement   Patient presents with her daughter for preoperative evaluation prior to knee replacement for OA. Reviewed patient's medical history with significant problems being HTN and CKD with report of intermittent chest pains. Preoperative score of 0.6% risk of perioperative mortality. She has no respiratory complaints or difficulties.   Essential HTN  Patient reports today for blood pressure follow up. Patient with elevated BP while in office and noted to have hx of white coat HTN. Home BP monitor reviewed with ranges from 103-159 systolic. She denies HA, blurry vision today. Patient has brought her home cuff to measure and compare with office cuff. On 11/24, we discontinued indapamide due to rising creatinine and elevated calcium.    PERTINENT  PMH / PSH:  Bilateral Knee OA  HTN   OBJECTIVE:   BP (!) 185/68   Pulse (!) 57   Ht 5' (1.524 m)   Wt 121 lb 6.4 oz (55.1 kg)   SpO2 100%   BMI 23.71 kg/m   General: elderly female appearing stated age in no acute distress Cardio: bradycardic Rhythm is regular. No murmurs or rubs.  Bilateral radial pulses palpable Pulm: Clear to auscultation bilaterally, no crackles, wheezing, or diminished breath sounds. Normal respiratory effort Extremities: No peripheral edema. Warm/ well perfused. Neuro: pt alert and oriented x4, normal strength, right eye prosthesis, left eye EOMI, sensation intake and normal gait   ASSESSMENT/PLAN:   Benign essential HTN BP elevated on multiple measurements spaced out by 5-10 minute intervals. Used patient's home wrist and arm cuffs with measurements within reasonable range of office automatic and manual cuffs. BP 207/106 with office automated, 185/68 office manual cuff, 210/108 with patient's arm cuff, 174/78 with patient's wrist cuff, 170/74 on my personal manual recheck. Patient continues to be without HA, SOB, chest pain while in  office but does reports some HA on right side of head that occur occasionally while home. Patient has no LE edema to raise suspicion for any CHF but may likely have structural changes due to long standing HTN.  - started hydralazine 10mg  TID  - patient will continue to keep BP log  - referral to cardiology  - echocardiogram ordered      Eulis Foster, MD [place]

## 2020-07-28 ENCOUNTER — Other Ambulatory Visit: Payer: Self-pay

## 2020-07-28 ENCOUNTER — Encounter: Payer: Self-pay | Admitting: Family Medicine

## 2020-07-28 ENCOUNTER — Ambulatory Visit (INDEPENDENT_AMBULATORY_CARE_PROVIDER_SITE_OTHER): Payer: PPO | Admitting: Family Medicine

## 2020-07-28 VITALS — BP 185/68 | HR 57 | Ht 60.0 in | Wt 121.4 lb

## 2020-07-28 DIAGNOSIS — I10 Essential (primary) hypertension: Secondary | ICD-10-CM | POA: Diagnosis not present

## 2020-07-28 DIAGNOSIS — R079 Chest pain, unspecified: Secondary | ICD-10-CM

## 2020-07-28 MED ORDER — HYDRALAZINE HCL 10 MG PO TABS: 10.0000 mg | ORAL_TABLET | Freq: Three times a day (TID) | ORAL | 0 refills | Status: DC

## 2020-07-28 NOTE — Assessment & Plan Note (Signed)
BP elevated on multiple measurements spaced out by 5-10 minute intervals. Used patient's home wrist and arm cuffs with measurements within reasonable range of office automatic and manual cuffs. BP 207/106 with office automated, 185/68 office manual cuff, 210/108 with patient's arm cuff, 174/78 with patient's wrist cuff, 170/74 on my personal manual recheck. Patient continues to be without HA, SOB, chest pain while in office but does reports some HA on right side of head that occur occasionally while home. Patient has no LE edema to raise suspicion for any CHF but may likely have structural changes due to long standing HTN.  - started hydralazine 10mg  TID  - patient will continue to keep BP log  - referral to cardiology  - echocardiogram ordered

## 2020-07-28 NOTE — Patient Instructions (Signed)
For your hypertension: I have prescribed a medication called hydralazine 10 mg.  Please take this medication 3 times daily and continue to measure your blood pressure twice daily at different times of the day.   Preoperative assessment: I have referred you to cardiology to further evaluate your intermittent chest pain consideration of your blood pressure.  Once you have been evaluated by cardiology, I recommend returning for blood work here prior to your knee surgery.  We will complete blood work to look at your electrolytes including your calcium as well as any changes in your blood counts.

## 2020-08-07 ENCOUNTER — Other Ambulatory Visit: Payer: Self-pay | Admitting: Family Medicine

## 2020-08-07 DIAGNOSIS — Z1231 Encounter for screening mammogram for malignant neoplasm of breast: Secondary | ICD-10-CM

## 2020-08-09 DIAGNOSIS — F0280 Dementia in other diseases classified elsewhere without behavioral disturbance: Secondary | ICD-10-CM

## 2020-08-09 HISTORY — DX: Dementia in other diseases classified elsewhere, unspecified severity, without behavioral disturbance, psychotic disturbance, mood disturbance, and anxiety: F02.80

## 2020-08-12 ENCOUNTER — Telehealth: Payer: Self-pay | Admitting: Licensed Clinical Social Worker

## 2020-08-12 NOTE — Chronic Care Management (AMB) (Signed)
    Clinical Social Work  Care Management  Unsuccessful Phone Outreach    08/12/2020 Name: Meredith Owens MRN: [id number] DOB: Dec 17, 1940  Meredith Owens is a 80 y.o. year old female who is a primary care patient of Simmons-Robinson, Makiera, MD . Patient is enrolled in a Managed Medicaid Health Plan: No  Returned phone call to [employer].  A HIPPA compliant phone message was left for the patient providing contact information and requesting a return call.   Plan: will wait for return call.   Review of patient status, including review of consultants reports, relevant laboratory and other test results, and collaboration with appropriate care team members and the patient's provider was performed as part of comprehensive patient evaluation and provision of care management services.    Sammuel Hines, LCSW Care Management & Coordination  [facility] / [facility]   [phone] 2:35 PM

## 2020-08-13 ENCOUNTER — Ambulatory Visit: Payer: PPO | Admitting: Licensed Clinical Social Worker

## 2020-08-13 DIAGNOSIS — F439 Reaction to severe stress, unspecified: Secondary | ICD-10-CM

## 2020-08-13 NOTE — Chronic Care Management (AMB) (Addendum)
Care Management   Clinical Social Work Follow UpNote  08/13/2020 Name: Meredith Owens MRN: [id number] DOB: Jan 09, 1941 Meredith Owens is a 80 y.o. year old female who sees Simmons-Robinson, Tawanna Cooler, MD for primary care.  Patient is enrolled in a Managed Medicaid plan: No.  Engaged with patient by telephone today in response to provider referral for social work case management and/or care coordination services. See care plan below for details during this encounter.  Follow up Plan: phone appointment in 1 week  Advanced Directives Status:See Care Plan for related entries     SDOH (Social Determinants of Health) assessments performed: No      Patient Care Plan: General Plan of Care (Adult)   Problem Identified: Long-Term Care Planning    Long-Range Goal: Effective Long-Term Care Planning /Over the next 45 days, the patient will have Advance Directive notarized and provide a copy to his provider   Start Date: 01/31/2020  Expected End Date: 11/06/2020  This Visit's Progress: Not on track  Priority: Low  Assessment, progress & current barriers:  Patient has not been able to make progress with completing her advance directive.  She has spoken to her adult children and continue to have difficulty with moving forward to complete the document. Interventions: Collaboration with PCP regarding development and update of comprehensive plan of care as evidenced by provider attestation and co-signature Inter-disciplinary care team collaboration (see longitudinal plan of care) Assessed understanding of Advance Directives A voluntary discussion about advanced care planning including importance of advanced directives, healthcare proxy and living will was discussed with the patient  Also spoke to both children in previous encounters  Mailed patient Advance Directive packet at previous encounter Patient Goals/Self-Care Activities : Over the next 45 days, patient will: complete Advance Directive packet,  Call  LCSW if you have questions  have advance directive notarized and provide a copy to provider office Follow Up Plan: SW will follow up with patient by phone over the next 30 days      Problem Identified: Coping Skills / stress    Goal: Coping Skills Enhanced/ Over the next 90 days, patient will work with SW to reduce or manage symptoms of stress   Start Date: 08/13/2020  Expected End Date: 11/06/2020  This Visit's Progress: On track  Priority: High  Assessment, progress and current barriers:  Patient is currently experiencing symptoms of stress which seems to be exacerbated by her medical concerns of having surgery for knee replacement, who will care for her and possibility of moving in with her daughter.  needs Support, Education, and Care Coordination in order to meet unmet need of managing her stress.   Clinical Interventions:   Assessed patient's needs, coping skills, current treatment, support system and barriers to care Acknowledge, normalize and validate difficulty of making life-long lifestyle changes.  Identify current effective and ineffective coping strategies. encourage journaling Other interventions include: Motivational Interviewing, Solution-Focused Strategies, Emotional/Supportive Counseling, and Radio producer with PCP regarding development and update of comprehensive plan of care as evidenced by provider attestation and co-signature Inter-disciplinary care team collaboration (see longitudinal plan of care) Patient Goals/Self-Care Activities: Over the next 7 days implement interventions discussed today to decreases symptoms of stress and increase knowledge and/or ability of: self-management skills Journal / Writing things down, etc Follow Up Plan:  SW will follow up with patient by phone over the next week         Outpatient Encounter Medications as of 08/13/2020  Medication Sig   acetaminophen (TYLENOL 8  HOUR) 650 MG CR tablet Take 1 tablet (650 mg total) by  mouth every 8 (eight) hours as needed for pain.   aspirin 81 MG tablet Take 81 mg by mouth daily.   Calcium Carbonate-Vitamin D 600-400 MG-UNIT tablet Take 2 tablets by mouth daily.   carvedilol (COREG) 12.5 MG tablet Take 1 tablet (12.5 mg total) by mouth 2 (two) times daily.   [employer] D3 50 MCG (2000 UT) CAPS TAKE 1 CAPSULE BY MOUTH EVERY DAY   cycloSPORINE (RESTASIS OP) Apply to eye.   diclofenac sodium (VOLTAREN) 1 % GEL Apply 4 g topically 4 (four) times daily.   dorzolamide-timolol (COSOPT) 22.3-6.8 MG/ML ophthalmic solution 1 drop 2 (two) times daily.   hydrALAZINE (APRESOLINE) 10 MG tablet Take 1 tablet (10 mg total) by mouth 3 (three) times daily.   indapamide (LOZOL) 1.25 MG tablet Take 1 tablet (1.25 mg total) by mouth daily.   losartan (COZAAR) 100 MG tablet Take 1 tablet (100 mg total) by mouth daily.   RESTASIS 0.05 % ophthalmic emulsion    ROCKLATAN 0.02-0.005 % SOLN INSTILL 1 DROP INTO LEFT EYE AT BEDTIME   rosuvastatin (CRESTOR) 10 MG tablet Take 1 tablet (10 mg total) by mouth every other day.   White Petrolatum-Mineral Oil (CVS EYE LUBRICANT OP) Apply to eye.   [DISCONTINUED] Calcium Carbonate-Vitamin D 600-400 MG-UNIT tablet Take 2 tablets by mouth daily.   No facility-administered encounter medications on file as of 08/13/2020.    Review of patient status, including review of consultants reports, relevant laboratory and other test results, and collaboration with appropriate care team members and the patient's provider was performed as part of comprehensive patient evaluation and provision of chronic care management services.       Information about Care Management services was shared with Ms.  Christell Constant today including:  Care Management services include personalized support from designated clinical staff supervised by her physician, including individualized plan of care and coordination with other care providers Remind patient of 24/7 contact phone numbers to provider's office for  assistance with urgent and routine care needs. Care Management services are voluntary and patient may stop at any time .   Patient agreed to services provided today and verbal consent obtained.     Sammuel Hines, LCSW Care Management & Coordination  [facility] / [facility]   [phone] 2:13 PM    I have reviewed this visit and agree with the documentation.  Ronnald Ramp, MD [facility], PGY-2

## 2020-08-13 NOTE — Patient Instructions (Signed)
  Ms. Theriault  it was nice speaking with you. Please call me directly [phone] if you have questions about the goals we discussed. Goals Addressed            This Visit's Progress   .  Acknowledge receipt of Advanced Directive package   Not on track     Patient Goals/Self-Care Activities :  . complete Advance Directive packet,  . Call LCSW if you have questions  . have advance directive notarized and provide a copy to provider office    . Coping Skills Enhanced   On track    Patient Goals/Self-Care Activities: Over the next 7 days . implement interventions discussed today to decreases symptoms of stress and increase knowledge and/or ability of: self-management skills . Journal / Writing things down, etc      Ms. Finstad received Care Management services today:  1. Care Management services include personalized support from designated clinical staff supervised by her physician, including individualized plan of care and coordination with other care providers 2. 24/7 contact [phone] for assistance for urgent and routine care needs. 3. Care Management are voluntary services and be declined at any time by calling the office.  Patient verbalizes understanding of instructions provided today.    Follow up plan: SW will follow up with patient by phone over the next one week  Soundra Pilon, LCSW

## 2020-08-19 ENCOUNTER — Other Ambulatory Visit: Payer: Self-pay | Admitting: Family Medicine

## 2020-08-20 ENCOUNTER — Ambulatory Visit: Payer: PPO | Admitting: Licensed Clinical Social Worker

## 2020-08-20 DIAGNOSIS — F439 Reaction to severe stress, unspecified: Secondary | ICD-10-CM

## 2020-08-20 NOTE — Patient Instructions (Addendum)
Visit Information  Patient Care Plan:   Problem Identified: Long-Term Care Planning   Long-Range Goal: Effective Long-Term Care Planning /Over the next 45 days, the patient will have Advance Directive notarized and provide a copy to his provider   Start Date: 01/31/2020  Expected End Date: 11/06/2020  Recent Progress: Not on track  Priority: Low  Current barriers:  . continues to have difficulty with moving forward to complete the document. . Does not have an advance directive Interventions: . Collaboration with PCP regarding development and update of comprehensive plan of care as evidenced by provider attestation and co-signature . Inter-disciplinary care team collaboration (see longitudinal plan of care) . Assessed understanding of Advance Directives . A voluntary discussion about advanced care planning including importance of advanced directives, healthcare proxy and living will was discussed with the patient  . Walked through document with patient . Also spoke to both children in previous encounters  . Mailed patient Advance Directive packet at previous encounter Patient Goals/Self-Care Activities : Over the next 30 days . complete Advance Directive packet,  . Call LCSW if you have questions  . have advance directive notarized  during office visit 09/08/20 and provide a copy to provider office Follow Up Plan: SW will follow up with patient by phone over the next 90 days    Problem Identified: Coping Skills / stress   Goal: Coping Skills Enhanced/ Over the next 90 days, patient will work with SW to reduce or manage symptoms of stress   Start Date: 08/13/2020  Expected End Date: 11/06/2020  Recent Progress: On track  Priority: High  Current barriers:   . currently experiencing symptoms of stress which seems to be exacerbated by her medical concerns of having surgery for knee replacement,  . who will care for her and possibility of moving in with her daughter.  . needs Support,  Education, and Care Coordination in order to meet unmet need of managing her stress.   Clinical Interventions:  . Assessed patient's needs, coping skills, support system and barriers to care  Acknowledge, normalize and validate difficulty of making life-long lifestyle changes.   Identify current effective and ineffective coping strategies. encourage journaling  Active listening; Motivational Interviewing, Solution-Focused Strategies, Emotional/Supportive Counseling, and Problem Solving  . Collaboration with PCP regarding development and update of comprehensive plan of care as evidenced by provider attestation and co-signature . Inter-disciplinary care team collaboration (see longitudinal plan of care) Patient Goals/Self-Care Activities: Over the next 90 days . implement interventions discussed today to decreases symptoms of stress and increase knowledge and/or ability of: self-management skills . Journal / Writing things down, etc . Talk about your feelings with your daughter Follow Up Plan:  SW will follow up with patient by phone over the next 90 days    The patient verbalized understanding of instructions,  and care plan provided today Meredith Owens

## 2020-08-20 NOTE — Chronic Care Management (AMB) (Signed)
Care Management Clinical Social Work Note  08/20/2020 Name: Meredith Owens MRN: [id number] DOB: Oct 19, 1940  Meredith Owens is a 80 y.o. year old female who is a primary care patient of Simmons-Robinson, Makiera, MD.  The Care Management team was consulted for assistance with chronic disease management and coordination needs.  Engaged with patient by telephone for follow up visit in response to provider referral for social work chronic care management and care coordination services  Consent to Services:  Ms. Olthoff was given information about Care Management services today including:  1. Care Management services includes personalized support from designated clinical staff supervised by her physician, including individualized plan of care and coordination with other care providers 2. 24/7 contact phone numbers for assistance for urgent and routine care needs. 3. The patient may stop case management services at any time by phone call to the office staff.  Patient agreed to services and consent obtained.   Assessment:Patient is making progress with managing her stress . She has been able to process her thought and has been able to move past the thoughts that were holding her back. See Care Plan activities for detailed interventions and patient self-care actives  Review of patient past medical history, allergies, medications, and health status, including review of relevant consultants reports was performed today as part of a comprehensive evaluation and provision of chronic care management and care coordination services.  SDOH (Social Determinants of Health) assessments and interventions performed:  no new needs identified. Advanced Directives Status: See Care Plan for related entries.  Care Plan  Allergies  Allergen Reactions  . Brimonidine Other (See Comments)    Red eyes  . Oxycodone Hcl     REACTION: Hives  . Prochlorperazine Edisylate     REACTION: anaphylaxis  . Latex Itching and Rash     Outpatient Encounter Medications as of 08/20/2020  Medication Sig  . acetaminophen (TYLENOL 8 HOUR) 650 MG CR tablet Take 1 tablet (650 mg total) by mouth every 8 (eight) hours as needed for pain.  Marland Kitchen aspirin 81 MG tablet Take 81 mg by mouth daily.  . Calcium Carbonate-Vitamin D 600-400 MG-UNIT tablet Take 2 tablets by mouth daily.  . carvedilol (COREG) 12.5 MG tablet Take 1 tablet (12.5 mg total) by mouth 2 (two) times daily.  . CVS D3 50 MCG (2000 UT) CAPS TAKE 1 CAPSULE BY MOUTH EVERY DAY  . cycloSPORINE (RESTASIS OP) Apply to eye.  . diclofenac sodium (VOLTAREN) 1 % GEL Apply 4 g topically 4 (four) times daily.  . dorzolamide-timolol (COSOPT) 22.3-6.8 MG/ML ophthalmic solution 1 drop 2 (two) times daily.  . hydrALAZINE (APRESOLINE) 10 MG tablet TAKE 1 TABLET BY MOUTH THREE TIMES A DAY  . indapamide (LOZOL) 1.25 MG tablet Take 1 tablet (1.25 mg total) by mouth daily.  Marland Kitchen losartan (COZAAR) 100 MG tablet Take 1 tablet (100 mg total) by mouth daily.  . RESTASIS 0.05 % ophthalmic emulsion   . ROCKLATAN 0.02-0.005 % SOLN INSTILL 1 DROP INTO LEFT EYE AT BEDTIME  . rosuvastatin (CRESTOR) 10 MG tablet Take 1 tablet (10 mg total) by mouth every other day.  Dema Severin Petrolatum-Mineral Oil (CVS EYE LUBRICANT OP) Apply to eye.  . [DISCONTINUED] Calcium Carbonate-Vitamin D 600-400 MG-UNIT tablet Take 2 tablets by mouth daily.   No facility-administered encounter medications on file as of 08/20/2020.    Patient Active Problem List   Diagnosis Date Noted  . Primary osteoarthritis of left knee 07/10/2020  . Primary osteoarthritis of right knee 07/10/2020  .  OA (osteoarthritis) of knee 02/19/2020  . Osteopenia 01/16/2020  . Goiter 07/20/2019  . Shoulder pain 05/03/2019  . Hallux valgus (acquired), left foot 10/20/2018  . Ptosis of left eyelid 10/20/2018  . Vitamin D insufficiency 08/18/2018  . Mild cognitive impairment with memory loss 03/13/2018  . Visual impairment 03/13/2018  . Skin  abnormalities 09/21/2015  . Chronic kidney disease, stage 3 ([place]) 02/17/2015  . Glaucoma, left eye 01/22/2015  . Prediabetes 11/20/2013  . Healthcare maintenance 05/30/2012  . Primary osteoarthritis of both knees 06/21/2011  . Vitreous detachment 11/02/2010  . HYPERCHOLESTEROLEMIA 11/17/2006  . DEPRESSIVE DISORDER, NOS 10/06/2006  . Benign essential HTN 10/06/2006    Conditions to be addressed/monitored: managing stress;   Care Plan : General Plan of Care (Adult)  Updates made by Maurine Cane, LCSW since 08/20/2020 12:00 AM  Problem: Long-Term Care Planning   Long-Range Goal: Effective Long-Term Care Planning /Over the next 45 days, the patient will have Advance Directive notarized and provide a copy to his provider   Start Date: 01/31/2020  Expected End Date: 11/06/2020  Recent Progress: Not on track  Priority: Low  Current barriers:  . continues to have difficulty with moving forward to complete the document. . Does not have an advance directive Interventions: . Collaboration with PCP regarding development and update of comprehensive plan of care as evidenced by provider attestation and co-signature . Inter-disciplinary care team collaboration (see longitudinal plan of care) . Assessed understanding of Advance Directives . A voluntary discussion about advanced care planning including importance of advanced directives, healthcare proxy and living will was discussed with the patient  . Walked through document with patient . Also spoke to both children in previous encounters  . Mailed patient Advance Directive packet at previous encounter Patient Goals/Self-Care Activities : Over the next 30 days . complete Advance Directive packet,  . Call LCSW if you have questions  . have advance directive notarized  during office visit 09/08/20 and provide a copy to provider office Follow Up Plan: SW will follow up with patient by phone over the next 90 days    Problem: Coping Skills /  stress   Goal: Coping Skills Enhanced/ Over the next 90 days, patient will work with SW to reduce or manage symptoms of stress   Start Date: 08/13/2020  Expected End Date: 11/06/2020  Recent Progress: On track  Priority: High  Current barriers:   . currently experiencing symptoms of stress which seems to be exacerbated by her medical concerns of having surgery for knee replacement,  . who will care for her and possibility of moving in with her daughter.  . needs Support, Education, and Care Coordination in order to meet unmet need of managing her stress.   Clinical Interventions:  . Assessed patient's needs, coping skills, support system and barriers to care  Acknowledge, normalize and validate difficulty of making life-long lifestyle changes.   Identify current effective and ineffective coping strategies. encourage journaling  Active listening; Motivational Interviewing, Solution-Focused Strategies, Emotional/Supportive Counseling, and Problem Solving  . Collaboration with PCP regarding development and update of comprehensive plan of care as evidenced by provider attestation and co-signature . Inter-disciplinary care team collaboration (see longitudinal plan of care) Patient Goals/Self-Care Activities: Over the next 90 days . implement interventions discussed today to decreases symptoms of stress and increase knowledge and/or ability of: self-management skills . Journal / Writing things down, etc . Talk about your feelings with your daughter Follow Up Plan:  SW will follow up with  patient by phone over the next 90 days     Casimer Lanius, [place] / [place]   [phone] 2:55 PM

## 2020-08-27 ENCOUNTER — Encounter: Payer: Self-pay | Admitting: Cardiology

## 2020-08-27 ENCOUNTER — Ambulatory Visit: Payer: PPO | Admitting: Cardiology

## 2020-08-27 ENCOUNTER — Other Ambulatory Visit: Payer: Self-pay

## 2020-08-27 ENCOUNTER — Telehealth (HOSPITAL_COMMUNITY): Payer: Self-pay | Admitting: *Deleted

## 2020-08-27 VITALS — BP 140/70 | HR 71 | Ht 60.0 in | Wt 119.0 lb

## 2020-08-27 DIAGNOSIS — I259 Chronic ischemic heart disease, unspecified: Secondary | ICD-10-CM

## 2020-08-27 DIAGNOSIS — I10 Essential (primary) hypertension: Secondary | ICD-10-CM

## 2020-08-27 DIAGNOSIS — E7800 Pure hypercholesterolemia, unspecified: Secondary | ICD-10-CM

## 2020-08-27 NOTE — Telephone Encounter (Signed)
Left message on voicemail in reference to upcoming appointment scheduled for 09/01/20. Phone number given for a call back so details instructions can be given.  Kirstie Peri

## 2020-08-27 NOTE — Patient Instructions (Signed)
Medication Instructions:  *If you need a refill on your cardiac medications before your next appointment, please call your pharmacy*   Lab Work: none If you have labs (blood work) drawn today and your tests are completely normal, you will receive your results only by: Marland Kitchen MyChart Message (if you have MyChart) OR . A paper copy in the mail If you have any lab test that is abnormal or we need to change your treatment, we will call you to review the results.   Testing/Procedures: Your physician has requested that you have a lexiscan myoview. For further information please visit [email or web address]. Please follow instruction sheet, as given.     Follow-Up: At [facility], you and your health needs are our priority.  As part of our continuing mission to provide you with exceptional heart care, we have created designated Provider Care Teams.  These Care Teams include your primary Cardiologist (physician) and Advanced Practice Providers (APPs -  Physician Assistants and Nurse Practitioners) who all work together to provide you with the care you need, when you need it.  We recommend signing up for the patient portal called "MyChart".  Sign up information is provided on this After Visit Summary.  MyChart is used to connect with patients for Virtual Visits (Telemedicine).  Patients are able to view lab/test results, encounter notes, upcoming appointments, etc.  Non-urgent messages can be sent to your provider as well.   To learn more about what you can do with MyChart, go to [email or web address].    Your next appointment:  As Needed with Dr. Marlou Porch
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
## 2020-08-28 ENCOUNTER — Other Ambulatory Visit (HOSPITAL_COMMUNITY): Payer: PPO

## 2020-08-28 ENCOUNTER — Telehealth (HOSPITAL_COMMUNITY): Payer: Self-pay | Admitting: *Deleted

## 2020-08-28 NOTE — Telephone Encounter (Signed)
Patient given detailed instructions per Myocardial Perfusion Study Information Sheet for the test on 09/01/2020 at 0800. Patient notified to arrive 15 minutes early and that it is imperative to arrive on time for appointment to keep from having the test rescheduled.  If you need to cancel or reschedule your appointment, please call the office within 24 hours of your appointment. . Patient verbalized understanding.Hasspacher, Ranae Palms No mychart available

## 2020-09-01 ENCOUNTER — Other Ambulatory Visit: Payer: Self-pay

## 2020-09-01 ENCOUNTER — Ambulatory Visit: Payer: PPO | Admitting: Family Medicine

## 2020-09-01 ENCOUNTER — Ambulatory Visit (HOSPITAL_BASED_OUTPATIENT_CLINIC_OR_DEPARTMENT_OTHER): Payer: PPO

## 2020-09-01 ENCOUNTER — Ambulatory Visit (HOSPITAL_COMMUNITY): Payer: PPO | Attending: Cardiovascular Disease

## 2020-09-01 ENCOUNTER — Ambulatory Visit (HOSPITAL_COMMUNITY): Payer: PPO

## 2020-09-01 DIAGNOSIS — R079 Chest pain, unspecified: Secondary | ICD-10-CM | POA: Diagnosis not present

## 2020-09-01 DIAGNOSIS — I259 Chronic ischemic heart disease, unspecified: Secondary | ICD-10-CM | POA: Diagnosis not present

## 2020-09-01 LAB — MYOCARDIAL PERFUSION IMAGING
LV dias vol: 44 mL (ref 46–106)
LV sys vol: 13 mL
Peak HR: 86 {beats}/min
Rest HR: 54 {beats}/min
SDS: 1
SRS: 0
SSS: 1
TID: 0.72

## 2020-09-01 LAB — ECHOCARDIOGRAM COMPLETE
Area-P 1/2: 4.55 cm2
Height: 60 in
S' Lateral: 2.3 cm
Weight: 1904 oz

## 2020-09-01 MED ORDER — TECHNETIUM TC 99M TETROFOSMIN IV KIT
10.1000 | PACK | Freq: Once | INTRAVENOUS | Status: AC | PRN
  Administered 2020-09-01: 10.1 via INTRAVENOUS
  Filled 2020-09-01: qty 11

## 2020-09-01 MED ORDER — REGADENOSON 0.4 MG/5ML IV SOLN
0.4000 mg | Freq: Once | INTRAVENOUS | Status: AC
  Administered 2020-09-01: 0.4 mg via INTRAVENOUS

## 2020-09-01 MED ORDER — TECHNETIUM TC 99M TETROFOSMIN IV KIT
29.7000 | PACK | Freq: Once | INTRAVENOUS | Status: AC | PRN
  Administered 2020-09-01: 29.7 via INTRAVENOUS
  Filled 2020-09-01: qty 30

## 2020-09-03 ENCOUNTER — Other Ambulatory Visit: Payer: Self-pay | Admitting: Family Medicine

## 2020-09-08 ENCOUNTER — Ambulatory Visit (INDEPENDENT_AMBULATORY_CARE_PROVIDER_SITE_OTHER): Payer: PPO | Admitting: Family Medicine

## 2020-09-08 ENCOUNTER — Encounter: Payer: Self-pay | Admitting: Family Medicine

## 2020-09-08 ENCOUNTER — Other Ambulatory Visit: Payer: Self-pay

## 2020-09-08 VITALS — BP 165/75 | HR 59 | Wt 119.8 lb

## 2020-09-08 DIAGNOSIS — I10 Essential (primary) hypertension: Secondary | ICD-10-CM | POA: Diagnosis not present

## 2020-09-08 DIAGNOSIS — E119 Type 2 diabetes mellitus without complications: Secondary | ICD-10-CM | POA: Diagnosis not present

## 2020-09-08 MED ORDER — TRAMADOL HCL 50 MG PO TABS: 50.0000 mg | ORAL_TABLET | Freq: Three times a day (TID) | ORAL | 0 refills | Status: AC | PRN

## 2020-09-08 NOTE — Patient Instructions (Signed)
Please continue to take your blood pressure medications as prescribed. I am happy to hear that you knee pain has decreased some. I will prescribe Tramadol for you to take on days that your pain is more severe, as needed.   Please follow up with me in 2 months for check on your blood pressure and to see how your knee pain is doing.   Please continue to take your blood pressure 3-4 times per week and keep record.   Please bring your advanced directive in the next few weeks to have it notarized if you would like to have it done prior to your next appointment.   Dr. Quentin Cornwall

## 2020-09-08 NOTE — Progress Notes (Signed)
    SUBJECTIVE:   CHIEF COMPLAINT / HPI: Hypertension follow-up  Hypertension Patient is presented to follow-up for hypertension.  Her current regimen includes Coreg 12.5 twice daily, hydralazine 10 mg 3 times daily and losartan 100 mg daily.  Patient with elevated blood pressure in office consistent with whitecoat hypertension.  She has been evaluated by cardiology who is in agreement with blood pressure plan.  Review of echocardiogram shows some thickening of aortic valve, normal EF 55-60%. No ischemia on stress test. Patient continues to have no HA, blurry vision. She denies presence of chest pain/discomfort at this time.    PERTINENT  PMH / PSH: Hypertension, whitecoat  OBJECTIVE:   BP (!) 165/75   Pulse (!) 59   Wt 119 lb 12.8 oz (54.3 kg)   SpO2 100%   BMI 23.40 kg/m   General: obese female  appearing stated age in no acute distress HEENT: MMM, no oral lesions noted,Neck non-tender without lymphadenopathy Cardio: Normal S1 and S2, no S3 or S4. Rhythm is regular. No murmurs or rubs.  Bilateral radial pulses palpable Pulm: Clear to auscultation bilaterally, no crackles, wheezing, or diminished breath sounds. Normal respiratory effort Abdomen: Bowel sounds normal. Abdomen soft and non-tender.  Extremities: No peripheral edema. Warm & well perfused.   ASSESSMENT/PLAN:   Elevated blood pressure reading in office with white coat syndrome, with diagnosis of hypertension Patient to continue on hydralazine 3 times daily she reports adherence with this regimen. We will also continue losartan and Coreg as prescribed. Patient will continue to monitor blood pressure at home and follow-up with me in 2 weeks. Patient is okay to pursue knee replacement surgery from cardiology and PCP standpoint. Blood pressure 165/75 today with no red flag symptoms and normal neurological exam.     Eulis Foster, MD [place]

## 2020-09-09 NOTE — Assessment & Plan Note (Signed)
Patient to continue on hydralazine 3 times daily she reports adherence with this regimen. We will also continue losartan and Coreg as prescribed. Patient will continue to monitor blood pressure at home and follow-up with me in 2 weeks. Patient is okay to pursue knee replacement surgery from cardiology and PCP standpoint. Blood pressure 165/75 today with no red flag symptoms and normal neurological exam.

## 2020-09-25 ENCOUNTER — Other Ambulatory Visit: Payer: Self-pay | Admitting: Family Medicine

## 2020-09-26 NOTE — Telephone Encounter (Signed)
Valsartan 80mg  ordered as replacement for losartan 100mg . Losartan on back order at this time.

## 2020-10-01 DIAGNOSIS — H02422 Myogenic ptosis of left eyelid: Secondary | ICD-10-CM | POA: Diagnosis not present

## 2020-10-01 DIAGNOSIS — Z970 Presence of artificial eye: Secondary | ICD-10-CM | POA: Diagnosis not present

## 2020-10-01 DIAGNOSIS — H43812 Vitreous degeneration, left eye: Secondary | ICD-10-CM | POA: Diagnosis not present

## 2020-10-01 DIAGNOSIS — H401222 Low-tension glaucoma, left eye, moderate stage: Secondary | ICD-10-CM | POA: Diagnosis not present

## 2020-10-01 DIAGNOSIS — H16222 Keratoconjunctivitis sicca, not specified as Sjogren's, left eye: Secondary | ICD-10-CM | POA: Diagnosis not present

## 2020-10-01 DIAGNOSIS — Z961 Presence of intraocular lens: Secondary | ICD-10-CM | POA: Diagnosis not present

## 2020-10-06 ENCOUNTER — Other Ambulatory Visit: Payer: Self-pay | Admitting: Family Medicine

## 2020-10-08 ENCOUNTER — Ambulatory Visit: Payer: PPO | Admitting: Licensed Clinical Social Worker

## 2020-10-08 DIAGNOSIS — Z7189 Other specified counseling: Secondary | ICD-10-CM

## 2020-10-08 NOTE — Patient Instructions (Signed)
  Meredith Owens  it was nice speaking with you. Please call me directly [phone] if you have questions about the goals we discussed. Goals Addressed            This Visit's Progress   .  Acknowledge receipt of Advanced Directive package   On track     Timeframe:  Long-Range Goal Priority:  Medium Start Date:                             Expected End Date:   09/08/20                     Patient Goals/Self-Care Activities :  . Call LCSW if you have questions  . have advance directive notarized and provide a copy to provider office    . Coping Skills Enhanced   On track    Timeframe:  Long-Range Goal Priority:  High Start Date:  08/13/20                           Expected End Date:  11/06/20                      Patient Goals/Self-Care Activities:  . implement interventions discussed today to decreases symptoms of stress and increase knowledge and/or ability of: self-management skills . Journal / Writing things down, etc . Talk about your feelings with your daughter     Meredith Owens received Care Coordination services today:  1. Care Coordination services include personalized support from designated clinical staff supervised by her physician, including individualized plan of care and coordination with other care providers 2. 24/7 contact [phone] for assistance for urgent and routine care needs. 3. Care Coordination are voluntary services and be declined at any time by calling the office.  Patient verbalizes understanding of instructions provided today.    Follow up plan: Appointment scheduled for SW follow up with client by phone on: 01/09/21  Maurine Cane, LCSW

## 2020-10-08 NOTE — Chronic Care Management (AMB) (Addendum)
Care Management Clinical Social Work Note  10/08/2020 Name: Meredith Owens MRN: [id number] DOB: 1941-01-19  Meredith Owens is a 80 y.o. year old female who is a primary care patient of Simmons-Robinson, Makiera, MD.  The Care Management team was consulted for assistance with chronic disease management and coordination needs.  Engaged with patient face to face for follow up visit in response to provider referral for social work chronic care management and care coordination services  Consent to Services:  Patient agreed to services and consent obtained.   Assessment: Patient is making progress with care plan goals. . See Care Plan below for interventions and patient self-care actives. Recommendation: Patient may benefit from, and is in agreement to talk to PCP about her Code status.  She would like to change to DNR.  Follow up Plan: Patient will discuss code status with PCP; LCSW will inform PCP that patient would like to have this conversation.   Patient would like continued follow-up.  CCM LCSW will f/u with patient in 90 days. Patient will call office if needed prior to next encounter   Review of patient past medical history, allergies, medications, and health status, including review of relevant consultants reports was performed today as part of a comprehensive evaluation and provision of chronic care management and care coordination services.  SDOH (Social Determinants of Health) assessments and interventions performed:     Advanced Directives Status: See Care Plan for related entries.  Care Plan  Allergies  Allergen Reactions   Brimonidine Other (See Comments)    Red eyes   Oxycodone Hcl     REACTION: Hives   Prochlorperazine Edisylate     REACTION: anaphylaxis   Latex Itching and Rash    Outpatient Encounter Medications as of 10/08/2020  Medication Sig   CALCIUM + VITAMIN D3 600-10 MG-MCG TABS TAKE 2 TABLETS BY MOUTH EVERY DAY   acetaminophen (TYLENOL 8 HOUR) 650 MG CR tablet Take  1 tablet (650 mg total) by mouth every 8 (eight) hours as needed for pain.   aspirin 81 MG tablet Take 81 mg by mouth daily.   carvedilol (COREG) 12.5 MG tablet Take 1 tablet (12.5 mg total) by mouth 2 (two) times daily.   CVS D3 50 MCG (2000 UT) CAPS TAKE 1 CAPSULE BY MOUTH EVERY DAY   cycloSPORINE (RESTASIS OP) Apply to eye.   diclofenac sodium (VOLTAREN) 1 % GEL Apply 4 g topically 4 (four) times daily.   dorzolamide-timolol (COSOPT) 22.3-6.8 MG/ML ophthalmic solution 1 drop 2 (two) times daily.   hydrALAZINE (APRESOLINE) 10 MG tablet TAKE 1 TABLET BY MOUTH THREE TIMES A DAY   RESTASIS 0.05 % ophthalmic emulsion    ROCKLATAN 0.02-0.005 % SOLN INSTILL 1 DROP INTO LEFT EYE AT BEDTIME   rosuvastatin (CRESTOR) 10 MG tablet Take 1 tablet (10 mg total) by mouth every other day.   valsartan (DIOVAN) 80 MG tablet Take 1 tablet (80 mg total) by mouth daily.   White Petrolatum-Mineral Oil (CVS EYE LUBRICANT OP) Apply to eye.   [DISCONTINUED] Calcium Carbonate-Vitamin D 600-400 MG-UNIT tablet Take 2 tablets by mouth daily.   No facility-administered encounter medications on file as of 10/08/2020.    Patient Active Problem List   Diagnosis Date Noted   Primary osteoarthritis of left knee 07/10/2020   Primary osteoarthritis of right knee 07/10/2020   OA (osteoarthritis) of knee 02/19/2020   Osteopenia 01/16/2020   Goiter 07/20/2019   Shoulder pain 05/03/2019   Hallux valgus (acquired), left foot 10/20/2018  Ptosis of left eyelid 10/20/2018   Vitamin D insufficiency 08/18/2018   Mild cognitive impairment with memory loss 03/13/2018   Visual impairment 03/13/2018   Skin abnormalities 09/21/2015   Chronic kidney disease, stage 3 ([place]) 02/17/2015   Glaucoma, left eye 01/22/2015   Prediabetes 11/20/2013   Healthcare maintenance 05/30/2012   Primary osteoarthritis of both knees 06/21/2011   Vitreous detachment 11/02/2010   HYPERCHOLESTEROLEMIA 11/17/2006   DEPRESSIVE DISORDER, NOS 10/06/2006    Elevated blood pressure reading in office with white coat syndrome, with diagnosis of hypertension 10/06/2006    Conditions to be addressed/monitored: Advance Directive; Stress  Care Plan : Social Work  Updates made by Maurine Cane, LCSW since 10/08/2020 12:00 AM   Problem: Long-Term Care Planning    Long-Range Goal: Effective Long-Term Care Planning /Over the next 45 days, the patient will have Advance Directive notarized and provide a copy to his provider   Start Date: 01/31/2020  Expected End Date: 11/06/2020  This Visit's Progress: On track  Recent Progress: Not on track  Priority: Low  Current barriers:  Patient went to office to have advance directive notarized but document was incomplete continues to have difficulty with moving forward to complete the document. Does not have an advance directive Interventions: Collaboration with PCP regarding development and update of comprehensive plan of care as evidenced by provider attestation and co-signature Inter-disciplinary care team collaboration (see longitudinal plan of care) Assessed understanding of Advance Directives A voluntary discussion about advanced care planning including importance of advanced directives, healthcare proxy and living will was discussed with the patient  Spent time walking through and reviewing document with patient again Also spoke to both children in previous encounters  Patient Goals/Self-Care Activities : Over the next 30 days Call LCSW if you have questions  have advance directive notarized and provide a copy to provider office Follow Up Plan: SW will follow up with patient by phone over the next 90 days     Problem: Coping Skills / stress    Goal: Coping Skills Enhanced/ Over the next 90 days, patient will work with SW to reduce or manage symptoms of stress   Start Date: 08/13/2020  Expected End Date: 11/06/2020  Recent Progress: On track  Priority: High  Current barriers:   Currently  experiencing symptoms of stress which seems to be exacerbated by her medical concerns of having surgery for knee replacement,  Who will care for her and possibility of moving in with her daughter.  Needs Support, Education, and Care Coordination in order to meet unmet need of managing her stress.   Clinical Interventions:  Assessed patient's needs, coping skills, support system and barriers to care Acknowledge, normalize and validate difficulty of making life-long lifestyle changes.  Identify current effective and ineffective coping strategies. encourage journaling Active listening; Motivational Interviewing, Solution-Focused Strategies, Emotional/Supportive Counseling, and Programmer, multimedia with PCP regarding development and update of comprehensive plan of care as evidenced by provider attestation and co-signature Inter-disciplinary care team collaboration (see longitudinal plan of care) Patient Goals/Self-Care Activities: Over the next 90 days implement interventions discussed today to decreases symptoms of stress and increase knowledge and/or ability of: self-management skills Journal / Writing things down, etc Talk about your feelings with your daughter Follow Up Plan:  SW will follow up with patient by phone over the next 90 days     Casimer Lanius, [place] / [place]   [phone] 10:13 AM  I have reviewed this visit and agree with the documentation.  Eulis Foster, MD [place], PGY-2 [phone]

## 2020-10-16 ENCOUNTER — Other Ambulatory Visit: Payer: Self-pay

## 2020-10-16 ENCOUNTER — Ambulatory Visit
Admission: RE | Admit: 2020-10-16 | Discharge: 2020-10-16 | Disposition: A | Discharge status: Home / Self Care | Payer: PPO | Source: Ambulatory Visit | Attending: Family Medicine | Admitting: Family Medicine

## 2020-10-16 DIAGNOSIS — Z1231 Encounter for screening mammogram for malignant neoplasm of breast: Secondary | ICD-10-CM

## 2020-10-24 ENCOUNTER — Telehealth: Payer: Self-pay | Admitting: Family Medicine

## 2020-10-24 NOTE — Telephone Encounter (Signed)
Contacted patient to inform her that DNR paperwork has been completed and placed up front for her to pick up at her convenience.   Eulis Foster, MD [facility], PGY-2 [phone]

## 2020-11-11 ENCOUNTER — Telehealth: Payer: PPO

## 2020-12-08 ENCOUNTER — Ambulatory Visit (INDEPENDENT_AMBULATORY_CARE_PROVIDER_SITE_OTHER): Payer: PPO | Admitting: Family Medicine

## 2020-12-08 ENCOUNTER — Encounter: Payer: Self-pay | Admitting: Family Medicine

## 2020-12-08 ENCOUNTER — Other Ambulatory Visit: Payer: Self-pay

## 2020-12-08 VITALS — BP 156/56 | HR 64 | Ht 60.0 in | Wt 117.0 lb

## 2020-12-08 DIAGNOSIS — E049 Nontoxic goiter, unspecified: Secondary | ICD-10-CM

## 2020-12-08 DIAGNOSIS — Z1159 Encounter for screening for other viral diseases: Secondary | ICD-10-CM

## 2020-12-08 DIAGNOSIS — I10 Essential (primary) hypertension: Secondary | ICD-10-CM

## 2020-12-08 DIAGNOSIS — Z0000 Encounter for general adult medical examination without abnormal findings: Secondary | ICD-10-CM

## 2020-12-08 NOTE — Progress Notes (Signed)
    SUBJECTIVE:   CHIEF COMPLAINT / HPI: HTN f/u   Meredith Owens presents today for follow up for HTN. Her current regimen includes carvedilol, valsartan and hydralazine. She reports that she has been using her wrist cough for blood pressure measurements at home. She continues to be asymptomatic during elevated BP episodes. She continues to have normotensive measurements while home.   MDD Patient reports that her mood has been ok. She declines offer for pharmacologic management of intermittently depressed mood. Patient attributes this to her feeling lonely at times as she lives alone. She is looking forward to moving in with her daughter, Meredith Owens, once the new home is completed. Patient denies any SI/HI. She reports some trouble with sleep at times and states that her appetite is good, she just does not enjoy cooking large meals at home since she is the only one there. Patient believes she would benefit from speaking with a therapist to help with loneliness.   HM  Patient is recommend for Hep C screening today.   PERTINENT  PMH / PSH:  MDD  Osteoarthritis of Bilateral Knees  Mild Cognitive Decline   OBJECTIVE:   BP (!) 156/56   Pulse 64   Ht 5' (1.524 m)   Wt 117 lb (53.1 kg)   SpO2 100%   BMI 22.85 kg/m   General: female appearing stated age in no acute distress Cardio: Normal S1 and S2, no S3 or S4. Rhythm is regular. No murmurs or rubs.  Bilateral radial pulses palpable Pulm: Clear to auscultation bilaterally, no crackles, wheezing, or diminished breath sounds. Normal respiratory effort, stable on RA Abdomen: Bowel sounds normal. Abdomen soft and non-tender.  Extremities: No peripheral edema. Warm/ well perfused.  Psych: stated mood "doing ok", congruent affect, no psychomotor slowing, denies SI/HI, no objective findings concerning for AV hallucinations   ASSESSMENT/PLAN:   Elevated blood pressure reading in office with white coat syndrome, with diagnosis of hypertension BP  improved from prior visits. - continue coreg, hydralazine, amlodipine  - BMP today    DEPRESSIVE DISORDER, NOS Patient with some depressed mood often associated with being alone in her apartment. No SI. Patient open to discussing with therapist.  - given handout with therapist resources  - patient declines medication at this time  - will f/u in 1 month  - TSH today   Healthcare maintenance Will check Hep C screening today      Meredith Foster, MD [place]

## 2020-12-08 NOTE — Patient Instructions (Signed)
Thank you for choosing [facility] for your health care today.  Very happy to see that your blood pressure has improved.  I recommend using the attached list to establish care with a therapist to help talk and help with your mood.  If you would like to discuss starting a medication at your next appointment, we can do so.  Please follow-up with me in the next month.  We will check your metabolic panel, thyroid levels, and hepatitis C screening.  Psychiatry Resource List (Adults and Children) Most of these providers will take Medicaid. please consult your insurance for a complete and updated list of available providers. When calling to make an appointment have your insurance information available to confirm you are covered.   [facility]:Psychiatry and Counseling [address] [place], [place] [address] [phone]  [facility]  [place], [place]:   [facility]: [address]     [phone]   Linna Hoff: [place]. [place],        [phone] [place]: [place],    [place]: [phone] [address],                   [phone] Children: [place] [address]         [phone]  [place] (virtual only) [phone]    [facility]  (Psychiatry only; Adults /children 12 and over, will take Medicaid)  [place], [place], [place] [address]       [phone]   [facility] (Psychiatry & counseling ; adults & children ; will take Medicaid [address]  [place] [address]  Go on-line to complete referral ( [email or web address] [phone]    (Spanish speaking therapists)  [facility]  Psychiatry & counseling; Adults and children;  Call  Registration prior to scheduling an appointment [phone]    [place], [place] [address]    [phone]  [facility] (Psychiatry & counseling; adults & children; Medicare no Medicaid)  [place],   [address]      [phone]    [facility] (up to age 5)  Psychiatry & counseling ,will take Medicaid, must do counseling to receive psychiatry services  [address] [place] [address]        ([facility] (Psychiatry & counseling; adults & children; will take Medicaid) Will need a referral from provider [address],  [place], [place]  [phone]   [facility] --- Walk-In Mon-Friday 8am-3pm ( will take Medicaid, Psychiatry, Adults & children,  [address] [place], [place]   [phone]   [facility]--, Walk-in M-F 8am-12pm and 1pm -3pm   (Counseling, Psychiatry, will take Medicaid, adults & children)  [address], [place], [place]  [phone]

## 2020-12-09 LAB — BASIC METABOLIC PANEL
BUN/Creatinine Ratio: 18 (ref 12–28)
BUN: 19 mg/dL (ref 8–27)
CO2: 25 mmol/L (ref 20–29)
Calcium: 10.5 mg/dL — ABNORMAL HIGH (ref 8.7–10.3)
Chloride: 106 mmol/L (ref 96–106)
Creatinine, Ser: 1.08 mg/dL — ABNORMAL HIGH (ref 0.57–1.00)
Glucose: 80 mg/dL (ref 65–99)
Potassium: 4.3 mmol/L (ref 3.5–5.2)
Sodium: 143 mmol/L (ref 134–144)
eGFR: 52 mL/min/{1.73_m2} — ABNORMAL LOW (ref >59)

## 2020-12-09 LAB — TSH: TSH: 1.66 u[IU]/mL (ref 0.450–4.500)

## 2020-12-09 LAB — HEPATITIS C ANTIBODY: Hep C Virus Ab: 0.2 s/co ratio (ref 0.0–0.9)

## 2020-12-10 NOTE — Assessment & Plan Note (Signed)
Will check Hep C screening today

## 2020-12-10 NOTE — Assessment & Plan Note (Signed)
Patient with some depressed mood often associated with being alone in her apartment. No SI. Patient open to discussing with therapist.  - given handout with therapist resources  - patient declines medication at this time  - will f/u in 1 month  - TSH today

## 2020-12-10 NOTE — Assessment & Plan Note (Signed)
BP improved from prior visits. - continue coreg, hydralazine, amlodipine  - BMP today

## 2020-12-17 ENCOUNTER — Other Ambulatory Visit: Payer: Self-pay | Admitting: Family Medicine

## 2020-12-22 ENCOUNTER — Other Ambulatory Visit: Payer: PPO

## 2020-12-22 ENCOUNTER — Other Ambulatory Visit: Payer: Self-pay | Admitting: Family Medicine

## 2020-12-22 ENCOUNTER — Other Ambulatory Visit: Payer: Self-pay

## 2020-12-23 LAB — PTH, INTACT AND CALCIUM
Calcium: 10.3 mg/dL (ref 8.7–10.3)
PTH: 54 pg/mL (ref 15–65)

## 2020-12-25 ENCOUNTER — Encounter: Payer: Self-pay | Admitting: Family Medicine

## 2021-01-09 ENCOUNTER — Telehealth: Payer: Self-pay | Admitting: Licensed Clinical Social Worker

## 2021-01-09 ENCOUNTER — Ambulatory Visit: Payer: PPO | Admitting: Licensed Clinical Social Worker

## 2021-01-09 DIAGNOSIS — Z719 Counseling, unspecified: Secondary | ICD-10-CM

## 2021-01-09 NOTE — Chronic Care Management (AMB) (Signed)
    Clinical Social Work  Care Management   Phone Outreach    01/09/2021 Name: Meredith Owens MRN: [id number] DOB: [birth year]  Meredith Owens is a 80 y.o. year old female who is a primary care patient of Simmons-Robinson, Riki Sheer, MD .   90 day F/U phone check-in today to assess needs, and progress with care plan goals.   Telephone outreach was unsuccessful. A HIPPA compliant phone message was left for the patient providing contact information and requesting a return call.   Plan:CCM LCSW will wait for return call. If no return call is received, Will route chart to Care Guide to see if patient would like to reschedule phone appointment   Review of patient status, including review of consultants reports, relevant laboratory and other test results, and collaboration with appropriate care team members and the patient's provider was performed as part of comprehensive patient evaluation and provision of care management services.     Meredith Owens, Meredith Owens / [place]   [phone] 10:38 AM

## 2021-01-09 NOTE — Patient Instructions (Signed)
Visit Information  Goals Addressed            This Visit's Progress   . COMPLETED:  Acknowledge receipt of Advanced Directive package        Timeframe:  Long-Range Goal Priority:  Medium Start Date:                             Expected End Date:                        Patient Goals/Self-Care Activities :  . Congratulations for completing your advance and providing a copy to the office    . Coping Skills Enhanced       Timeframe:  Long-Range Goal Priority:  High Start Date:  08/13/20                           Expected End Date:                        Patient Goals/Self-Care Activities:  . implement interventions discussed today to decreases symptoms of stress and increase knowledge and/or ability of: self-management skills . Writing things down, Don't be afraid to ask for help . Talk about your feelings with your daughter     Patient verbalizes understanding of instructions provided today and agrees to view in [place].   No follow up scheduled.  Patient will call office as needed  Rogena Deupree, [place] Management & Coordination  [phone]

## 2021-01-09 NOTE — Chronic Care Management (AMB) (Signed)
Care Management   Clinical Social Work Note  01/09/2021 Name: Meredith Owens MRN: [id number] DOB: 05-31-1941  Meredith Owens is a 80 y.o. year old female who is a primary care patient of Simmons-Robinson, [place], MD. The CCM team was consulted to assist the patient with chronic disease management and/or care coordination needs related to: Level of Care Concerns. And managing stress.   Engaged with patient by telephone for follow up visit in response to provider referral for social work chronic care management and care coordination services.   Consent to Services:  The patient was given information about Chronic Care Management services, agreed to services, and gave verbal consent prior to initiation of services.  Please see initial visit note for detailed documentation.   Patient agreed to services and consent obtained.   Assessment: Patient is engaged in conversation. She has completed her advance care planning goal.  Patient continues to experience difficulty with her memory and recognizes the changes.  She continues working to manage her stress while being engaged with her family.. See Care Plan below for interventions and patient self-care actives. Recent life changes Meredith Owens: forgetting to pay bills, having to ask for help; dental concerns and having teeth removed due to not being able to chew her food. .  Follow up Plan: Patient would like continued follow-up.  States she will contact the office to schedule phone appointment with Meredith Owens after her dental procedure.  CCM Meredith Owens will follow up with patient in 90 days if no call is recieved.    : Review of patient past medical history, allergies, medications, and health status, including review of relevant consultants reports was performed today as part of a comprehensive evaluation and provision of chronic care management and care coordination services.     SDOH (Social Determinants of Health) assessments and interventions performed:  SDOH  Interventions   Flowsheet Row Most Recent Value  SDOH Interventions   Stress Interventions Provide Counseling       Advanced Directives Status: See Vynca application for related entries.  CCM Care Plan  Allergies  Allergen Reactions  . Brimonidine Other (See Comments)    Red eyes  . Oxycodone Hcl     REACTION: Hives  . Prochlorperazine Edisylate     REACTION: anaphylaxis  . Latex Itching and Rash    Outpatient Encounter Medications as of 01/09/2021  Medication Sig  . CALCIUM + VITAMIN D3 600-10 MG-MCG TABS TAKE 2 TABLETS BY MOUTH EVERY DAY  . acetaminophen (TYLENOL 8 HOUR) 650 MG CR tablet Take 1 tablet (650 mg total) by mouth every 8 (eight) hours as needed for pain.  Marland Kitchen aspirin 81 MG tablet Take 81 mg by mouth daily.  . carvedilol (COREG) 12.5 MG tablet Take 1 tablet (12.5 mg total) by mouth 2 (two) times daily.  . CVS D3 50 MCG (2000 UT) CAPS TAKE 1 CAPSULE BY MOUTH EVERY DAY  . cycloSPORINE (RESTASIS OP) Apply to eye.  . diclofenac sodium (VOLTAREN) 1 % GEL Apply 4 g topically 4 (four) times daily.  . dorzolamide-timolol (COSOPT) 22.3-6.8 MG/ML ophthalmic solution 1 drop 2 (two) times daily.  . hydrALAZINE (APRESOLINE) 10 MG tablet TAKE 1 TABLET BY MOUTH THREE TIMES A DAY  . RESTASIS 0.05 % ophthalmic emulsion   . ROCKLATAN 0.02-0.005 % SOLN INSTILL 1 DROP INTO LEFT EYE AT BEDTIME  . rosuvastatin (CRESTOR) 10 MG tablet Take 1 tablet (10 mg total) by mouth every other day.  . valsartan (DIOVAN) 80 MG tablet TAKE 1 TABLET BY  MOUTH EVERY DAY  . White Petrolatum-Mineral Oil (CVS EYE LUBRICANT OP) Apply to eye.  . [DISCONTINUED] Calcium Carbonate-Vitamin D 600-400 MG-UNIT tablet Take 2 tablets by mouth daily.   No facility-administered encounter medications on file as of 01/09/2021.    Patient Active Problem List   Diagnosis Date Noted  . Primary osteoarthritis of left knee 07/10/2020  . Primary osteoarthritis of right knee 07/10/2020  . OA (osteoarthritis) of knee 02/19/2020   . Osteopenia 01/16/2020  . Goiter 07/20/2019  . Shoulder pain 05/03/2019  . Hallux valgus (acquired), left foot 10/20/2018  . Ptosis of left eyelid 10/20/2018  . Vitamin D insufficiency 08/18/2018  . Mild cognitive impairment with memory loss 03/13/2018  . Visual impairment 03/13/2018  . Skin abnormalities 09/21/2015  . Chronic kidney disease, stage 3 ([place]) 02/17/2015  . Glaucoma, left eye 01/22/2015  . Prediabetes 11/20/2013  . Healthcare maintenance 05/30/2012  . Primary osteoarthritis of both knees 06/21/2011  . Vitreous detachment 11/02/2010  . HYPERCHOLESTEROLEMIA 11/17/2006  . DEPRESSIVE DISORDER, NOS 10/06/2006  . Elevated blood pressure reading in office with white coat syndrome, with diagnosis of hypertension 10/06/2006    Conditions to be addressed/monitored:  Memory Deficits  Care Plan : Social Work  Updates made by Meredith Cane, Meredith Owens since 01/09/2021 12:00 AM  Problem: Long-Term Care Planning   Long-Range Goal: Effective Long-Term Care Planning /Over the next 45 days, the patient will have Advance Directive notarized and provide a copy to his provider Completed 01/09/2021  Start Date: 01/31/2020  Expected End Date: 11/06/2020  Recent Progress: On track  Priority: Low  Current barriers: Goal completed . Patient went to office to have advance directive notarized  . Does have an advance directive Interventions: . Inter-disciplinary care team collaboration (see longitudinal plan of care) . Assessed understanding of Advance Directives . Copy is filed under ACP in chart Patient Goals/Self-Care Activities :  . None    Problem: Coping Skills / stress   Goal: Coping Skills Enhanced and symptoms of stress managed   Start Date: 08/13/2020  This Visit's Progress: On track  Recent Progress: On track  Priority: High  Current barriers:   . Chronic Mental Health needs related to mild cognitive impairment and memory loss . Currently experiencing symptoms of stress which  seems to be exacerbated by her medical concerns   . Needs Support, Education, and Care Coordination in order to meet unmet need of managing her stress.   Clinical Interventions:  . Assessed patient's needs, coping skills, support system and barriers to care  Acknowledge, normalize and validate difficulty of making life-long lifestyle changes.   Identify current effective and ineffective coping strategies.   Stress of who will care for her and possibility of moving in with her daughter.   Active listening; Motivational Interviewing, Solution-Focused Strategies, Emotional/Supportive Counseling, and Problem Solving   Verbalization of feelings encouraged; setting boundaries with family members; when to ask for help. Bertram Savin care team collaboration (see longitudinal plan of care) Patient Goals/Self-Care Activities: Over the next 90 days . Implement interventions discussed today to decreases symptoms of stress and increase knowledge and/or ability of: self-management skills . Writing things down, Don't be afraid to ask for help . Talk about your feelings with your daughter     Meredith Owens, [place] / Grandfather   [phone] 1:52 PM

## 2021-01-12 ENCOUNTER — Other Ambulatory Visit: Payer: Self-pay | Admitting: *Deleted

## 2021-01-13 MED ORDER — ROSUVASTATIN CALCIUM 10 MG PO TABS: 10.0000 mg | ORAL_TABLET | ORAL | 3 refills | Status: DC

## 2021-01-26 ENCOUNTER — Other Ambulatory Visit: Payer: Self-pay | Admitting: Family Medicine

## 2021-02-02 DIAGNOSIS — Z961 Presence of intraocular lens: Secondary | ICD-10-CM | POA: Diagnosis not present

## 2021-02-02 DIAGNOSIS — Z970 Presence of artificial eye: Secondary | ICD-10-CM | POA: Diagnosis not present

## 2021-02-02 DIAGNOSIS — H02422 Myogenic ptosis of left eyelid: Secondary | ICD-10-CM | POA: Diagnosis not present

## 2021-02-02 DIAGNOSIS — H16222 Keratoconjunctivitis sicca, not specified as Sjogren's, left eye: Secondary | ICD-10-CM | POA: Diagnosis not present

## 2021-02-02 DIAGNOSIS — H43812 Vitreous degeneration, left eye: Secondary | ICD-10-CM | POA: Diagnosis not present

## 2021-02-02 DIAGNOSIS — H401222 Low-tension glaucoma, left eye, moderate stage: Secondary | ICD-10-CM | POA: Diagnosis not present

## 2021-03-02 ENCOUNTER — Other Ambulatory Visit: Payer: Self-pay | Admitting: Family Medicine

## 2021-03-10 ENCOUNTER — Ambulatory Visit: Payer: PPO | Admitting: Licensed Clinical Social Worker

## 2021-03-10 DIAGNOSIS — R4589 Other symptoms and signs involving emotional state: Secondary | ICD-10-CM

## 2021-03-10 DIAGNOSIS — F439 Reaction to severe stress, unspecified: Secondary | ICD-10-CM

## 2021-03-10 DIAGNOSIS — Z7189 Other specified counseling: Secondary | ICD-10-CM

## 2021-03-10 NOTE — Patient Instructions (Signed)
Visit Information   Goals Addressed             This Visit's Progress    Coping Skills Enhanced       Timeframe:  Long-Range Goal Priority:  High Start Date:  08/13/20                           Expected End Date:                        Patient Goals/Self-Care Activities: Over the next 30 days I will place the referral for counseling Keep appointment with Dr. Rosita FireQuentin Cornwall 04/09/2021      Patient verbalizes understanding of instructions provided today and agrees to view in [place].   No f/u scheduled will f/u with patient 3 to 4 weeks based on referrals placed today  Casimer Lanius, [place] Management & Coordination  [phone]

## 2021-03-10 NOTE — Chronic Care Management (AMB) (Signed)
Care Management   Clinical Social Work Note  03/10/2021 Name: Carmetta Bering MRN: [id number] DOB: 01-12-1941  Meredith Owens is a 80 y.o. year old female who is a primary care patient of Simmons-Robinson, Makiera, MD. The CCM team was consulted to assist the patient with chronic disease management and/or care coordination needs related to:  stress management and life transitions .   Engaged with patient by telephone for follow up visit in response to provider referral for social work chronic care management and care coordination services.   Consent to Services:  The patient was given information about Chronic Care Management services, agreed to services, and gave verbal consent prior to initiation of services.  Please see initial visit note for detailed documentation.   Patient agreed to services and consent obtained.   Assessment: Patient is engaged in conversation she is very talkative today and reports starting to feel down at times when she thinks of a traumatic event that happened in her family . See Care Plan below for interventions and patient self-care actives.  Recommendation: Patient may benefit from, and is in agreement to allow LCSW to make referral for ongoing counseling and appointment with PCP.  Follow up Plan: No follow up scheduled with CCM team at this time. Will follow up with patient in 30 days or sooner based on referral.  Patient will call LCSW as needed .   Review of patient past medical history, allergies, medications, and health status, including review of relevant consultants reports was performed today as part of a comprehensive evaluation and provision of chronic care management and care coordination services.     SDOH (Social Determinants of Health) assessments and interventions performed:    Advanced Directives Status: See Vynca application for related entries.  CCM Care Plan  Allergies  Allergen Reactions   Brimonidine Other (See Comments)    Red eyes    Oxycodone Hcl     REACTION: Hives   Prochlorperazine Edisylate     REACTION: anaphylaxis   Latex Itching and Rash    Outpatient Encounter Medications as of 03/10/2021  Medication Sig   CALCIUM + VITAMIN D3 600-10 MG-MCG TABS TAKE 2 TABLETS BY MOUTH EVERY DAY   acetaminophen (TYLENOL 8 HOUR) 650 MG CR tablet Take 1 tablet (650 mg total) by mouth every 8 (eight) hours as needed for pain.   aspirin 81 MG tablet Take 81 mg by mouth daily.   carvedilol (COREG) 12.5 MG tablet TAKE 1 TABLET BY MOUTH 2 TIMES DAILY.   CVS D3 50 MCG (2000 UT) CAPS TAKE 1 CAPSULE BY MOUTH EVERY DAY   cycloSPORINE (RESTASIS OP) Apply to eye.   diclofenac sodium (VOLTAREN) 1 % GEL Apply 4 g topically 4 (four) times daily.   dorzolamide-timolol (COSOPT) 22.3-6.8 MG/ML ophthalmic solution 1 drop 2 (two) times daily.   hydrALAZINE (APRESOLINE) 10 MG tablet TAKE 1 TABLET BY MOUTH THREE TIMES A DAY   RESTASIS 0.05 % ophthalmic emulsion    ROCKLATAN 0.02-0.005 % SOLN INSTILL 1 DROP INTO LEFT EYE AT BEDTIME   rosuvastatin (CRESTOR) 10 MG tablet Take 1 tablet (10 mg total) by mouth every other day.   valsartan (DIOVAN) 80 MG tablet TAKE 1 TABLET BY MOUTH EVERY DAY   White Petrolatum-Mineral Oil (CVS EYE LUBRICANT OP) Apply to eye.   [DISCONTINUED] Calcium Carbonate-Vitamin D 600-400 MG-UNIT tablet Take 2 tablets by mouth daily.   No facility-administered encounter medications on file as of 03/10/2021.    Patient Active Problem List   Diagnosis  Date Noted   Primary osteoarthritis of left knee 07/10/2020   Primary osteoarthritis of right knee 07/10/2020   OA (osteoarthritis) of knee 02/19/2020   Osteopenia 01/16/2020   Goiter 07/20/2019   Shoulder pain 05/03/2019   Hallux valgus (acquired), left foot 10/20/2018   Ptosis of left eyelid 10/20/2018   Vitamin D insufficiency 08/18/2018   Mild cognitive impairment with memory loss 03/13/2018   Visual impairment 03/13/2018   Skin abnormalities 09/21/2015   Chronic kidney  disease, stage 3 ([place]) 02/17/2015   Glaucoma, left eye 01/22/2015   Prediabetes 11/20/2013   Healthcare maintenance 05/30/2012   Primary osteoarthritis of both knees 06/21/2011   Vitreous detachment 11/02/2010   HYPERCHOLESTEROLEMIA 11/17/2006   DEPRESSIVE DISORDER, NOS 10/06/2006   Elevated blood pressure reading in office with white coat syndrome, with diagnosis of hypertension 10/06/2006    Conditions to be addressed/monitored:   stress management   Care Plan : Social Work  Updates made by Maurine Cane, LCSW since 03/10/2021 12:00 AM     Problem: Coping Skills / stress      Goal: Coping Skills Enhanced and symptoms of stress managed   Start Date: 08/13/2020  Recent Progress: On track  Priority: High  Note:   Current barriers:   Chronic medical concerns and Mental Health needs related memory loss and symptoms of depression. Currently experiencing symptoms of stress which seems to be exacerbated by her medical concerns and past trauma   Needs Support, Education, and Care Coordination in order to meet unmet need of managing her stress.   Clinical Interventions:  Assessed patient's needs, coping skills, support system and barriers to care Acknowledge, normalize and validate difficulty of making lifestyle changes.  Discussed and Identify effective and ineffective coping strategies.  Active listening; Motivational Interviewing, Solution-Focused Strategies, Emotional/Supportive Counseling, and Problem Solving  Verbalization of feelings encouraged; encouraged ongoing counseling due to hx of trauma and benefits of counseling. Discussed counseling options; called insurance provider with patient to assist with connecting for counseling and find out co-pay Placed referral for counseling  Assisted with scheduling appointment to see PCP 04/09/21 Inter-disciplinary care team collaboration (see longitudinal plan of care) Patient Goals/Self-Care Activities: Over the next 30 days I will place  the referral for counseling Keep appointment with Dr. Rosita Fire- Quentin Cornwall 04/09/2021      Casimer Lanius, [place] / [place]   [phone] 2:45 PM

## 2021-03-12 ENCOUNTER — Telehealth: Payer: PPO

## 2021-03-12 ENCOUNTER — Other Ambulatory Visit: Payer: Self-pay | Admitting: Family Medicine

## 2021-04-07 ENCOUNTER — Ambulatory Visit: Payer: PPO | Admitting: Licensed Clinical Social Worker

## 2021-04-07 DIAGNOSIS — Z7189 Other specified counseling: Secondary | ICD-10-CM

## 2021-04-07 NOTE — Patient Instructions (Signed)
Visit Information   Goals Addressed             This Visit's Progress    Coping Skills Enhanced   On track    Timeframe:  Long-Range Goal Priority:  High Start Date:  08/13/20                           Expected End Date:                        Patient Goals/Self-Care Activities: Over the next 30 days Keep appointment with Dr. Rosita FireQuentin Cornwall 04/09/2021        It was a pleasure speaking with you today. Please call the office if needed No follow up scheduled, per our conversation I will contact you in 30 to 60 days.  Patient verbalizes understanding of instructions provided today.   Casimer Lanius, LCSW Care Management & Coordination  [phone]

## 2021-04-07 NOTE — Chronic Care Management (AMB) (Signed)
Care Management Clinical Social Work Note  04/07/2021 Name: Meredith Owens MRN: [id number] DOB: Mar 05, 1941  Meredith Owens is a 80 y.o. year old female who is a primary care patient of Simmons-Robinson, Riki Sheer, MD.  The Care Management team was consulted for assistance with coordination needs.Mental Health Counseling and Resources  Consent to Services:  The patient was given information about Care Management services, agreed to services, and gave verbal consent prior to initiation of services.  Please see initial visit note for detailed documentation.   Patient agreed to services today and consent obtained.   Assessment: Engaged with patient by phone in response to provider referral for social work care coordination services: .   She continues to experience difficulty with managing stress related to her health referral  placed to [facility] for psychiatry and counseling. Time spent today focusing coping skills. See Care Plan below for interventions and patient self-care actives.  Follow up Plan: Patient would like continued follow-up from CCM LCSW .  per patient's request will follow up in 30 to 60 days.  Will call office if needed prior to next encounter.    : Review of patient past medical history, allergies, medications, and health status, including review of relevant consultants reports was performed today as part of a comprehensive evaluation and provision of chronic care management and care coordination services.  SDOH (Social Determinants of Health) assessments and interventions performed:  SDOH Interventions    Flowsheet Row Most Recent Value  SDOH Interventions   Stress Interventions Provide Counseling        Advanced Directives Status: See Vynca application for related entries.  Care Plan  Allergies  Allergen Reactions   Brimonidine Other (See Comments)    Red eyes   Oxycodone Hcl     REACTION: Hives   Prochlorperazine Edisylate     REACTION: anaphylaxis    Latex Itching and Rash    Outpatient Encounter Medications as of 04/07/2021  Medication Sig   CALCIUM + VITAMIN D3 600-10 MG-MCG TABS TAKE 2 TABLETS BY MOUTH EVERY DAY   valsartan (DIOVAN) 80 MG tablet TAKE 1 TABLET BY MOUTH EVERY DAY   acetaminophen (TYLENOL 8 HOUR) 650 MG CR tablet Take 1 tablet (650 mg total) by mouth every 8 (eight) hours as needed for pain.   aspirin 81 MG tablet Take 81 mg by mouth daily.   carvedilol (COREG) 12.5 MG tablet TAKE 1 TABLET BY MOUTH 2 TIMES DAILY.   CVS D3 50 MCG (2000 UT) CAPS TAKE 1 CAPSULE BY MOUTH EVERY DAY   cycloSPORINE (RESTASIS OP) Apply to eye.   diclofenac sodium (VOLTAREN) 1 % GEL Apply 4 g topically 4 (four) times daily.   dorzolamide-timolol (COSOPT) 22.3-6.8 MG/ML ophthalmic solution 1 drop 2 (two) times daily.   hydrALAZINE (APRESOLINE) 10 MG tablet TAKE 1 TABLET BY MOUTH THREE TIMES A DAY   RESTASIS 0.05 % ophthalmic emulsion    ROCKLATAN 0.02-0.005 % SOLN INSTILL 1 DROP INTO LEFT EYE AT BEDTIME   rosuvastatin (CRESTOR) 10 MG tablet Take 1 tablet (10 mg total) by mouth every other day.   White Petrolatum-Mineral Oil (CVS EYE LUBRICANT OP) Apply to eye.   [DISCONTINUED] Calcium Carbonate-Vitamin D 600-400 MG-UNIT tablet Take 2 tablets by mouth daily.   No facility-administered encounter medications on file as of 04/07/2021.    Patient Active Problem List   Diagnosis Date Noted   Primary osteoarthritis of left knee 07/10/2020   Primary osteoarthritis of right knee 07/10/2020   OA (osteoarthritis) of  knee 02/19/2020   Osteopenia 01/16/2020   Goiter 07/20/2019   Shoulder pain 05/03/2019   Hallux valgus (acquired), left foot 10/20/2018   Ptosis of left eyelid 10/20/2018   Vitamin D insufficiency 08/18/2018   Mild cognitive impairment with memory loss 03/13/2018   Visual impairment 03/13/2018   Skin abnormalities 09/21/2015   Chronic kidney disease, stage 3 ([place]) 02/17/2015   Glaucoma, left eye 01/22/2015   Prediabetes 11/20/2013    Healthcare maintenance 05/30/2012   Primary osteoarthritis of both knees 06/21/2011   Vitreous detachment 11/02/2010   HYPERCHOLESTEROLEMIA 11/17/2006   DEPRESSIVE DISORDER, NOS 10/06/2006   Elevated blood pressure reading in office with white coat syndrome, with diagnosis of hypertension 10/06/2006    Conditions to be addressed/monitored: Anxiety; Memory Deficits  Care Plan : Social Work  Updates made by Maurine Cane, LCSW since 04/07/2021 12:00 AM     Problem: Coping Skills / stress      Goal: Coping Skills Enhanced and symptoms of stress managed   Start Date: 08/13/2020  This Visit's Progress: On track  Recent Progress: On track  Priority: High  Note:   Current barriers:   Chronic medical concerns and Mental Health needs related memory loss and symptoms of depression. Currently experiencing symptoms of stress which seems to be exacerbated by her medical concerns and past trauma   Needs Support, Education, and Care Coordination in order to meet unmet need of managing her stress.   Clinical Interventions:  Assessed patient's needs, coping skills, support system and barriers to care Discussed concerns of not eating and would like supplements ( Pt will discuss with PCP) Acknowledge, normalize and validate difficulty of making lifestyle changes.  Discussed and Identify effective and ineffective coping strategies.  Active listening; Motivational Interviewing, Solution-Focused Strategies, Emotional/Supportive Counseling, and Problem Solving  Verbalization of feelings encouraged; encouraged ongoing counseling due to hx of trauma and benefits of counseling. Discussed counseling options; referral for counseling and psychiatry at [facility] [place] care team collaboration (see longitudinal plan of care) Patient Goals/Self-Care Activities: Over the next 30 days Keep appointment with Dr. Rosita Fire- Quentin Cornwall 04/09/2021      Casimer Lanius, [place] / [place]   [phone] 11:21 AM

## 2021-04-08 NOTE — Progress Notes (Signed)
    SUBJECTIVE:   CHIEF COMPLAINT / HPI: Hypertension follow-up  HTN  Patient presents with concerns for her blood pressure.  She is taking valsartan 80 mg daily, hydralazine 3 times daily and Coreg twice daily.  She states that she has been able to tolerate her medications.  She denies any headache, chest pain, shortness of breath at this time. Reports that her home BP ranged in the 140s-150s without any of the previously mentioned symptoms.   Mood Concerns for depression  Patient continues to report depressed mood intermittently.  She denies homicidal suicidal ideation.  Patient states that she often feels "lonely".  Patient reports that her appetite is better when she is around other people.  She states that she sometimes has problems with sleeping.  Decreased Appetite Patient and her daughter reported that she has had decreased appetite for a while.  Patient has lost weight since last visit and has consistently had decreased weight over the course of the last 2 years.  Patient has a normal evaluation for weight loss in the past few months of this year.  Patient denies any nausea.  Patient states that she has been having trouble with her teeth requiring dental visits with painful deep cleanings and she often has trouble chewing certain things such as meat which contributes to her decreased appetite.  PERTINENT  PMH / PSH:  Whitecoat hypertension  OBJECTIVE:   BP (!) 206/65   Pulse 60   Wt 116 lb 3.2 oz (52.7 kg)   SpO2 100%   BMI 22.69 kg/m   Recheck BP: 186/86  General: female  appearing stated age in no acute distress Cardio: Normal S1 and S2, no S3 or S4. Rhythm is regular. No murmurs or rubs.  Bilateral radial pulses palpable Pulm: Clear to auscultation bilaterally, no crackles, wheezing, or diminished breath sounds. Normal respiratory effort Abdomen: Bowel sounds normal. Abdomen soft and non-tender.  Extremities: No peripheral edema. Warm & well perfused.  Neuro: pt alert and  oriented x4, follows commands, PERRLA, EOMI bilaterally   ASSESSMENT/PLAN:   Elevated blood pressure reading in office with white coat syndrome, with diagnosis of hypertension Continues to have severely elevated BP in office. Recheck measurement as listed above with my recheck during PE. No red flag symptoms present today.  Continue current regimen   Depressed mood Patient endorses loneliness and decreased appetite in setting of pain with dental procedures for gum disease. Patient prefers to establish with virtual therapist in order to help with coping skills. Patient and daughter given list of potential therapists. Patient is also working with CCM to establish with a therapist.  - prescribed mirtazapine to help with mood and potentially increase appetite     Eulis Foster, MD [place]

## 2021-04-09 ENCOUNTER — Ambulatory Visit (INDEPENDENT_AMBULATORY_CARE_PROVIDER_SITE_OTHER): Payer: PPO | Admitting: Family Medicine

## 2021-04-09 ENCOUNTER — Encounter: Payer: Self-pay | Admitting: Family Medicine

## 2021-04-09 ENCOUNTER — Other Ambulatory Visit: Payer: Self-pay

## 2021-04-09 ENCOUNTER — Ambulatory Visit: Payer: PPO | Admitting: Licensed Clinical Social Worker

## 2021-04-09 DIAGNOSIS — R4589 Other symptoms and signs involving emotional state: Secondary | ICD-10-CM

## 2021-04-09 DIAGNOSIS — Z7189 Other specified counseling: Secondary | ICD-10-CM

## 2021-04-09 DIAGNOSIS — I10 Essential (primary) hypertension: Secondary | ICD-10-CM | POA: Diagnosis not present

## 2021-04-09 MED ORDER — MIRTAZAPINE 7.5 MG PO TABS: 7.5000 mg | ORAL_TABLET | Freq: Every day | ORAL | 0 refills | Status: DC

## 2021-04-09 NOTE — Patient Instructions (Signed)
Visit Information   Goals Addressed             This Visit's Progress    Coping Skills Enhanced   On track    Timeframe:  Long-Range Goal Priority:  High Start Date:  08/13/20                           Expected End Date:                        Patient Goals/Self-Care Activities:  Pick up medication and take it per PCP Call insurance provider for co-pay amount for therapy Drink your ensure      It was a pleasure speaking with you today. per your request your appointment is scheduled 04/15/2021 Please call the office if needed Patient verbalizes understanding of instructions provided today.   Casimer Lanius, LCSW Care Management & Coordination  [phone]

## 2021-04-09 NOTE — Patient Instructions (Signed)
I have prescribed a medication called mirtazepine to help with your mood and may help with your appetite. Please take this at bedtime.   Please see the list below regarding a therapist to help talk about the things that may be worrying you.    Psychiatry Resource List (Adults and Children) Most of these providers will take Medicaid. please consult your insurance for a complete and updated list of available providers. When calling to make an appointment have your insurance information available to confirm you are covered.   [facility] (virtual only) [phone]   [facility]iatry and Counseling [address] [place], [place] [address] [phone]  [facility]  [place], [place]:   [facility]: [address]     [phone]   Linna Hoff: [place]. [place],        [phone] [place]: [place],    [place]: [phone] [address],                   [phone] Children: [facility] [place]         [place]  (Psychiatry only; Adults /children 12 and over, will take Medicaid)  [place], [place], [place] [address]       [phone]   [place] (Psychiatry & counseling ; adults & children ; will take Medicaid [address]  [address]  [place] [address]  Go on-line to complete referral ( [email or web address] [phone]    (Spanish speaking therapists)  [facility]  Psychiatry & counseling; Adults and children;  Call Registration prior to scheduling an appointment [phone] [address]    [place],  [address]    [phone]  [facility] (Psychiatry & counseling; adults & children; Medicare  no Medicaid)  [place],   [address]      [phone]      [facility] --- Walk-In Mon-Friday 8am-3pm ( will take Medicaid, Psychiatry, Adults & children,  [address], [place], [place]   [phone]

## 2021-04-09 NOTE — Chronic Care Management (AMB) (Signed)
Care Management Clinical Social Work Note  04/09/2021 Name: Meredith Owens MRN: [id number] DOB: [birth year]  Meredith Owens is a 80 y.o. year old female who is a primary care patient of Simmons-Robinson, Riki Sheer, MD.  The Care Management team was consulted for assistance with coordination needs.  Consent to Services:  The patient was given information about Care Management services, agreed to services, and gave verbal consent prior to initiation of services.  Please see initial visit note for detailed documentation.   Patient agreed to services today and consent obtained.   Assessment:  Incoming phone call from patient after visit with PCP, she continues to experience difficulty with some confusion with understand instruction from provider's visit today. LCSW reviewed what patient needed to do and in 10 min she called back unclear of what she needed to do.. See Care Plan below for interventions and patient self-care actives.  Recommendation: Patient may benefit from, and is in agreement call insurance provider to find out co-pay for counseling. Will assist with this process if she is unable to complete by next encounter.  Follow up Plan: Patient would like continued follow-up from CCM LCSW .  per patient's request will follow up in 1 week.  Will call office if needed prior to next encounter.    Review of patient past medical history, allergies, medications, and health status, including review of relevant consultants reports was performed today as part of a comprehensive evaluation and provision of chronic care management and care coordination services.  SDOH (Social Determinants of Health) assessments and interventions performed:    Advanced Directives Status: See Vynca application for related entries.  Care Plan  Allergies  Allergen Reactions   Brimonidine Other (See Comments)    Red eyes   Oxycodone Hcl     REACTION: Hives   Prochlorperazine Edisylate     REACTION: anaphylaxis   Latex  Itching and Rash    Outpatient Encounter Medications as of 04/09/2021  Medication Sig   valsartan (DIOVAN) 80 MG tablet TAKE 1 TABLET BY MOUTH EVERY DAY   acetaminophen (TYLENOL 8 HOUR) 650 MG CR tablet Take 1 tablet (650 mg total) by mouth every 8 (eight) hours as needed for pain.   aspirin 81 MG tablet Take 81 mg by mouth daily.   carvedilol (COREG) 12.5 MG tablet TAKE 1 TABLET BY MOUTH 2 TIMES DAILY.   CVS D3 50 MCG (2000 UT) CAPS TAKE 1 CAPSULE BY MOUTH EVERY DAY   cycloSPORINE (RESTASIS OP) Apply to eye.   diclofenac sodium (VOLTAREN) 1 % GEL Apply 4 g topically 4 (four) times daily.   dorzolamide-timolol (COSOPT) 22.3-6.8 MG/ML ophthalmic solution 1 drop 2 (two) times daily.   hydrALAZINE (APRESOLINE) 10 MG tablet TAKE 1 TABLET BY MOUTH THREE TIMES A DAY   mirtazapine (REMERON) 7.5 MG tablet Take 1 tablet (7.5 mg total) by mouth at bedtime.   RESTASIS 0.05 % ophthalmic emulsion    ROCKLATAN 0.02-0.005 % SOLN INSTILL 1 DROP INTO LEFT EYE AT BEDTIME   rosuvastatin (CRESTOR) 10 MG tablet Take 1 tablet (10 mg total) by mouth every other day.   White Petrolatum-Mineral Oil (CVS EYE LUBRICANT OP) Apply to eye.   [DISCONTINUED] Calcium Carbonate-Vitamin D 600-400 MG-UNIT tablet Take 2 tablets by mouth daily.   No facility-administered encounter medications on file as of 04/09/2021.    Patient Active Problem List   Diagnosis Date Noted   Primary osteoarthritis of left knee 07/10/2020   Primary osteoarthritis of right knee 07/10/2020   OA (osteoarthritis)  of knee 02/19/2020   Osteopenia 01/16/2020   Goiter 07/20/2019   Shoulder pain 05/03/2019   Hallux valgus (acquired), left foot 10/20/2018   Ptosis of left eyelid 10/20/2018   Vitamin D insufficiency 08/18/2018   Mild cognitive impairment with memory loss 03/13/2018   Visual impairment 03/13/2018   Skin abnormalities 09/21/2015   Chronic kidney disease, stage 3 ([place]) 02/17/2015   Glaucoma, left eye 01/22/2015   Prediabetes  11/20/2013   Healthcare maintenance 05/30/2012   Primary osteoarthritis of both knees 06/21/2011   Vitreous detachment 11/02/2010   HYPERCHOLESTEROLEMIA 11/17/2006   DEPRESSIVE DISORDER, NOS 10/06/2006   Elevated blood pressure reading in office with white coat syndrome, with diagnosis of hypertension 10/06/2006    Conditions to be addressed/monitored: Depression; Memory Deficits  Care Plan : Social Work  Updates made by Maurine Cane, LCSW since 04/09/2021 12:00 AM     Problem: Coping Skills / stress      Goal: Coping Skills Enhanced and symptoms of stress managed   Start Date: 08/13/2020  This Visit's Progress: On track  Recent Progress: On track  Priority: High  Note:   Current barriers:   Chronic medical concerns and Mental Health needs related memory loss and symptoms of depression. Currently experiencing symptoms of stress which seems to be exacerbated by her medical concerns and past trauma   Needs Support, Education, and Care Coordination in order to meet unmet need of managing her stress.   Clinical Interventions:  Assessed patient's needs, coping skills, support system and barriers to care Discussed concerns of not eating and would like supplements ( Pt will discuss with PCP) Picked up ensure samples PCP started on new medication today  Acknowledge, normalize and validate difficulty of making lifestyle changes.  Discussed and Identify effective and ineffective coping strategies.  Active listening; Motivational Interviewing, Solution-Focused Strategies, Emotional/Supportive Counseling, and Problem Solving  Verbalization of feelings encouraged; encouraged ongoing counseling due to hx of trauma and benefits of counseling. Collaborated with PCP for ongoing needs ( would like patient to start talk therapy / virtual) Inter-disciplinary care team collaboration (see longitudinal plan of care) Patient Goals/Self-Care Activities: Over the next 30 days Pick up medication and  take it per PCP Call insurance provider for co-pay amount for therapy Drink your ensure     Taleshia Mormann, [place] / [place]   [phone] 4:16 PM

## 2021-04-14 DIAGNOSIS — R4589 Other symptoms and signs involving emotional state: Secondary | ICD-10-CM | POA: Insufficient documentation

## 2021-04-14 NOTE — Assessment & Plan Note (Signed)
Continues to have severely elevated BP in office. Recheck measurement as listed above with my recheck during PE. No red flag symptoms present today.  Continue current regimen

## 2021-04-14 NOTE — Assessment & Plan Note (Signed)
Patient endorses loneliness and decreased appetite in setting of pain with dental procedures for gum disease. Patient prefers to establish with virtual therapist in order to help with coping skills. Patient and daughter given list of potential therapists. Patient is also working with CCM to establish with a therapist.  - prescribed mirtazapine to help with mood and potentially increase appetite

## 2021-04-15 ENCOUNTER — Telehealth: Payer: Self-pay

## 2021-04-15 ENCOUNTER — Ambulatory Visit: Payer: PPO | Admitting: Licensed Clinical Social Worker

## 2021-04-15 DIAGNOSIS — Z7189 Other specified counseling: Secondary | ICD-10-CM

## 2021-04-15 DIAGNOSIS — E049 Nontoxic goiter, unspecified: Secondary | ICD-10-CM

## 2021-04-15 NOTE — Telephone Encounter (Signed)
Patient calls nurse line with medication questions. Patient has concerns with starting medication after reading possible side effects.   Patient would like to speak with provider regarding medication. Please return call to patient at [phone].  Talbot Grumbling, RN

## 2021-04-15 NOTE — Telephone Encounter (Signed)
Contacted patient in order to discuss concerns for medication side effects No answer at this time and no option for voicemail. Patient will need PTH and calcium thyroid ultrasound to follow-up for her history of goiter.  Orders placed for future.  Eulis Foster, MD [place], PGY-3 [phone]

## 2021-04-16 NOTE — Chronic Care Management (AMB) (Signed)
Care Management Clinical Social Work Note  04/16/2021 Name: Meredith Owens MRN: [id number] DOB: November 29, 1940  Meredith Owens is a 80 y.o. year old female who is a primary care patient of Simmons-Robinson, Riki Sheer, MD.  The Care Management team was consulted for assistance with coordination needs.  Consent to Services:  The patient was given information about Care Management services, agreed to services, and gave verbal consent prior to initiation of services.  Please see initial visit note for detailed documentation.   Patient agreed to services today and consent obtained.   Assessment: Engaged with patient by phone in response to provider referral for social work care coordination services: [place] [facility].   She continues to experience difficulty with locating a mental health provider to meet her needs.. Needs virtual therapy that is in-network with [facility].See Care Plan below for interventions and patient self-care actives.  Follow up Plan:  Patient would like continued follow-up from CCM LCSW .  per patient's request will follow up in 1 to 2 weeks.  Will call office if needed prior to next encounter. CCM LCSW will continue to collaborate with providers in order to meet patient's needs .    Review of patient past medical history, allergies, medications, and health status, including review of relevant consultants reports was performed today as part of a comprehensive evaluation and provision of chronic care management and care coordination services.  SDOH (Social Determinants of Health) assessments and interventions performed:    Advanced Directives Status: See Vynca application for related entries.  Care Plan  Allergies  Allergen Reactions   Brimonidine Other (See Comments)    Red eyes   Oxycodone Hcl     REACTION: Hives   Prochlorperazine Edisylate     REACTION: anaphylaxis   Latex Itching and Rash    Outpatient Encounter Medications as of  04/15/2021  Medication Sig   valsartan (DIOVAN) 80 MG tablet TAKE 1 TABLET BY MOUTH EVERY DAY   acetaminophen (TYLENOL 8 HOUR) 650 MG CR tablet Take 1 tablet (650 mg total) by mouth every 8 (eight) hours as needed for pain.   aspirin 81 MG tablet Take 81 mg by mouth daily.   carvedilol (COREG) 12.5 MG tablet TAKE 1 TABLET BY MOUTH 2 TIMES DAILY.   [employer]S D3 50 MCG (2000 UT) CAPS TAKE 1 CAPSULE BY MOUTH EVERY DAY   cycloSPORINE (RESTASIS OP) Apply to eye.   diclofenac sodium (VOLTAREN) 1 % GEL Apply 4 g topically 4 (four) times daily.   dorzolamide-timolol (COSOPT) 22.3-6.8 MG/ML ophthalmic solution 1 drop 2 (two) times daily.   hydrALAZINE (APRESOLINE) 10 MG tablet TAKE 1 TABLET BY MOUTH THREE TIMES A DAY   mirtazapine (REMERON) 7.5 MG tablet Take 1 tablet (7.5 mg total) by mouth at bedtime.   RESTASIS 0.05 % ophthalmic emulsion    ROCKLATAN 0.02-0.005 % SOLN INSTILL 1 DROP INTO LEFT EYE AT BEDTIME   rosuvastatin (CRESTOR) 10 MG tablet Take 1 tablet (10 mg total) by mouth every other day.   White Petrolatum-Mineral Oil (CVS EYE LUBRICANT OP) Apply to eye.   No facility-administered encounter medications on file as of 04/15/2021.    Patient Active Problem List   Diagnosis Date Noted   Depressed mood 04/14/2021   Primary osteoarthritis of left knee 07/10/2020   Primary osteoarthritis of right knee 07/10/2020   OA (osteoarthritis) of knee 02/19/2020   Osteopenia 01/16/2020   Goiter 07/20/2019   Shoulder pain 05/03/2019   Hallux valgus (acquired), left foot 10/20/2018  Ptosis of left eyelid 10/20/2018   Vitamin D insufficiency 08/18/2018   Mild cognitive impairment with memory loss 03/13/2018   Visual impairment 03/13/2018   Skin abnormalities 09/21/2015   Chronic kidney disease, stage 3 ([place]) 02/17/2015   Glaucoma, left eye 01/22/2015   Prediabetes 11/20/2013   Healthcare maintenance 05/30/2012   Primary osteoarthritis of both knees 06/21/2011   Vitreous detachment 11/02/2010    HYPERCHOLESTEROLEMIA 11/17/2006   DEPRESSIVE DISORDER, NOS 10/06/2006   Elevated blood pressure reading in office with white coat syndrome, with diagnosis of hypertension 10/06/2006    Conditions to be addressed/monitored: Anxiety and Depression;   Care Plan : Social Work  Updates made by Maurine Cane, LCSW since 04/16/2021 12:00 AM     Problem: Coping Skills / stress      Goal: Coping Skills Enhanced and symptoms of stress managed   Start Date: 08/13/2020  This Visit's Progress: Not on track  Recent Progress: On track  Priority: High  Note:   Current barriers:   Unable to locate a mental health provider in-network with patient's insurance/ also made referral to [facility] outpatient  is concerned with side effects of Remeron and did not start taking Chronic medical concerns and Mental Health needs related memory loss and symptoms of depression. Currently experiencing symptoms of stress which seems to be exacerbated by her medical concerns and past trauma   Needs Support, Education, and Care Coordination in order to meet unmet need of managing her stress.   Clinical Interventions:  Assessed patient's needs, coping skills, support system and barriers to care Discussed and Identify effective and ineffective coping strategies.  Active listening; Motivational Interviewing, Solution-Focused Strategies, Emotional/Supportive Counseling, and Problem Solving  Verbalization of feelings encouraged; encouraged ongoing counseling due to hx of trauma and benefits of counseling. Collaborated with PCP for ongoing needs ( patient did not start taking medication(remeron) Collaborated with insurance provider to send list of in-network providers for therapy; patient will have 0 co-pay for virtual visits Collaborate with [facility]. Barriers for therapist ( unable to complete referral) Collaboration with CCM team members to remover barriers with locating a provider for patient due to  insurance Inter-disciplinary care team collaboration (see longitudinal plan of care) Patient Goals/Self-Care Activities: Over the next 30 days Call PCP to discuss your concerns about starting the medication I will also send her a message to her about your concerns of not starting the medication  Drink your ensure      Naw Rosenfeld, [place] / [place]   [phone] 12:27 PM

## 2021-04-16 NOTE — Patient Instructions (Signed)
Visit Information   Goals Addressed             This Visit's Progress    Coping Skills Enhanced   Not on track    Timeframe:  Long-Range Goal Priority:  High Start Date:  08/13/20                           Expected End Date:                        Patient Goals/Self-Care Activities:  Call PCP to discuss your concerns about starting the medication I will also send her a message to her about your concerns of not starting the medication  Drink your ensure       It was a pleasure speaking with you today. No follow up scheduled, per our conversation I will contact you in 1 to 2 weeks.  Patient verbalizes understanding of instructions provided today.   Casimer Lanius, LCSW Care Management & Coordination  [phone]

## 2021-04-21 ENCOUNTER — Ambulatory Visit: Payer: PPO | Admitting: Licensed Clinical Social Worker

## 2021-04-21 NOTE — Chronic Care Management (AMB) (Signed)
Care Management Clinical Social Work Note  04/21/2021 Name: Meredith Owens MRN: [id number] DOB: January 30, 1941  Meredith Owens is a 80 y.o. year old female who is a primary care patient of Simmons-Robinson, Riki Sheer, MD.  The Care Management team was consulted for assistance with coordination needs. Mental Health Counseling and Resources  Consent to Services:  The patient was given information about Care Management services, agreed to services, and gave verbal consent prior to initiation of services.  Please see initial visit note for detailed documentation.   Patient agreed to services today and consent obtained.   Assessment: Engaged with patient by phone in response to provider referral for social work care coordination services: .   Patient was accompanied by daughter [name] who provided information during this encounter..Pateint is not comfortable taking medication prescribed by PCP to help with mood.  See Care Plan below for interventions and patient self-care actives.  Recent life changes or stressors: unable to connect with counseling due to technology barrier/ patient not able to use cell.   Recommendation: Patient may benefit from, and is in agreement for LCSW to continue to provide support.   Follow up Plan: Patient would like continued follow-up from CCM LCSW .  per patient's request will follow up in 2 to 3 weeks.  Will call office if needed prior to next encounter.   Review of patient past medical history, allergies, medications, and health status, including review of relevant consultants reports was performed today as part of a comprehensive evaluation and provision of chronic care management and care coordination services.  SDOH (Social Determinants of Health) assessments and interventions performed:    Advanced Directives Status: See Vynca application for related entries.  Care Plan  Allergies  Allergen Reactions   Brimonidine Other (See Comments)    Red eyes   Oxycodone Hcl      REACTION: Hives   Prochlorperazine Edisylate     REACTION: anaphylaxis   Latex Itching and Rash    Outpatient Encounter Medications as of 04/21/2021  Medication Sig   valsartan (DIOVAN) 80 MG tablet TAKE 1 TABLET BY MOUTH EVERY DAY   acetaminophen (TYLENOL 8 HOUR) 650 MG CR tablet Take 1 tablet (650 mg total) by mouth every 8 (eight) hours as needed for pain.   aspirin 81 MG tablet Take 81 mg by mouth daily.   carvedilol (COREG) 12.5 MG tablet TAKE 1 TABLET BY MOUTH 2 TIMES DAILY.   CVS D3 50 MCG (2000 UT) CAPS TAKE 1 CAPSULE BY MOUTH EVERY DAY   cycloSPORINE (RESTASIS OP) Apply to eye.   diclofenac sodium (VOLTAREN) 1 % GEL Apply 4 g topically 4 (four) times daily.   dorzolamide-timolol (COSOPT) 22.3-6.8 MG/ML ophthalmic solution 1 drop 2 (two) times daily.   hydrALAZINE (APRESOLINE) 10 MG tablet TAKE 1 TABLET BY MOUTH THREE TIMES A DAY   mirtazapine (REMERON) 7.5 MG tablet Take 1 tablet (7.5 mg total) by mouth at bedtime.   RESTASIS 0.05 % ophthalmic emulsion    ROCKLATAN 0.02-0.005 % SOLN INSTILL 1 DROP INTO LEFT EYE AT BEDTIME   rosuvastatin (CRESTOR) 10 MG tablet Take 1 tablet (10 mg total) by mouth every other day.   White Petrolatum-Mineral Oil (CVS EYE LUBRICANT OP) Apply to eye.   [DISCONTINUED] Calcium Carbonate-Vitamin D 600-400 MG-UNIT tablet Take 2 tablets by mouth daily.   No facility-administered encounter medications on file as of 04/21/2021.    Patient Active Problem List   Diagnosis Date Noted   Depressed mood 04/14/2021   Primary osteoarthritis  of left knee 07/10/2020   Primary osteoarthritis of right knee 07/10/2020   OA (osteoarthritis) of knee 02/19/2020   Osteopenia 01/16/2020   Goiter 07/20/2019   Shoulder pain 05/03/2019   Hallux valgus (acquired), left foot 10/20/2018   Ptosis of left eyelid 10/20/2018   Vitamin D insufficiency 08/18/2018   Mild cognitive impairment with memory loss 03/13/2018   Visual impairment 03/13/2018   Skin abnormalities  09/21/2015   Chronic kidney disease, stage 3 ([place]) 02/17/2015   Glaucoma, left eye 01/22/2015   Prediabetes 11/20/2013   Healthcare maintenance 05/30/2012   Primary osteoarthritis of both knees 06/21/2011   Vitreous detachment 11/02/2010   HYPERCHOLESTEROLEMIA 11/17/2006   DEPRESSIVE DISORDER, NOS 10/06/2006   Elevated blood pressure reading in office with white coat syndrome, with diagnosis of hypertension 10/06/2006    Conditions to be addressed/monitored: Depression; Memory Deficits  Care Plan : Social Work  Updates made by Maurine Cane, LCSW since 04/21/2021 12:00 AM     Problem: Coping Skills / stress      Goal: Coping Skills Enhanced and symptoms of stress managed   Start Date: 08/13/2020  This Visit's Progress: On track  Recent Progress: Not on track  Priority: High  Note:   Current barriers:   Patient unable to use cell phone for virtual visits ( insurance will only pay 100% for virtual visits Unable to locate a mental health provider in-network with patient's insurance/ is concerned with side effects of Remeron and did not start taking Chronic medical concerns and Mental Health needs related memory loss and symptoms of depression. Currently experiencing symptoms of stress which seems to be exacerbated by her medical concerns and past trauma   Needs Support, Education, and Care Coordination in order to meet unmet need of managing her stress.   Clinical Interventions:  Assessed patient's needs, coping skills, support system and barriers to care Discussed and Identify effective and ineffective coping strategies.  Active listening; Motivational Interviewing, Solution-Focused Strategies, Emotional/Supportive Counseling, and Problem Solving  Verbalization of feelings encouraged; encouraged ongoing counseling due to hx of trauma and benefits of counseling. Collaborated with PCP for ongoing needs ( patient did not start taking medication(remeron) Collaborated [facility] in [place] with [facility]. Barriers for therapist  Inter-disciplinary care team collaboration (see longitudinal plan of care) Patient Goals/Self-Care Activities: Over the next 30 days Keep appointment with PCP to discuss your concerns about starting the medication I will also send her a message to her about your concerns of not starting the medication  Drink your ensure      Nataiya Czarnowski, [place] / [place]   [phone] 12:35 PM

## 2021-04-21 NOTE — Patient Instructions (Signed)
Visit Information   Goals Addressed             This Visit's Progress    Coping Skills Enhanced   On track    Timeframe:  Long-Range Goal Priority:  High Start Date:  08/13/20                           Expected End Date:                        Patient Goals/Self-Care Activities:  Keep appointment with PCP to discuss your concerns about starting the medication I will also send her a message to her about your concerns of not starting the medication  Drink your ensure       It was a pleasure speaking with you today. No follow up scheduled, per our conversation I will contact you in 2 to 3 weeks all the office if needed.  Patient verbalizes understanding of instructions provided today.   Casimer Lanius, LCSW Care Management & Coordination  [phone]

## 2021-04-22 ENCOUNTER — Ambulatory Visit
Admission: RE | Admit: 2021-04-22 | Discharge: 2021-04-22 | Disposition: A | Discharge status: Home / Self Care | Payer: PPO | Source: Ambulatory Visit | Attending: Family Medicine | Admitting: Family Medicine

## 2021-04-22 DIAGNOSIS — E042 Nontoxic multinodular goiter: Secondary | ICD-10-CM | POA: Diagnosis not present

## 2021-04-22 DIAGNOSIS — E049 Nontoxic goiter, unspecified: Secondary | ICD-10-CM

## 2021-04-29 NOTE — Progress Notes (Addendum)
    SUBJECTIVE:   CHIEF COMPLAINT / HPI: follow up decreased appetite   Review recent thyroid US  Results showed multinodular thyroid that has been present. No recommendation for  follow up at this time. Areas do not appear to have concerning findings.   Depressed Mood & decreased appetite   Patient has not been able to find a virtual therapist due to concern for copay as well as transportation issues. She was not amenable to starting mirtazepine due to concern for potential side effects,specifically concern for dizziness and worsened mood as she lives along. Patient continues to have feelings of loneliness and after further discussion would like to try mirtazepine while visiting with family to see how the medication may affect her. She reports that she continues to have decreased appetite but has been drinking 2-3 ensure drinks per day. Denies feeling nausea.    Memory Decline  Patietn reports she is having continued bouts of confusion and declining memory. Daughter reports that she is still able to function in her own apartment but that she helps with transportation and checks in on the patient regularly. They would like to wait before considering adding aricept to her medication list.   PERTINENT  PMH / PSH:  Cognitive Decline  White Coat HTN  Hypothyroidism  OBJECTIVE:   BP (!) 201/69   Pulse (!) 58   Wt 117 lb 3.2 oz (53.2 kg)   SpO2 100%   BMI 22.89 kg/m   General: female appearing stated age in no acute distress HEENT: goiter Cardio: Normal S1 and S2, no S3 or S4. Rhythm is regular. No murmurs or rubs.  Bilateral radial pulses palpable Pulm: Clear to auscultation bilaterally, no crackles, wheezing, or diminished breath sounds. Normal respiratory effort, stable on RA Abdomen: Bowel sounds normal. Abdomen soft and non-tender.  Extremities: No peripheral edema. Warm/ well perfused.  Neuro: pt alert and oriented x4    ASSESSMENT/PLAN:   Elevated blood pressure reading in  office with white coat syndrome, with diagnosis of hypertension Pt to continue BP medication regimen   Goiter Updated thyroid ultrasound with multinodular goiter, no recommendation for further surveillance nor biopsy  Continue thyroid medication  Mild cognitive impairment with memory loss Discussed aricept, patient and daughter prefer to hold off Information given on aVS for them to review the medication and follow up at next visit.     Eulis Foster, MD [place]

## 2021-04-30 ENCOUNTER — Other Ambulatory Visit: Payer: Self-pay

## 2021-04-30 ENCOUNTER — Ambulatory Visit (INDEPENDENT_AMBULATORY_CARE_PROVIDER_SITE_OTHER): Payer: PPO | Admitting: Family Medicine

## 2021-04-30 ENCOUNTER — Encounter: Payer: Self-pay | Admitting: Family Medicine

## 2021-04-30 DIAGNOSIS — G3184 Mild cognitive impairment, so stated: Secondary | ICD-10-CM

## 2021-04-30 DIAGNOSIS — I10 Essential (primary) hypertension: Secondary | ICD-10-CM | POA: Diagnosis not present

## 2021-04-30 DIAGNOSIS — E049 Nontoxic goiter, unspecified: Secondary | ICD-10-CM

## 2021-04-30 NOTE — Patient Instructions (Addendum)
Today we discussed starting the Mirtazepine while visiting with Meredith Owens to make sure their are no negative effects for you regarding dizziness.   Please follow up with me next month to see how your mood is doing.   Today we discussed memory concerns and starting a medication called Aricept. We will hold off on starting that today to not add multiple medications at once.     Donepezil Oral Dissolving Tablet What is this medication? DONEPEZIL (doe NEP e zil) is used to treat mild to moderate dementia caused by Alzheimer's disease. This medicine may be used for other purposes; ask your health care provider or pharmacist if you have questions. COMMON BRAND NAME(S): Aricept What should I tell my care team before I take this medication? They need to know if you have any of these conditions: asthma or other lung disease difficulty passing urine head injury heart disease history of irregular heartbeat liver disease seizures (convulsions) stomach or intestinal disease, ulcers or stomach bleeding an unusual or allergic reaction to donepezil, other medicines, foods, dyes, or preservatives pregnant or trying to get pregnant breast-feeding How should I use this medication? Take this medicine by mouth. Follow the directions on the prescription label. Place the tablet in the mouth and allow it to dissolve, then swallow. While you may take these tablets with water, it is not necessary to do so. You may take this medicine with or without food. Take your doses at regular intervals. This medicine is usually taken before bedtime. Do not take your medicine more often than directed. Continue to take your medicine even if you feel better. Do not stop taking except on the advice of your doctor or health care professional. Talk to your pediatrician regarding the use of this medicine in children. Special care may be needed. Overdosage: If you think you have taken too much of this medicine contact a poison control  center or emergency room at once. NOTE: This medicine is only for you. Do not share this medicine with others. What if I miss a dose? If you miss a dose, take it as soon as you can. If it is almost time for your next dose, take only that dose. Do not take double or extra doses. What may interact with this medication? Do not take this medicine with any of the following medications: certain medicines for fungal infections like itraconazole, fluconazole, posaconazole, and voriconazole cisapride dextromethorphan; quinidine dronedarone pimozide quinidine thioridazine This medicine may also interact with the following medications: antihistamines for allergy, cough and cold atropine bethanechol carbamazepine certain medicines for bladder problems like oxybutynin, tolterodine certain medicines for Parkinson's disease like benztropine, trihexyphenidyl certain medicines for stomach problems like dicyclomine, hyoscyamine certain medicines for travel sickness like scopolamine dexamethasone dofetilide ipratropium NSAIDs, medicines for pain and inflammation, like ibuprofen or naproxen other medicines for Alzheimer's disease other medicines that prolong the QT interval (cause an abnormal heart rhythm) phenobarbital phenytoin rifampin, rifabutin or rifapentine ziprasidone This list may not describe all possible interactions. Give your health care provider a list of all the medicines, herbs, non-prescription drugs, or dietary supplements you use. Also tell them if you smoke, drink alcohol, or use illegal drugs. Some items may interact with your medicine. What should I watch for while using this medication? Visit your doctor or health care professional for regular checks on your progress. Check with your doctor or health care professional if your symptoms do not get better or if they get worse. You may get drowsy or dizzy. Do not  drive, use machinery, or do anything that needs mental alertness until  you know how this drug affects you. What side effects may I notice from receiving this medication? Side effects that you should report to your doctor or health care professional as soon as possible: allergic reactions like skin rash, itching or hives, swelling of the face, lips, or tongue feeling faint or lightheaded, falls loss of bladder control seizures signs and symptoms of a dangerous change in heartbeat or heart rhythm like chest pain; dizziness; fast or irregular heartbeat; palpitations; feeling faint or lightheaded, falls; breathing problems signs and symptoms of infection like fever or chills; cough; sore throat; pain or trouble passing urine signs and symptoms of liver injury like dark yellow or brown urine; general ill feeling or flu-like symptoms; light-colored stools; loss of appetite; nausea; right upper belly pain; unusually weak or tired; yellowing of the eyes or skin slow heartbeat or palpitations unusual bleeding or bruising vomiting Side effects that usually do not require medical attention (report to your doctor or health care professional if they continue or are bothersome): diarrhea, especially when starting treatment headache loss of appetite muscle cramps nausea stomach upset This list may not describe all possible side effects. Call your doctor for medical advice about side effects. You may report side effects to FDA at 1-800-FDA-1088. Where should I keep my medication? Keep out of reach of children. Store at room temperature between 15 and 30 degrees C (59 and 86 degrees F). Throw away any unused medicine after the expiration date. NOTE: This sheet is a summary. It may not cover all possible information. If you have questions about this medicine, talk to your doctor, pharmacist, or health care provider.  2022 Elsevier/Gold Standard (2018-07-17 10:24:00)

## 2021-05-02 ENCOUNTER — Encounter: Payer: Self-pay | Admitting: Family Medicine

## 2021-05-02 ENCOUNTER — Other Ambulatory Visit: Payer: Self-pay | Admitting: Family Medicine

## 2021-05-02 NOTE — Assessment & Plan Note (Signed)
Updated thyroid ultrasound with multinodular goiter, no recommendation for further surveillance nor biopsy  Continue thyroid medication

## 2021-05-02 NOTE — Assessment & Plan Note (Signed)
Pt to continue BP medication regimen

## 2021-05-02 NOTE — Assessment & Plan Note (Signed)
Discussed aricept, patient and daughter prefer to hold off Information given on aVS for them to review the medication and follow up at next visit.

## 2021-05-11 ENCOUNTER — Ambulatory Visit: Payer: PPO | Admitting: Licensed Clinical Social Worker

## 2021-05-11 DIAGNOSIS — Z7689 Persons encountering health services in other specified circumstances: Secondary | ICD-10-CM

## 2021-05-11 NOTE — Chronic Care Management (AMB) (Signed)
Care Management Clinical Social Work Note  05/11/2021 Name: Meredith Owens MRN: [id number] DOB: 1941/07/05  Meredith Owens is a 80 y.o. year old female who is a primary care patient of Simmons-Robinson, Makiera, MD.  The Care Management team was consulted for assistance with chronic disease management and coordination needs.  Consent to Services:  The patient was given information about Care Management services, agreed to services, and gave verbal consent prior to initiation of services.  Please see initial visit note for detailed documentation.   Patient agreed to services today and consent obtained.   Assessment: Engaged with patient by phone in response to provider referral for social work care coordination services: [facility].   She is making progress with connecting with a therapist, reports appointment scheduled 05/20/21 with [facility] . See Care Plan below for interventions and patient self-care actives.  Follow up Plan: No follow up scheduled with CCM LCSW at this time. Will follow up with patient in 90 days as needed .Patient will call office if needed prior to next encounter. CCM LCSW will continue to collaborate with PCP and other care team members in order to meet patient's needs .    Review of patient past medical history, allergies, medications, and health status, including review of relevant consultants reports was performed today as part of a comprehensive evaluation and provision of chronic care management and care coordination services.  SDOH (Social Determinants of Health) assessments and interventions performed:    Advanced Directives Status: See Vynca application for related entries.  Care Plan  Allergies  Allergen Reactions   Brimonidine Other (See Comments)    Red eyes   Oxycodone Hcl     REACTION: Hives   Prochlorperazine Edisylate     REACTION: anaphylaxis   Latex Itching and Rash    Outpatient Encounter Medications as  of 05/11/2021  Medication Sig   valsartan (DIOVAN) 80 MG tablet TAKE 1 TABLET BY MOUTH EVERY DAY   acetaminophen (TYLENOL 8 HOUR) 650 MG CR tablet Take 1 tablet (650 mg total) by mouth every 8 (eight) hours as needed for pain.   aspirin 81 MG tablet Take 81 mg by mouth daily.   carvedilol (COREG) 12.5 MG tablet TAKE 1 TABLET BY MOUTH 2 TIMES DAILY.   [employer] D3 50 MCG (2000 UT) CAPS TAKE 1 CAPSULE BY MOUTH EVERY DAY   cycloSPORINE (RESTASIS OP) Apply to eye.   diclofenac sodium (VOLTAREN) 1 % GEL Apply 4 g topically 4 (four) times daily.   dorzolamide-timolol (COSOPT) 22.3-6.8 MG/ML ophthalmic solution 1 drop 2 (two) times daily.   hydrALAZINE (APRESOLINE) 10 MG tablet TAKE 1 TABLET BY MOUTH THREE TIMES A DAY   mirtazapine (REMERON) 7.5 MG tablet TAKE 1 TABLET BY MOUTH AT BEDTIME.   RESTASIS 0.05 % ophthalmic emulsion    ROCKLATAN 0.02-0.005 % SOLN INSTILL 1 DROP INTO LEFT EYE AT BEDTIME   rosuvastatin (CRESTOR) 10 MG tablet Take 1 tablet (10 mg total) by mouth every other day.   White Petrolatum-Mineral Oil (CVS EYE LUBRICANT OP) Apply to eye.   [DISCONTINUED] Calcium Carbonate-Vitamin D 600-400 MG-UNIT tablet Take 2 tablets by mouth daily.   No facility-administered encounter medications on file as of 05/11/2021.    Patient Active Problem List   Diagnosis Date Noted   Depressed mood 04/14/2021   Primary osteoarthritis of left knee 07/10/2020   Primary osteoarthritis of right knee 07/10/2020   OA (osteoarthritis) of knee 02/19/2020   Osteopenia 01/16/2020   Goiter 07/20/2019  Shoulder pain 05/03/2019   Hallux valgus (acquired), left foot 10/20/2018   Ptosis of left eyelid 10/20/2018   Vitamin D insufficiency 08/18/2018   Mild cognitive impairment with memory loss 03/13/2018   Visual impairment 03/13/2018   Skin abnormalities 09/21/2015   Chronic kidney disease, stage 3 ([place]) 02/17/2015   Glaucoma, left eye 01/22/2015   Prediabetes 11/20/2013   Healthcare maintenance 05/30/2012    Primary osteoarthritis of both knees 06/21/2011   Vitreous detachment 11/02/2010   HYPERCHOLESTEROLEMIA 11/17/2006   DEPRESSIVE DISORDER, NOS 10/06/2006   Elevated blood pressure reading in office with white coat syndrome, with diagnosis of hypertension 10/06/2006    Conditions to be addressed/monitored: Depression;   Care Plan : Social Work  Updates made by Maurine Cane, LCSW since 05/11/2021 12:00 AM     Problem: Coping Skills / stress      Goal: Coping Skills Enhanced and symptoms of stress managed   Start Date: 08/13/2020  This Visit's Progress: On track  Recent Progress: On track  Priority: High  Note:   Current barriers:   Patient unable to use cell phone for virtual visits ( insurance will only pay 100% for virtual visits Unable to locate a mental health provider in-network with patient's insurance/ is concerned with side effects of Remeron and did not start taking Chronic medical concerns and Mental Health needs related memory loss and symptoms of depression. Currently experiencing symptoms of stress which seems to be exacerbated by her medical concerns and past trauma   Needs Support, Education, and Care Coordination in order to meet unmet need of managing her stress.   Clinical Interventions:  Assessed patient's needs, coping skills, support system and barriers to care Discussed and Identify effective and ineffective coping strategies.  Active listening; Motivational Interviewing, Solution-Focused Strategies, Emotional/Supportive Counseling, and Problem Solving  Verbalization of feelings encouraged; encouraged ongoing counseling due to hx of trauma and benefits of counseling. Collaborated with PCP for ongoing needs ( patient did not start taking medication(remeron) [facility] in [place] ( appointment scheduled 05/20/2021 Inter-disciplinary care team collaboration (see longitudinal plan of care) Patient Goals/Self-Care Activities: Over the  next 30 days Keep appointment with PCP to discuss your concerns about starting the medication Keep appointment with [facility] I will also send her a message to her about your concerns of not starting the medication  Drink your ensure     Casimer Lanius, [place] / [place]   [phone]

## 2021-05-11 NOTE — Patient Instructions (Signed)
Visit Information   Goals Addressed             This Visit's Progress    Coping Skills Enhanced   On track    Timeframe:  Long-Range Goal Priority:  High Start Date:  08/13/20                           Expected End Date:                        Patient Goals/Self-Care Activities:  Keep appointment with PCP to discuss your concerns about starting the medication Keep appointment with [facility] I will also send her a message to her about your concerns of not starting the medication  Drink your ensure       It was a pleasure speaking with you today. Please call the office if needed No follow up scheduled, per our conversation I will contact you in 90 days as needed.  Patient verbalizes understanding of instructions provided today.   Casimer Lanius, LCSW Care Management & Coordination  [phone]

## 2021-05-20 ENCOUNTER — Ambulatory Visit (HOSPITAL_COMMUNITY): Payer: Self-pay | Admitting: Psychiatry

## 2021-05-28 ENCOUNTER — Ambulatory Visit: Payer: PPO | Admitting: Licensed Clinical Social Worker

## 2021-05-28 DIAGNOSIS — Z7689 Persons encountering health services in other specified circumstances: Secondary | ICD-10-CM

## 2021-05-28 NOTE — Chronic Care Management (AMB) (Signed)
Care Management  Clinical Social Work Note  05/28/2021 Name: Meredith Owens MRN: [id number] DOB: 19-Jan-1941  Meredith Owens is a 80 y.o. year old female who is a primary care patient of Simmons-Robinson, Riki Sheer, MD. The CCM team was consulted for assistance with care coordination needs.  Connect for ongoing counseling.    Consent to Services:  The patient was given information about Care Management services, agreed to services, and gave verbal consent prior to initiation of services.  Please see initial visit note for detailed documentation.   Patient agreed to services today and consent obtained.  Engaged with patient by telephone for follow up visit in response to provider referral for social work care coordination services.   Assessment/Interventions: Assessed patient's current treatment, progress, coping skills, support system and barriers to care.  She continues to experience difficulty with managing emotions with symptoms of depression. Reports difficulty with letting go of the past and these thoughts continue to cause her pain . See Care Plan below for interventions and patient self-care actives.  Recent life changes or stressors: preparing to move in with daughter ( has not date or time frame)  Recommendation: Patient may benefit from, but is in agreement to explore the PACE program for all inclusive care.  States will consider this later.   Follow up Plan: Patient would like continued follow-up from CCM LCSW .  per patient's request will follow up in 30 to 60 days.  Will call office if needed prior to next encounter.   Review of patient past medical history, allergies, medications, and health status, including review of pertinent consultant reports was performed as part of comprehensive evaluation and provision of care management/care coordination services.   SDOH (Social Determinants of Health) screening and interventions performed today:   Advanced Directives Status:See Vynca  application for related entries.     Care Plan    Conditions to be addressed/monitored per PCP order: Depression, Memory Deficits  Care Plan : Social Work  Updates made by Maurine Cane, LCSW since 05/28/2021 12:00 AM     Problem: Coping Skills / stress      Goal: Coping Skills Enhanced and symptoms of stress managed   Start Date: 08/13/2020  This Visit's Progress: On track  Recent Progress: On track  Priority: High  Note:   Current barriers:   Patient unable to use cell phone for virtual visits ( insurance will only pay 100% for virtual visits is concerned with side effects of Remeron and did not start taking Chronic medical concerns and Mental Health needs related memory loss and symptoms of depression. Currently experiencing symptoms of stress which seems to be exacerbated by her medical concerns and past trauma   Needs Support, Education, and Care Coordination in order to meet unmet need of managing her stress.   Clinical Interventions:  Discussed and Identify effective and ineffective coping strategies.  Active listening; Motivational Interviewing, Solution-Focused Strategies, Emotional/Supportive Counseling, and Problem Solving  Verbalization of feelings encouraged; encouraged ongoing counseling due to hx of trauma and benefits of counseling. Collaborated with PCP for ongoing needs ( patient did not start taking medication(remeron) [facility] in [place] ( appointment scheduled 06/04/2021 Inter-disciplinary care team collaboration (see longitudinal plan of care) Patient Goals/Self-Care Activities: Over the next 30 days Keep appointment with [facility] on 06/04/2021    Casimer Lanius, [place] / [place]   [phone]

## 2021-05-28 NOTE — Patient Instructions (Signed)
Visit Information   Goals Addressed             This Visit's Progress    Coping Skills Enhanced   On track    Timeframe:  Long-Range Goal Priority:  High Start Date:  08/13/20                           Expected End Date:                        Patient Goals/Self-Care Activities:  Keep appointment with [facility] on 06/04/2021       It was a pleasure speaking with you today. Please call the office if needed No follow up scheduled, per our conversation I will contact you in 30 to 60 days.  Patient verbalizes understanding of instructions provided today.   Casimer Lanius, LCSW Care Management & Coordination  [phone]

## 2021-06-04 ENCOUNTER — Encounter (HOSPITAL_COMMUNITY): Payer: Self-pay | Admitting: Psychiatry

## 2021-06-04 ENCOUNTER — Ambulatory Visit (INDEPENDENT_AMBULATORY_CARE_PROVIDER_SITE_OTHER): Payer: PPO | Admitting: Psychiatry

## 2021-06-04 ENCOUNTER — Other Ambulatory Visit: Payer: Self-pay

## 2021-06-04 DIAGNOSIS — F321 Major depressive disorder, single episode, moderate: Secondary | ICD-10-CM
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
## 2021-06-08 IMAGING — CR DG SHOULDER 2+V*L*
3 series · 3 of 3 positions shown · non-contrast
Comparison: None.

CLINICAL DATA: Left shoulder pain with limited range of motion.

EXAM:
LEFT SHOULDER - 2+ VIEW

[w shoulder grashey left]
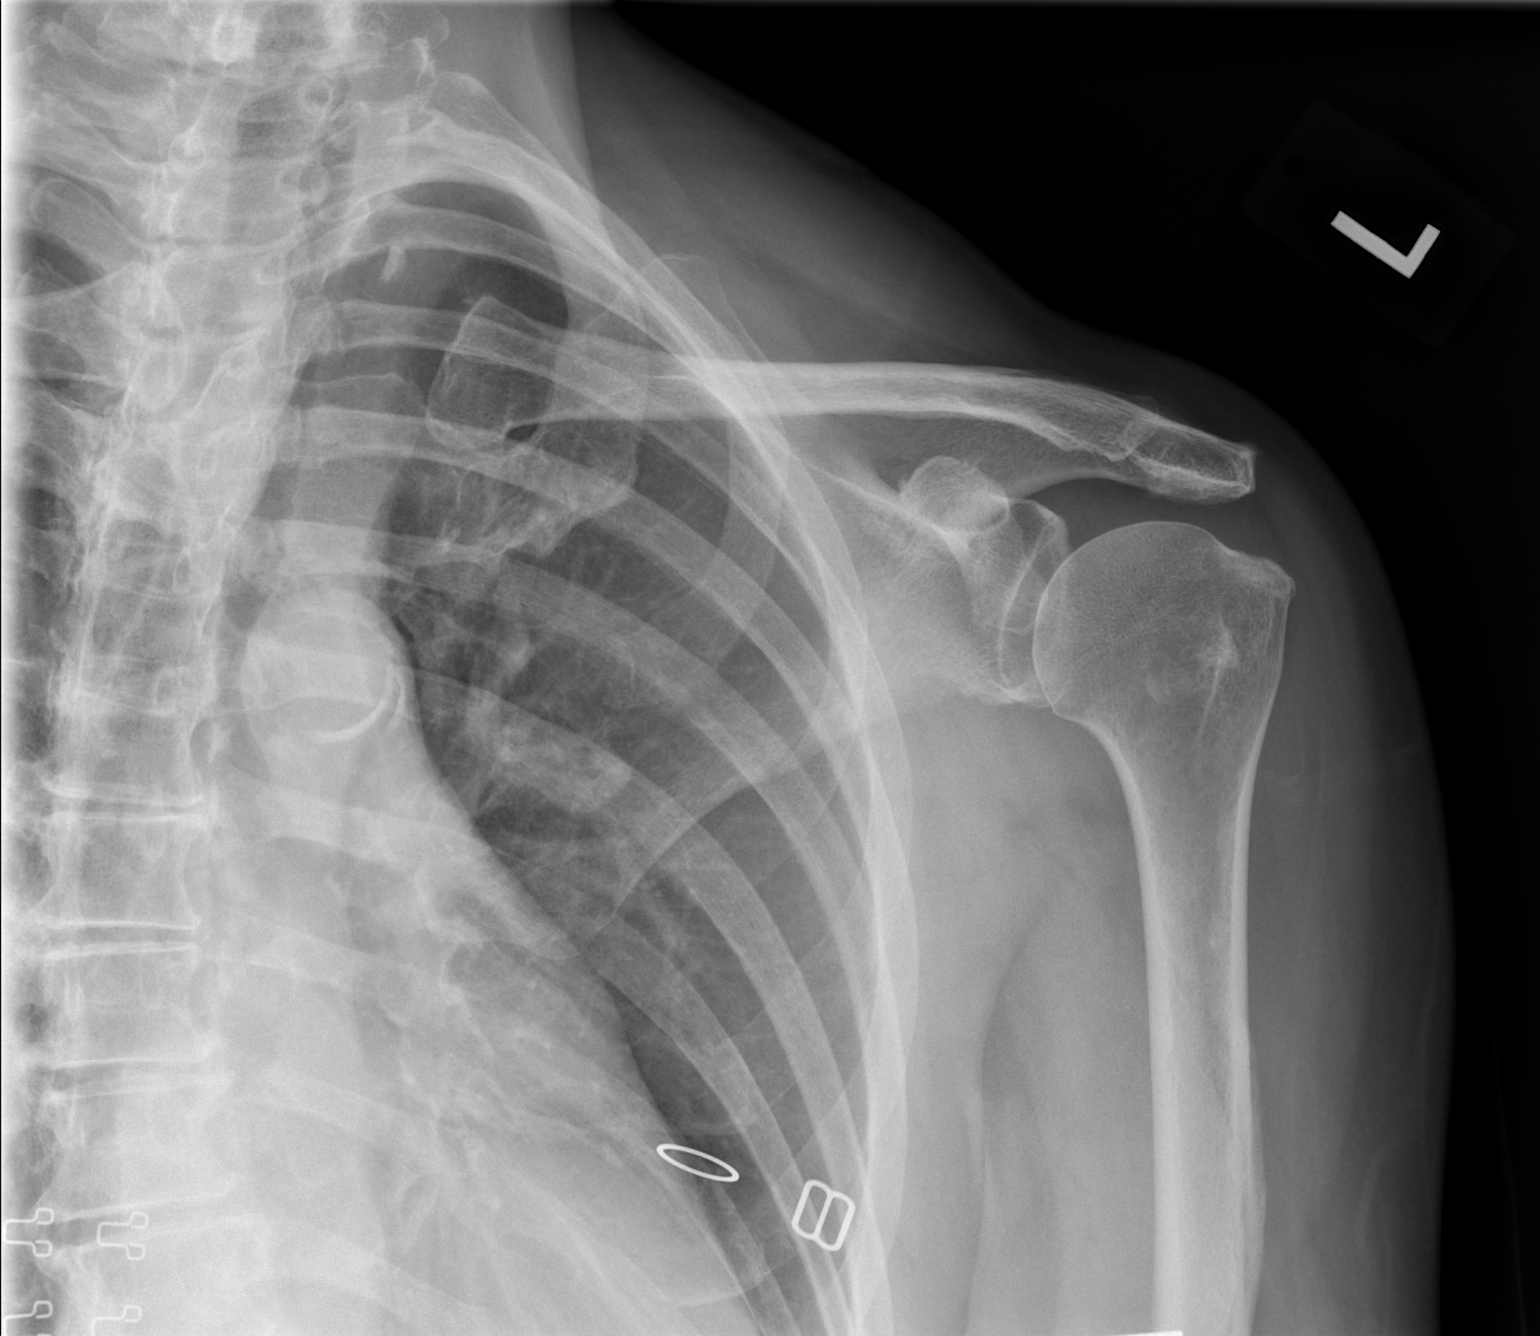

[w shoulder y-view left]
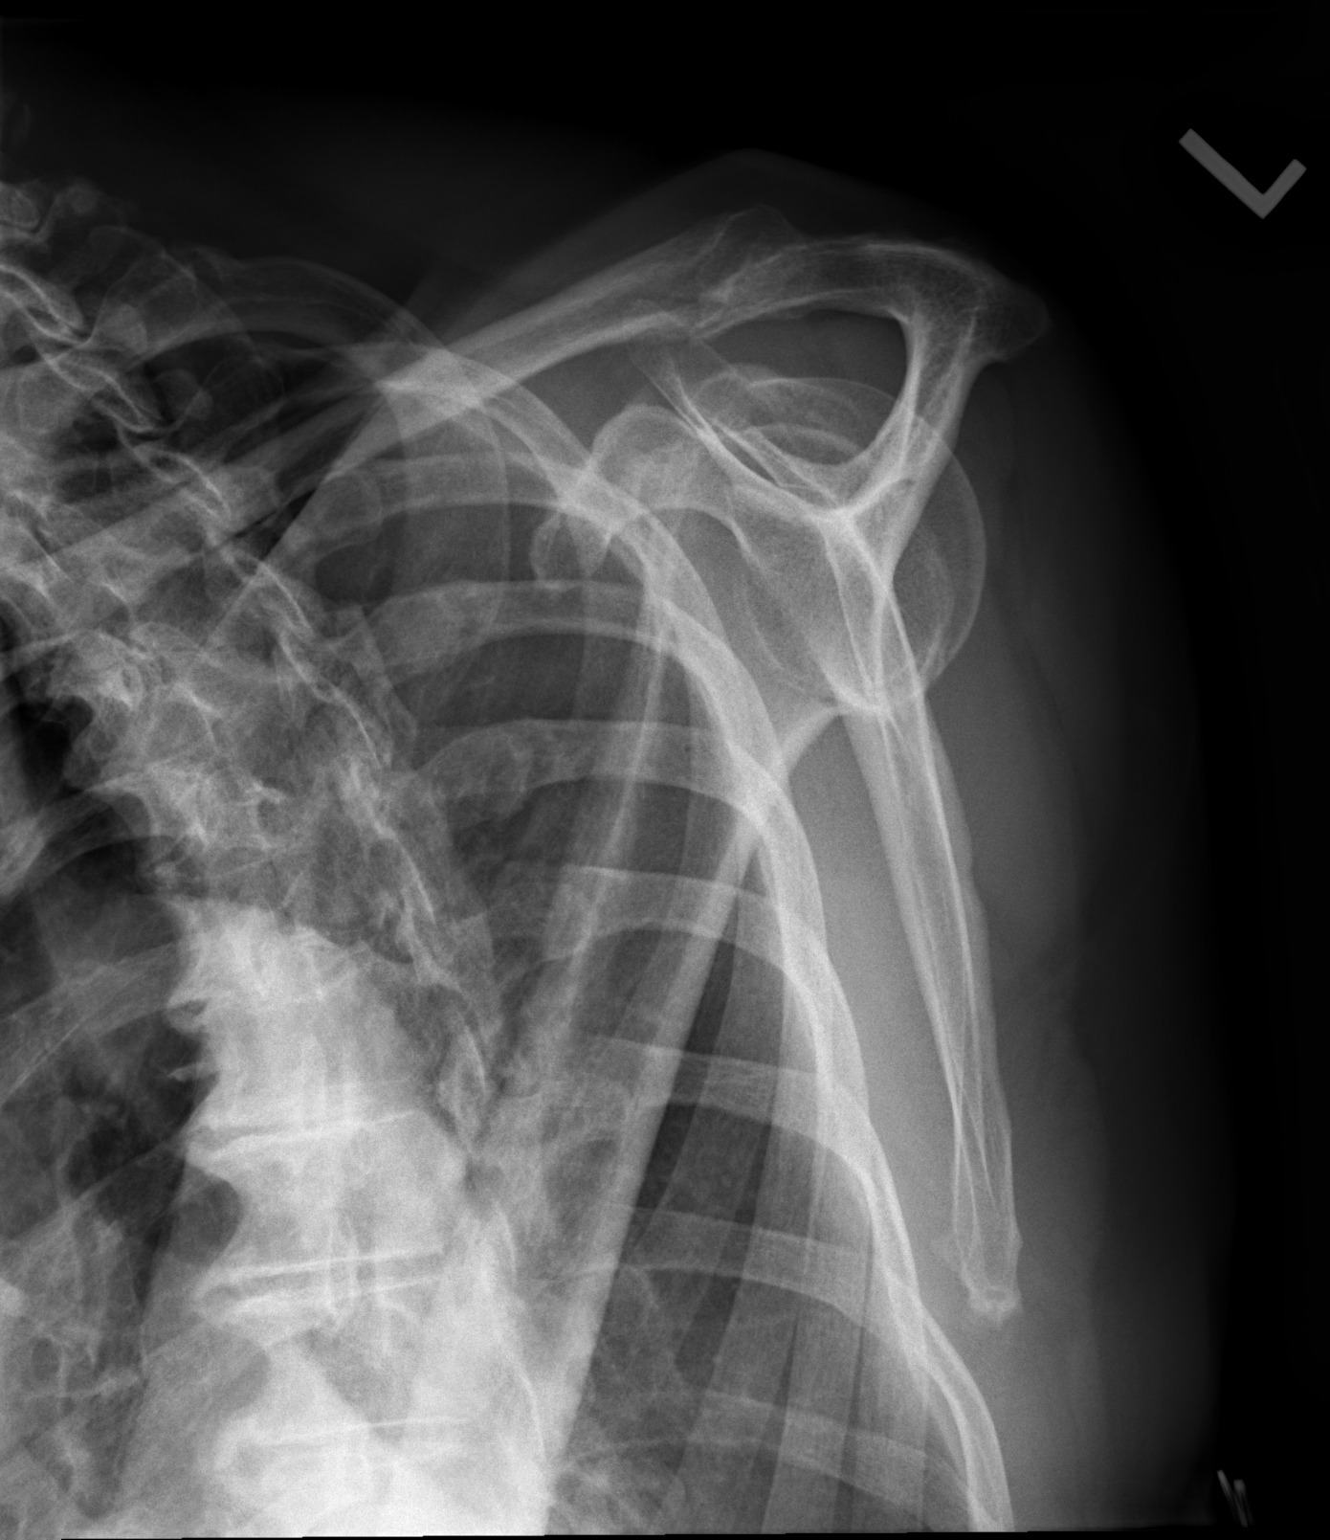

[w shoulder axillary left]
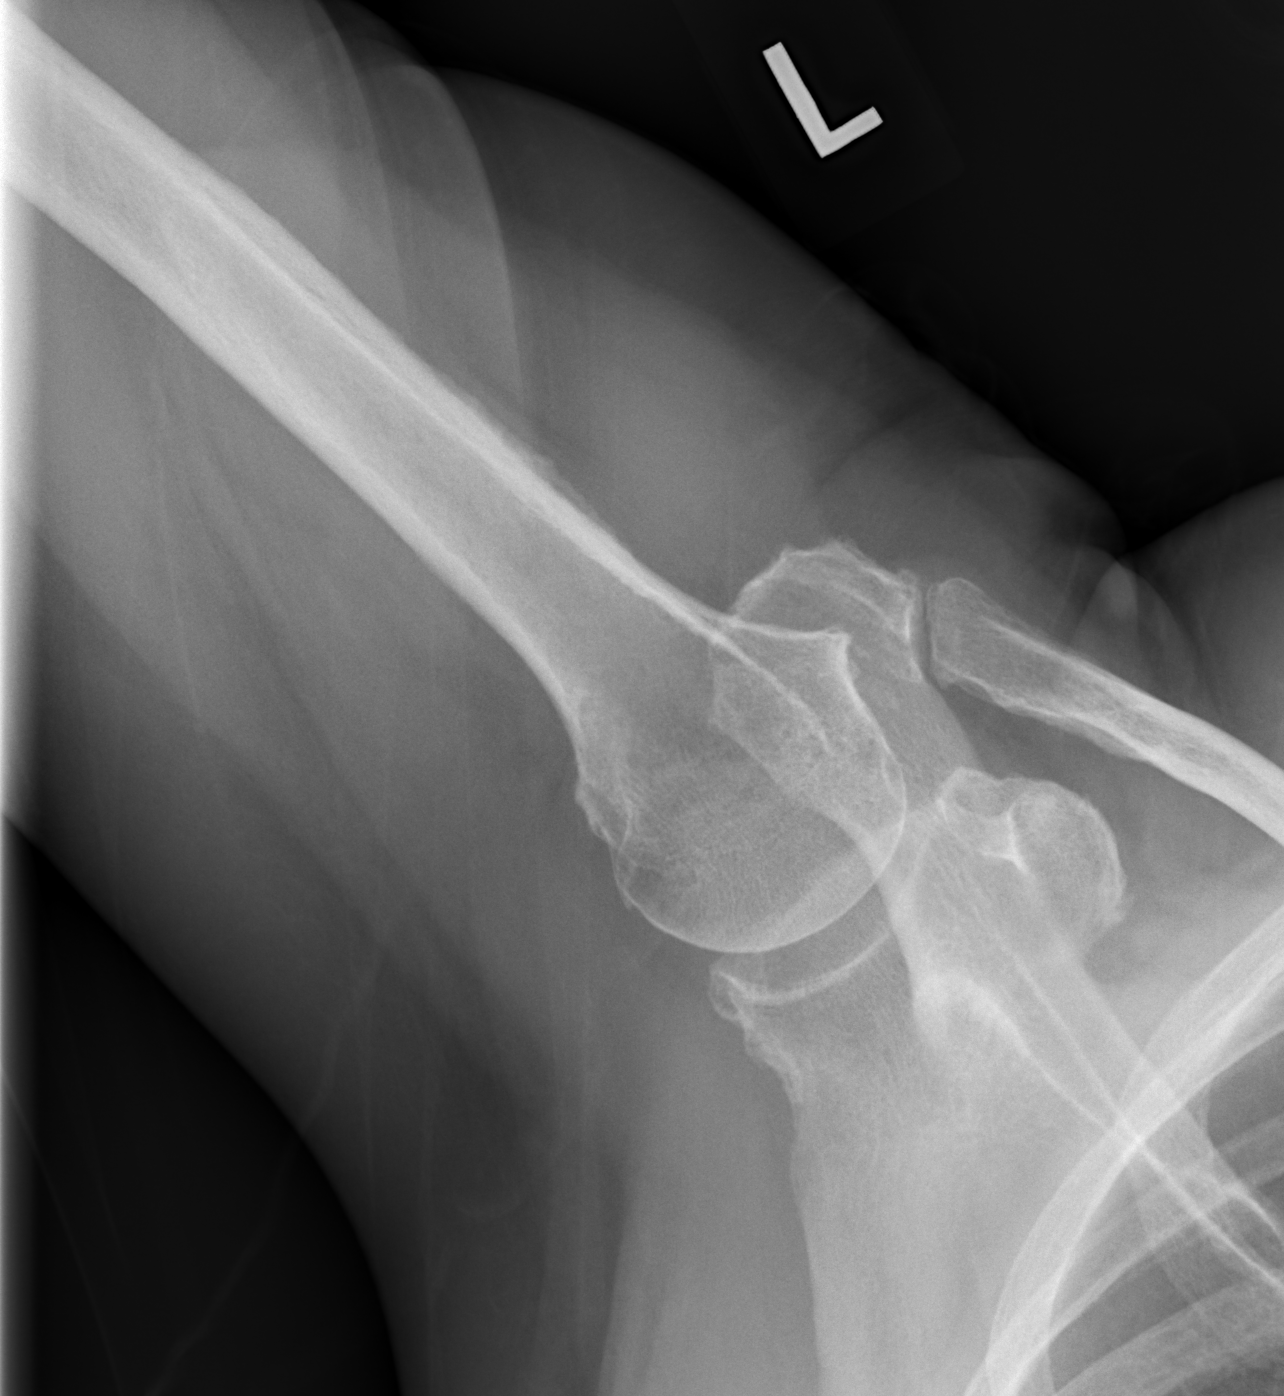

[3 of 3 positions shown; findings below may reference images not displayed]

FINDINGS: There is no evidence of fracture or dislocation. There is no
evidence of arthropathy or other focal bone abnormality. Soft
tissues are unremarkable.

Atherosclerotic aortic arch.
IMPRESSION: Negative.

## 2021-06-25 ENCOUNTER — Other Ambulatory Visit: Payer: Self-pay

## 2021-06-25 ENCOUNTER — Ambulatory Visit (INDEPENDENT_AMBULATORY_CARE_PROVIDER_SITE_OTHER): Payer: PPO | Admitting: Psychiatry

## 2021-06-25 DIAGNOSIS — F321 Major depressive disorder, single episode, moderate: Secondary | ICD-10-CM

## 2021-06-25 NOTE — Progress Notes (Signed)
Virtual Visit via Telephone Note  I connected with Meredith Owens on 06/25/21 at 10:10 AM EST by telephone and verified that I am speaking with the correct person using two identifiers.  Location: Patient: Home Provider: [place] office    I discussed the limitations, risks, security and privacy concerns of performing an evaluation and management service by telephone and the availability of in person appointments. I also discussed with the patient that there may be a patient responsible charge related to this service. The patient expressed understanding and agreed to proceed.   I provided 45 minutes of non-face-to-face time during this encounter.   Alonza Smoker, LCSW    THERAPIST PROGRESS NOTE  Session Time: Thursday 06/25/2021 10:10 AM - 10:55 AM   Participation Level: Active  Behavioral Response: AlertAnxious  Type of Therapy: Individual Therapy  Treatment Goals addressed: Establish therapeutic alliance, learn and implement cognitive and behavioral strategies to cope with anxiety and stress  Interventions: CBT and Supportive  Summary: Meredith Owens is a 80 y.o. female who is referred for services by LCSW Casimer Lanius due to patient experiencing symptoms of depression as well as anxiety.  She denies any psychiatric hospitalizations or involvement in outpatient therapy.  Patient states being concerned about her physical health particularly weight loss, her mental health as she forgets things, and dental health.  Patient states living by herself and feeling alone.  She also reports excessive worry, feelings of hopelessness, worthlessness, fatigue, and tearfulness.  Patient last was seen via virtual visit about 2 weeks ago for the assessment appointment.  Patient continues to experience symptoms of anxiety and depression including excessive worry and sadness.  She reports a pattern of worrying about such things as paying her bills on time.  Per patient's report, she  has no other means to pay her bills other than mailing them.  She expresses anxiety and worry that bills will not get to destination on time.  She also reports loneliness as she lives alone.  She reports good support from family and friends but reports not having as much physical face-to-face contact as she would like.  Per patient's report, this started during the pandemic.  She is beginning to have more contact with people but it remains mainly via phone.  Suicidal/Homicidal: Nowithout intent/plan  Therapist Response: Reviewed symptoms, discussed stressors, facilitated expression of thoughts and feelings, validated feelings, gathered more information from patient regarding resources and support system, assisted patient with problem solving regarding exploring possible options to ensure bills are paid on time such as automatic bank draft, developed plan with patient to research options with bank or creditors, began to discuss the role of behavioral activation and explored ways to begin to increase behavioral activation.  Encourage patient to maintain current social involvement including telephone contact with family and friends and participating in events in community room in apartment complex  Plan: Return again in 2 weeks.  Diagnosis: Axis I: Major depressive disorder, single episode, moderate       Alonza Smoker, LCSW 06/25/2021

## 2021-06-30 NOTE — Progress Notes (Addendum)
SUBJECTIVE:   CHIEF COMPLAINT / HPI: f/u for mood   Patient was able to establish with behavioral health on 11/17. Today she reports was able to establish with a behavioral therapist.  She reports that her symptoms are well.  She is expecting to follow-up with her provider in the next 2 weeks.  She reports that it has helped with her mood.  Patient reports tragically losing 2 siblings earlier in her life and appreciates benefit to discuss patient's issues as they continue to affect her mood.  Hypertension Patient continues to be hypertensive in office today.  Reports adherence to her medication regimen.  She states that she has had right-sided pain episodes but denies this today.  She denies any difficulty breathing.  She denies any lower extremity swelling.  Right lower extremity nodule Patient states that in the last 3 weeks she has noticed a soft but tender posterior knee nodule.  She states that it is especially tender when wearing tight clothes.  She states that her mother had history of lung cancer and was a longtime smoker.  Patient denies any personal history of cancers.  Patient states that the tender area does not impede her ability to ambulate.  She denies any fevers or chills since noticing nausea.  She has not tried any ice or heat or medications to treat the soreness.  PERTINENT  PMH / PSH:  White coat HTN  Goiter  OA, bilateral   OBJECTIVE:   BP (!) 178/72   Pulse 71   Ht 5' (1.524 m)   Wt 118 lb 6.4 oz (53.7 kg)   SpO2 98%   BMI 23.12 kg/m   Physical Exam Constitutional:      Appearance: Normal appearance.  Eyes:     Conjunctiva/sclera: Conjunctivae normal.     Comments: Left eye movements normal, right eye prosthesis   Cardiovascular:     Rate and Rhythm: Normal rate and regular rhythm.     Pulses: Normal pulses.     Heart sounds: Normal heart sounds.  Pulmonary:     Effort: Pulmonary effort is normal.     Breath sounds: No wheezing, rhonchi or rales.   Abdominal:     General: There is no distension.     Palpations: Abdomen is soft.     Tenderness: There is no abdominal tenderness.  Musculoskeletal:     Right lower leg: No edema.     Left lower leg: No edema.     Comments: Right posterior soft, tender nodule, no overlying erythema, superficial   Neurological:     Mental Status: She is alert.     ASSESSMENT/PLAN:   Elevated blood pressure reading in office with white coat syndrome, with diagnosis of hypertension Recheck BP remains elevated 130 systolic Patient asymptomatic  Will recommend for ambulatory BP monitoring Likely will need adjustment to antihypertensive regimen depending on results of ambulatory monitoring  Continue hydralazine, valsartan and BB   Hyperplastic colonic polyp Will need repeat colonoscopy per previous results Referral to gastroenterology  Depressed mood Patient reports establishing with behavioral health and in therapy virtually  Would like to continue with sessions  Patient does not wish to start mirtazapine as previously prescribed   Soft tissue swelling of knee joint Soft swelling near medial, posterior aspect of right knee joint, high suspicion for baker's cyst given hx of OA in bilateral knees along with reported tenderness. Will refer to sports medicine for ultrasound and potential drainage if baker's cyst is true diagnosis.  Eulis Foster, MD [place]

## 2021-07-01 ENCOUNTER — Other Ambulatory Visit: Payer: Self-pay

## 2021-07-01 ENCOUNTER — Encounter: Payer: Self-pay | Admitting: Family Medicine

## 2021-07-01 ENCOUNTER — Ambulatory Visit (INDEPENDENT_AMBULATORY_CARE_PROVIDER_SITE_OTHER): Payer: PPO | Admitting: Family Medicine

## 2021-07-01 VITALS — BP 178/72 | HR 71 | Ht 60.0 in | Wt 118.4 lb

## 2021-07-01 DIAGNOSIS — M25461 Effusion, right knee: Secondary | ICD-10-CM | POA: Diagnosis not present

## 2021-07-01 DIAGNOSIS — K635 Polyp of colon: Secondary | ICD-10-CM | POA: Diagnosis not present

## 2021-07-01 DIAGNOSIS — I10 Essential (primary) hypertension: Secondary | ICD-10-CM | POA: Diagnosis not present

## 2021-07-01 DIAGNOSIS — R4589 Other symptoms and signs involving emotional state: Secondary | ICD-10-CM

## 2021-07-01 DIAGNOSIS — M25469 Effusion, unspecified knee: Secondary | ICD-10-CM

## 2021-07-01 DIAGNOSIS — M25561 Pain in right knee: Secondary | ICD-10-CM | POA: Diagnosis not present

## 2021-07-01 NOTE — Patient Instructions (Signed)
I have submitted referrals for a follow up colonoscopy as well as sports medicine for the swollen area on your knee.   Please be on the lookout for a call to schedule these appointments.   Please follow up with me in 2-3 weeks to check your blood pressure and mood check.

## 2021-07-05 DIAGNOSIS — M25469 Effusion, unspecified knee: Secondary | ICD-10-CM | POA: Insufficient documentation

## 2021-07-05 NOTE — Assessment & Plan Note (Signed)
Recheck BP remains elevated 410 systolic Patient asymptomatic  Will recommend for ambulatory BP monitoring Likely will need adjustment to antihypertensive regimen depending on results of ambulatory monitoring  Continue hydralazine, valsartan and BB

## 2021-07-05 NOTE — Assessment & Plan Note (Signed)
Soft swelling near medial, posterior aspect of right knee joint, high suspicion for baker's cyst given hx of OA in bilateral knees along with reported tenderness. Will refer to sports medicine for ultrasound and potential drainage if baker's cyst is true diagnosis.

## 2021-07-05 NOTE — Assessment & Plan Note (Signed)
Will need repeat colonoscopy per previous results Referral to gastroenterology

## 2021-07-05 NOTE — Assessment & Plan Note (Signed)
Patient reports establishing with behavioral health and in therapy virtually  Would like to continue with sessions  Patient does not wish to start mirtazapine as previously prescribed

## 2021-07-09 ENCOUNTER — Other Ambulatory Visit: Payer: Self-pay

## 2021-07-09 ENCOUNTER — Telehealth: Payer: Self-pay

## 2021-07-09 ENCOUNTER — Ambulatory Visit (INDEPENDENT_AMBULATORY_CARE_PROVIDER_SITE_OTHER): Payer: PPO | Admitting: Psychiatry

## 2021-07-09 ENCOUNTER — Encounter (HOSPITAL_COMMUNITY): Payer: Self-pay

## 2021-07-09 DIAGNOSIS — F321 Major depressive disorder, single episode, moderate: Secondary | ICD-10-CM | POA: Diagnosis not present

## 2021-07-09 NOTE — Plan of Care (Signed)
Pt participated in development of plan

## 2021-07-09 NOTE — Telephone Encounter (Signed)
Patient calls nurse line reporting she a missed a call to schedule an apt with Koval. Per last office visit patient is to come in for ambulatory BP.   Patient reports she has issues with transportation and coming in on 12/5 (tentative BP apt) and 12/7 (PCP apt) is not feasible for her. Patient is wanting to know if she can be set up with 24 hour BP cuff at PCP apt on 12/7.   Patient advised she would have to bring the cuff back to our office regardless. Patient reports she can arrange a ride to return cuff.   Will forward to PCP and Koval for advisement.

## 2021-07-09 NOTE — Progress Notes (Signed)
Virtual Visit via Telephone Note  I connected with Meredith Owens on 07/09/21 at 10:12 AM EST by telephone and verified that I am speaking with the correct person using two identifiers.  Location: Patient: Home Provider: [place] office    I discussed the limitations, risks, security and privacy concerns of performing an evaluation and management service by telephone and the availability of in person appointments. I also discussed with the patient that there may be a patient responsible charge related to this service. The patient expressed understanding and agreed to proceed.   I provided 48 minutes of non-face-to-face time during this encounter.   Alonza Smoker, LCSW    THERAPIST PROGRESS NOTE  Session Time: Thursday 07/09/2021 10:12 AM - 11:00 AM   Participation Level: Active  Behavioral Response: AlertAnxious  Type of Therapy: Individual Therapy  Treatment Goals addressed: Establish therapeutic alliance, learn and implement cognitive and behavioral strategies to cope with anxiety and stress  Interventions: CBT and Supportive  Summary: Meredith Owens is a 80 y.o. female who is referred for services by LCSW Casimer Lanius due to patient experiencing symptoms of depression as well as anxiety.  She denies any psychiatric hospitalizations or involvement in outpatient therapy.  Patient states being concerned about her physical health particularly weight loss, her mental health as she forgets things, and dental health.  Patient states living by herself and feeling alone.  She also reports excessive worry, feelings of hopelessness, worthlessness, fatigue, and tearfulness.  Patient last was seen via virtual visit about 2 weeks ago.  Patient reports improved mood and increased socialization since last session.  Per patient's report, she has spent much more time with family especially during the Thanksgiving holiday.  Patient a reports one of her daughter's asked patient about  a year ago to move in with her and her family when they build their new home.  Per patient's report, her daughter took her to their home site recently where the house now is being built.  Patient reports being very excited about this and now thinking differently about currently living alone.  No date has been set for a move but patient is much more hopeful as she knows she eventually will live with her daughter.    Suicidal/Homicidal: Nowithout intent/plan  Therapist Response: Reviewed symptoms, discussed patient's involvement with family over the holidays, discussed effects on patient, developed treatment plan, obtained patient's participation and verbal consent to treatment plan, processed patient's feelings about moving in with daughter and her family, began to assist patient identify connection between thoughts/mood/and behavior, assisted patient began to examine her thoughts when feeling lonely, began to identify ways to reframe unhelpful thoughts, developed plan with patient to write down helpful replacement statement and to use replacement statement between sessions   Plan: Return again in 2 weeks.  Diagnosis: Axis I: Major depressive disorder, single episode, moderate       Alonza Smoker, LCSW 07/09/2021

## 2021-07-10 NOTE — Telephone Encounter (Signed)
I called patient and she is agreeable to this plan.  I tried to schedule on Meredith Owens schedule, however there are no more available times that will work. Patient can not come at 230, she has an apt at sports medicine.   I can add a spot, however wanted to double check first.   Thanks!

## 2021-07-13 NOTE — Telephone Encounter (Signed)
Scheduled

## 2021-07-15 ENCOUNTER — Other Ambulatory Visit: Payer: Self-pay

## 2021-07-15 ENCOUNTER — Ambulatory Visit: Payer: PPO | Admitting: Pharmacist

## 2021-07-15 ENCOUNTER — Ambulatory Visit (INDEPENDENT_AMBULATORY_CARE_PROVIDER_SITE_OTHER): Payer: PPO | Admitting: Family Medicine

## 2021-07-15 ENCOUNTER — Ambulatory Visit: Payer: PPO | Admitting: Family Medicine

## 2021-07-15 ENCOUNTER — Encounter: Payer: Self-pay | Admitting: Family Medicine

## 2021-07-15 DIAGNOSIS — I10 Essential (primary) hypertension: Secondary | ICD-10-CM | POA: Diagnosis not present

## 2021-07-15 NOTE — Patient Instructions (Addendum)
I will follow up with the recommendations from your appointment with sports medicine concerning the area behind your knee.  For your blood pressure, I will follow up with the pharmacy team's measurement of your blood pressure when you are able to get it recorded.   Please continue your blood pressure medications as prescribed for now, we can discuss any necessary changes after your monitoring is complete.  Please continue to follow up with [name] for your scheduled meetings.   Meredith Owens will sign off for now, thank you for allowing her to help in your care the past few months.

## 2021-07-16 ENCOUNTER — Ambulatory Visit: Payer: Self-pay | Admitting: Licensed Clinical Social Worker

## 2021-07-16 DIAGNOSIS — R4589 Other symptoms and signs involving emotional state: Secondary | ICD-10-CM

## 2021-07-16 DIAGNOSIS — G3184 Mild cognitive impairment, so stated: Secondary | ICD-10-CM

## 2021-07-16 NOTE — Patient Instructions (Signed)
   LCSW will disconnect from patient's care team at this time, but will be available at any time patient would like to re-engage for care coordination needs.  Patient was not contacted during this encounter.  LCSW collaborated with care team to accomplish patient's care plan goal   Kasarah Sitts, [place] [facility]  [phone]

## 2021-07-16 NOTE — Chronic Care Management (AMB) (Signed)
  Care Management  Collaboration  Note  07/16/2021 Name: Meredith Owens MRN: [id number] DOB: October 29, 1940  Meredith Owens is a 80 y.o. year old female who is a primary care patient of Simmons-Robinson, Makiera, MD. The CCM team was consulted reference care coordination needs for Mental Health Counseling and Resources.  Assessment: Patient was not interviewed or contacted during this encounter. She has connected with mental health treatment and no additional needs identified at this time.See Care Plan or interventions for patient self-care actives.   Intervention:Conducted brief assessment, recommendations and relevant information discussed.  CCM LCSW collaborated with PCP  to assist with meeting patient's needs.    Follow up Plan: All care plan goals have been met. Will disconnect from care team after this encounter CCM LCSW will disconnect from patient's care team at this time, but will be available at any time patient would like to re-engage for care coordination needs.    Review of patient past medical history, allergies, medications, and health status, including review of pertinent consultant reports was performed as part of comprehensive evaluation and provision of care management/care coordination services.   Care Plan Conditions to be addressed/monitored per PCP order: Depression,    Care Plan : Social Work  Updates made by Meredith Cane, LCSW since 07/16/2021 12:00 AM     Problem: Coping Skills / stress      Goal: Coping Skills Enhanced and symptoms of stress managed Completed 07/15/2021  Start Date: 08/13/2020  This Visit's Progress: On track  Recent Progress: On track  Priority: High  Note:   Current barriers:   Chronic medical concerns and Mental Health needs related memory loss and symptoms of depression. Currently experiencing symptoms of stress which seems to be exacerbated by her medical concerns and past trauma   Needs Support, Education, and Care Coordination in order to  meet unmet need of managing her stress.   Clinical Interventions:  Discussed and Identify effective and ineffective coping strategies.  Active listening; Motivational Interviewing, Solution-Focused Strategies, Emotional/Supportive Counseling, and Problem Solving  Verbalization of feelings encouraged; encouraged ongoing counseling due to hx of trauma and benefits of counseling. Collaborated with PCP for ongoing needs Inter-disciplinary care team collaboration (see longitudinal plan of care) Patient Goals/Self-Care Activities: Keep appointment with [place], [place] / [place]   [phone]

## 2021-07-17 ENCOUNTER — Other Ambulatory Visit: Payer: Self-pay

## 2021-07-17 ENCOUNTER — Ambulatory Visit (INDEPENDENT_AMBULATORY_CARE_PROVIDER_SITE_OTHER): Payer: PPO | Admitting: Pharmacist

## 2021-07-17 ENCOUNTER — Encounter: Payer: Self-pay | Admitting: Pharmacist

## 2021-07-17 DIAGNOSIS — I10 Essential (primary) hypertension: Secondary | ICD-10-CM | POA: Diagnosis not present

## 2021-07-17 NOTE — Patient Instructions (Signed)
Blood Pressure Activity Diary Time Lying down/ Sleeping Walking/ Exercise Stressed/ Angry Headache/ Pain Dizzy  9 AM       10 AM       11 AM       12 PM       1 PM       2 PM       Time Lying down/ Sleeping Walking/ Exercise Stressed/ Angry Headache/ Pain Dizzy  3 PM       4 PM        5 PM       6 PM       7 PM       8 PM       Time Lying down/ Sleeping Walking/ Exercise Stressed/ Angry Headache/ Pain Dizzy  9 PM       10 PM       11 PM       12 AM       1 AM       2 AM       3 AM       Time Lying down/ Sleeping Walking/ Exercise Stressed/ Angry Headache/ Pain Dizzy  4 AM       5 AM       6 AM       7 AM       8 AM       9 AM       10 AM        Time you woke up: _________                  Time you went to sleep:__________  Return monitor Monday morning.  I will call you with results. Call the [facility] if you have any questions before then ([phone])  Wearing the Blood Pressure Monitor The cuff will inflate every 20 minutes during the day and every 30 minutes while you sleep. Your blood pressure readings will NOT display after cuff inflation Fill out the blood pressure-activity diary during the day, especially during activities that may affect your reading -- such as exercise, stress, walking, taking your blood pressure medications  Important things to know: Avoid taking the monitor off for the next 24 hours, unless it causes you discomfort or pain. Do NOT get the monitor wet and do NOT dry to clean the monitor with any cleaning products. Do NOT put the monitor on anyone else's arm. When the cuff inflates, avoid excess movement. Let the cuffed arm hang loosely, slightly away from the body. Avoid flexing the muscles or moving the hand/fingers. When you go to sleep, make sure that the hose is not kinked. Remember to fill out the blood pressure activity diary. If you experience severe pain or unusual pain (not associated with getting your blood pressure  checked), remove the monitor.  Troubleshooting:  Code  Troubleshooting   1  Check cuff position, tighten cuff   2, 3  Remain still during reading   4, 87  Check air hose connections and make sure cuff is tight   85, 89  Check hose connections and make tubing is not crimped   86  Push START/STOP to restart reading   88, 91  Retry by pushing START/STOP   90  Replace batteries. If problem persists, remove monitor and bring back to   clinic at follow up   97, 98, 99  Service required - Remove monitor and bring back to clinic  at follow up    

## 2021-07-17 NOTE — Progress Notes (Signed)
REveiwed: I agree with Dr. Koval's documentation and management. 

## 2021-07-17 NOTE — Progress Notes (Signed)
   S:    Patient arrives in good spirits, ambulating without assistance, and accompanied by her son, Meredith Owens.    Presents to the clinic for ambulatory blood pressure evaluation.   Patient was referred and last seen by Primary Care Provider, Dr. Alba Cory on 07/15/2021.   Diagnosed with Hypertension in the year of 2008.    Medication compliance is reported to be good.  Discussed procedure for wearing the monitor and gave patient written instructions. Monitor was placed on non-dominant arm (left) with instructions for son to return monitor on 12/12/ at noon.  Current BP Medications include:  Carvedilol 12.5mg  BID, Valsartan 80mg  daily, Hydralazine 10mg  TID.    O:  Physical Exam Constitutional:      Appearance: Normal appearance.  Pulmonary:     Effort: Pulmonary effort is normal.  Skin:    Findings: Lesion: anxious concern about BP monitor expressed.  Neurological:     Mental Status: She is alert.  Psychiatric:        Mood and Affect: Mood normal.    Review of Systems  Psychiatric/Behavioral:  Positive for memory loss. The patient is nervous/anxious.   All other systems reviewed and are negative.  Last 3 Office BP readings: BP Readings from Last 3 Encounters:  07/15/21 (!) 191/68  07/01/21 (!) 178/72  04/30/21 (!) 122/48    Basic Metabolic Panel    Component Value Date/Time   NA 143 12/08/2020 1714   K 4.3 12/08/2020 1714   CL 106 12/08/2020 1714   CO2 25 12/08/2020 1714   GLUCOSE 80 12/08/2020 1714   GLUCOSE 91 01/20/2016 1505   BUN 19 12/08/2020 1714   CREATININE 1.08 (H) 12/08/2020 1714   CREATININE 1.09 (H) 01/20/2016 1505   CALCIUM 10.3 12/22/2020 1550   GFRNONAA 41 (L) 06/27/2020 1547   GFRNONAA 50 (L) 01/20/2016 1505   GFRAA 47 (L) 06/27/2020 1547   GFRAA 58 (L) 01/20/2016 1505    ABPM Study Data: Arm Placement left arm   A/P: History of hypertension since 2008. Patient is concerned that she has "white coat hypertension".   Instructed to  continue all medications as previous.   Patient's son Meredith Owens) will bring monitor for evaluation (return) on 07/20/2021 mid-day.  Plan is to call him [phone]   Procedure reviewed, patient and son educated on proper use and written information provided.  Total time in face-to-face counseling 13 minutes.   F/U Clinic Visit Monday 07/20/2021

## 2021-07-17 NOTE — Assessment & Plan Note (Signed)
History of hypertension since 2008. Patient is concerned that she has "white coat hypertension".   Instructed to continue all medications as previous.   Patient's son Meredith Owens) will bring monitor for evaluation (return) on 07/20/2021 mid-day.  Plan is to call him [phone]

## 2021-07-18 NOTE — Assessment & Plan Note (Signed)
Will follow up with patient after ambulatory BP monitoring  For now will continue current antihypertensive regimen including hydralazine 10mg  TID, coreg 12.5mg  BID and valsartan 80mg  daily

## 2021-07-18 NOTE — Progress Notes (Signed)
    SUBJECTIVE:   CHIEF COMPLAINT / HPI: HTN f/u   Patient presents with her son, Glendell Docker, for follow up regarding HTN. She has been diagnosed with white coat hypertension in the past. Home measurements ranged in 130s-140s and often elevated in office ranging from 371-062I systolic. Patient continues to deny symptoms of HA or chest pain currently. She does reports intermittent "zinging" pains in her temporal regions that last less than one minute and occur sporadically,she estimates maybe a few times per month. She reports adherence to her antihypertensive medication regimen. She is currently working with the pharmacy team to set up ambulatory BP monitoring.   PERTINENT  PMH / PSH:  HTN, suspect white coat HTN   OBJECTIVE:   BP (!) 191/68   Pulse 70   Ht 5\' 1"  (1.549 m)   Wt 119 lb 9.6 oz (54.3 kg)   SpO2 100%   BMI 22.60 kg/m   General: female appearing stated age in no acute distress Cardio: Normal S1 and S2, no S3 or S4. Rhythm is regular. No murmurs or rubs.  Bilateral radial pulses palpable Pulm: Clear to auscultation bilaterally, no crackles, wheezing, or diminished breath sounds. Normal respiratory effort, stable on RA Abdomen: Bowel sounds normal. Abdomen soft and non-tender.  Extremities: No peripheral edema. Warm/ well perfused.  Neuro: pt alert and oriented x4   ASSESSMENT/PLAN:   Elevated blood pressure reading in office with white coat syndrome, with diagnosis of hypertension Will follow up with patient after ambulatory BP monitoring  For now will continue current antihypertensive regimen including hydralazine 10mg  TID, coreg 12.5mg  BID and valsartan 80mg  daily     Eulis Foster, MD [place]

## 2021-07-20 ENCOUNTER — Encounter: Payer: Self-pay | Admitting: Family Medicine

## 2021-07-20 ENCOUNTER — Ambulatory Visit: Payer: PPO | Admitting: Family Medicine

## 2021-07-20 ENCOUNTER — Ambulatory Visit: Payer: PPO | Admitting: Pharmacist

## 2021-07-20 ENCOUNTER — Telehealth: Payer: Self-pay | Admitting: Pharmacist

## 2021-07-20 ENCOUNTER — Ambulatory Visit: Payer: Self-pay

## 2021-07-20 VITALS — BP 190/68 | Ht 61.0 in | Wt 119.0 lb

## 2021-07-20 DIAGNOSIS — M25561 Pain in right knee: Secondary | ICD-10-CM

## 2021-07-20 DIAGNOSIS — I10 Essential (primary) hypertension: Secondary | ICD-10-CM

## 2021-07-20 MED ORDER — VALSARTAN-HYDROCHLOROTHIAZIDE 80-12.5 MG PO TABS: 1.0000 | ORAL_TABLET | Freq: Every day | ORAL | 1 refills | Status: DC

## 2021-07-20 NOTE — Telephone Encounter (Signed)
   Patient's son contacted to share results of the ambulatory BP monitor for his mother.  The monitor was worn 12/9- 12/10  - No issues with the device was reports.    Medication compliance is reported to be good with carvedilol, valsartan 80mg  and hydralazine.  O:  Basic Metabolic Panel    Component Value Date/Time   NA 143 12/08/2020 1714   K 4.3 12/08/2020 1714   CL 106 12/08/2020 1714   CO2 25 12/08/2020 1714   GLUCOSE 80 12/08/2020 1714   GLUCOSE 91 01/20/2016 1505   BUN 19 12/08/2020 1714   CREATININE 1.08 (H) 12/08/2020 1714   CREATININE 1.09 (H) 01/20/2016 1505   CALCIUM 10.3 12/22/2020 1550   GFRNONAA 41 (L) 06/27/2020 1547   GFRNONAA 50 (L) 01/20/2016 1505   GFRAA 47 (L) 06/27/2020 1547   GFRAA 58 (L) 01/20/2016 1505    ABPM Study Data: Arm Placement left arm  Overall Mean 24hr BP:   153/67 mmHg HR: 61  Daytime Mean BP:  161/71 mmHg HR: 60  Nighttime Mean BP:  134/58 mmHg HR: 62  Dipping Pattern: Yes.    Sys:   16   Dia: 18   [normal dipping ~10-20%]    A/P: History of hypertension on three drug regimen found to have isolated systolic hypertension given 24-hour ambulatory blood pressure demonstrates awake readings or 161/71 Changes to medications: - Stop valsartan - Initiate valsartan 80/12.5mg  daily - Continue carvedilol and hydralazine at this time.     Results reviewed and son verbalized understanding of results and treatment plan.  Total time 14 minutes.   F/U Clinic Visit with Dr. Hughes Better in 9 days for BP check and BMET reevaluation.

## 2021-07-20 NOTE — Patient Instructions (Addendum)
The small knot in this area is consistent with a small hematoma or atypical cyst. It is not dangerous and I'd recommend leaving it alone. If it bothers you, use topicals as you have been (biofreeze is an option as well). Icing 15 minutes at a time as needed. If you notice this is getting larger (this would be unusual), call me to recheck this. Overall I'd consider reevaluating it in 3 months if you still notice it.
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
## 2021-07-20 NOTE — Assessment & Plan Note (Signed)
History of hypertension on three drug regimen found to have isolated systolic hypertension given 24-hour ambulatory blood pressure demonstrates awake readings or 161/71 Changes to medications: - Stop valsartan - Initiate valsartan 80/12.5mg  daily - Continue carvedilol and hydralazine at this time.

## 2021-07-21 NOTE — Telephone Encounter (Signed)
Noted and agree. 

## 2021-07-23 ENCOUNTER — Ambulatory Visit (HOSPITAL_COMMUNITY): Payer: PPO | Admitting: Psychiatry

## 2021-07-24 ENCOUNTER — Ambulatory Visit (INDEPENDENT_AMBULATORY_CARE_PROVIDER_SITE_OTHER): Payer: PPO | Admitting: Psychiatry

## 2021-07-24 ENCOUNTER — Other Ambulatory Visit: Payer: Self-pay

## 2021-07-24 DIAGNOSIS — F321 Major depressive disorder, single episode, moderate: Secondary | ICD-10-CM

## 2021-07-24 NOTE — Progress Notes (Signed)
Virtual Visit via Telephone Note  I connected with Meredith Owens on 07/24/21 at 10:00 AM EST by telephone and verified that I am speaking with the correct person using two identifiers.  Location: Patient: Home Provider: [place] office    I discussed the limitations, risks, security and privacy concerns of performing an evaluation and management service by telephone and the availability of in person appointments. I also discussed with the patient that there may be a patient responsible charge related to this service. The patient expressed understanding and agreed to proceed.    I provided 50 minutes of non-face-to-face time during this encounter.   Alonza Smoker, LCSW    THERAPIST PROGRESS NOTE  Session Time: Friday  07/24/2021 10:00 AM - 10:50 AM   Participation Level: Active  Behavioral Response: AlertAnxious  Type of Therapy: Individual Therapy  Treatment Goals addressed: Establish therapeutic alliance, learn and implement cognitive and behavioral strategies to cope with anxiety and stress  Interventions: CBT and Supportive  Summary: Meredith Owens is a 80 y.o. female who is referred for services by LCSW Casimer Lanius due to patient experiencing symptoms of depression as well as anxiety.  She denies any psychiatric hospitalizations or involvement in outpatient therapy.  Patient states being concerned about her physical health particularly weight loss, her mental health as she forgets things, and dental health.  Patient states living by herself and feeling alone.  She also reports excessive worry, feelings of hopelessness, worthlessness, fatigue, and tearfulness.  Patient last was seen via virtual visit about 2 weeks ago.  Patient reports continued improved mood since last session.  Her score on the PHQ-9 has decreased by 50% per patient's report, she been using replacement statements when feeling lonely, she has become much more aware of her thoughts and has been  able to intervene successfully to avoid spiraling.  She is managing contact with friends via phone.  She also is looking forward to spending the Christmas holidays with her family.    Suicidal/Homicidal: Nowithout intent/plan  Therapist Response: Reviewed symptoms, praised and reinforced patient's use of replacement statements, assisted patient identify triggers of depressed mood, encouraged patient to continue using replacement statements, also discussed activities patient could pursue to help cope with feelings of loneliness including listening to music, reading, and reminiscing over happy memories especially those with her husband, encouraged patient to apply strategies discussed in session  Plan: Return again in 2 weeks.  Diagnosis: Axis I: Major depressive disorder, single episode, moderate       Alonza Smoker, LCSW 07/24/2021

## 2021-07-29 ENCOUNTER — Ambulatory Visit (INDEPENDENT_AMBULATORY_CARE_PROVIDER_SITE_OTHER): Payer: PPO | Admitting: Pharmacist

## 2021-07-29 ENCOUNTER — Other Ambulatory Visit: Payer: Self-pay

## 2021-07-29 ENCOUNTER — Ambulatory Visit (INDEPENDENT_AMBULATORY_CARE_PROVIDER_SITE_OTHER): Payer: PPO

## 2021-07-29 DIAGNOSIS — Z23 Encounter for immunization: Secondary | ICD-10-CM

## 2021-07-29 DIAGNOSIS — I10 Essential (primary) hypertension: Secondary | ICD-10-CM

## 2021-07-29 NOTE — Progress Notes (Signed)
° °  Subjective:    Patient ID: Meredith Owens, female    DOB: 01/31/1941, 80 y.o.   MRN: [id number]  HPI Patient is a 80 y.o. female who presents for hypertension management. She is in good spirits and presents without assistance with her son present. Patient was referred and last seen by Primary Care Provider on 07/15/21.  Hypertension ROS: taking medications as instructed, no medication side effects noted, home BP monitoring in range of 053'Z systolic over 76'B diastolic, no chest pain on exertion, no dyspnea on exertion, no swelling of ankles, and no orthostatic dizziness or lightheadedness.  New concerns: none.   Current antihypertensives: Carvedilol 12.5mg  twice daily, hydralazine 10mg  three times daily, valsartan-HCTZ 80-12.5mg  daily  Patient reports she did not start taking her new blood pressure medication (Valsartan-HCTZ) until two days ago. Follow-up was initially scheduled with repeat labs assuming patient would have taken the medication for 9 days.  Objective:   Last 3 Office BP readings: BP Readings from Last 3 Encounters:  07/29/21 (!) 148/52  07/20/21 (!) 190/68  07/15/21 (!) 191/68    BMET    Component Value Date/Time   NA 143 12/08/2020 1714   K 4.3 12/08/2020 1714   CL 106 12/08/2020 1714   CO2 25 12/08/2020 1714   GLUCOSE 80 12/08/2020 1714   GLUCOSE 91 01/20/2016 1505   BUN 19 12/08/2020 1714   CREATININE 1.08 (H) 12/08/2020 1714   CREATININE 1.09 (H) 01/20/2016 1505   CALCIUM 10.3 12/22/2020 1550   GFRNONAA 41 (L) 06/27/2020 1547   GFRNONAA 50 (L) 01/20/2016 1505   GFRAA 47 (L) 06/27/2020 1547   GFRAA 58 (L) 01/20/2016 1505    Renal function: CrCl cannot be calculated (Patient's most recent lab result is older than the maximum 21 days allowed.).  Clinical ASCVD: No  The ASCVD Risk score (Arnett DK, et al., 2019) failed to calculate for the following reasons:   Cannot find a previous HDL lab   Cannot find a previous total cholesterol lab  BP (!) 148/52     Pulse 62   Appearance alert, well appearing, and in no distress and oriented to person, place, and time. General exam BP noted to be mildly elevated today in office.   Assessment/Plan:  Hypertension improved, asymptomatic, and needs further observation.  Repeat labs ordered prior to next appointment. Follow up: 1 week and as needed. -Continued current medications.  -Counseled on lifestyle modifications for blood pressure control including reduced dietary sodium, increased exercise, adequate sleep.  Results reviewed and written information provided.   Total time in face-to-face counseling 20 minutes.

## 2021-07-29 NOTE — Assessment & Plan Note (Signed)
Hypertension improved, asymptomatic, and needs further observation.  Repeat labs ordered prior to next appointment. Follow up: 1 week and as needed. -Continued current medications.  -Counseled on lifestyle modifications for blood pressure control including reduced dietary sodium, increased exercise, adequate sleep.

## 2021-07-29 NOTE — Patient Instructions (Signed)
Miss Pescador it was a pleasure seeing you today.   Your blood pressure today is improving .   Continue taking blood pressure medications as prescribed.   Limiting salt intake and caffeine.  Exercising as able for at least 30 minutes for 5 days out of the week, can also help you lower your blood pressure.  Take your blood pressure at home if you are able. Please write down these numbers and bring them to your visits.  If you have any questions please call me   Please come back next week to have your labs and blood pressure checked

## 2021-07-30 NOTE — Addendum Note (Signed)
Addended by: Concepcion Living on: 07/30/2021 08:00 AM   Modules accepted: Orders

## 2021-07-31 DIAGNOSIS — Z970 Presence of artificial eye: Secondary | ICD-10-CM | POA: Diagnosis not present

## 2021-07-31 DIAGNOSIS — H401222 Low-tension glaucoma, left eye, moderate stage: Secondary | ICD-10-CM | POA: Diagnosis not present

## 2021-07-31 DIAGNOSIS — H43812 Vitreous degeneration, left eye: Secondary | ICD-10-CM | POA: Diagnosis not present

## 2021-07-31 DIAGNOSIS — Z961 Presence of intraocular lens: Secondary | ICD-10-CM | POA: Diagnosis not present

## 2021-07-31 DIAGNOSIS — H16222 Keratoconjunctivitis sicca, not specified as Sjogren's, left eye: Secondary | ICD-10-CM | POA: Diagnosis not present

## 2021-07-31 DIAGNOSIS — H02422 Myogenic ptosis of left eyelid: Secondary | ICD-10-CM | POA: Diagnosis not present

## 2021-08-05 ENCOUNTER — Ambulatory Visit (INDEPENDENT_AMBULATORY_CARE_PROVIDER_SITE_OTHER): Payer: PPO

## 2021-08-05 ENCOUNTER — Other Ambulatory Visit: Payer: Self-pay

## 2021-08-05 DIAGNOSIS — I10 Essential (primary) hypertension: Secondary | ICD-10-CM

## 2021-08-05 NOTE — Progress Notes (Signed)
Patient here today for BP check.      Last BP was on 07/29/2021 and was 148/52.  BP today is 150/62 with a pulse of 57.    Checked BP in left arm with regular adult cuff.    Symptoms present: none.   Patient taking all BP medications as prescribed.  Precepted with Dr. Owens Shark. Recommended patient scheduling follow up for BP in 2-3 weeks. Patient scheduled with Dr. Arby Barrette on 08/24/21. Patient assisted to lab for BMP.   Talbot Grumbling, RN

## 2021-08-06 LAB — BASIC METABOLIC PANEL
BUN/Creatinine Ratio: 24 (ref 12–28)
BUN: 27 mg/dL (ref 8–27)
CO2: 28 mmol/L (ref 20–29)
Calcium: 10.4 mg/dL — ABNORMAL HIGH (ref 8.7–10.3)
Chloride: 105 mmol/L (ref 96–106)
Creatinine, Ser: 1.13 mg/dL — ABNORMAL HIGH (ref 0.57–1.00)
Glucose: 81 mg/dL (ref 70–99)
Potassium: 4.5 mmol/L (ref 3.5–5.2)
Sodium: 144 mmol/L (ref 134–144)
eGFR: 49 mL/min/{1.73_m2} — ABNORMAL LOW (ref >59)

## 2021-08-07 ENCOUNTER — Other Ambulatory Visit: Payer: Self-pay

## 2021-08-07 ENCOUNTER — Ambulatory Visit (INDEPENDENT_AMBULATORY_CARE_PROVIDER_SITE_OTHER): Payer: PPO | Admitting: Psychiatry

## 2021-08-07 DIAGNOSIS — F321 Major depressive disorder, single episode, moderate: Secondary | ICD-10-CM | POA: Diagnosis not present
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
## 2021-08-21 ENCOUNTER — Other Ambulatory Visit: Payer: Self-pay

## 2021-08-21 ENCOUNTER — Ambulatory Visit (INDEPENDENT_AMBULATORY_CARE_PROVIDER_SITE_OTHER): Payer: PPO | Admitting: Psychiatry

## 2021-08-21 DIAGNOSIS — F321 Major depressive disorder, single episode, moderate: Secondary | ICD-10-CM | POA: Diagnosis not present

## 2021-08-21 NOTE — Progress Notes (Signed)
Virtual Visit via Telephone Note  I connected with Meredith Owens on 08/21/21 at 9:17 AM EST by telephone and verified that I am speaking with the correct person using two identifiers.  Location: Patient: Home Provider: [place] office    I discussed the limitations, risks, security and privacy concerns of performing an evaluation and management service by telephone and the availability of in person appointments. I also discussed with the patient that there may be a patient responsible charge related to this service. The patient expressed understanding and agreed to proceed.    I provided 43 minutes of non-face-to-face time during this encounter.   Alonza Smoker, LCSW    THERAPIST PROGRESS NOTE  Session Time: Friday  08/21/2021 9:17 AM -  10:00 AM   Participation Level: Active  Behavioral Response: AlertAnxious  Type of Therapy: Individual Therapy  Treatment Goals addressed:    Identify cognitive patterns and beliefs that support depression  Interventions: CBT and Supportive  Summary: Meredith Owens is a 81 y.o. female who is referred for services by LCSW Casimer Lanius due to patient experiencing symptoms of depression as well as anxiety.  She denies any psychiatric hospitalizations or involvement in outpatient therapy.  Patient states being concerned about her physical health particularly weight loss, her mental health as she forgets things, and dental health.  Patient states living by herself and feeling alone.  She also reports excessive worry, feelings of hopelessness, worthlessness, fatigue, and tearfulness.  Patient last was seen via virtual visit about 2 weeks ago.  Patient reports continuing to do well since last session.  She reports practicing deep breathing and having difficulty initially.  She sought assistance from her children and reports this was helpful.  She has been practicing more regularly and says it has been beneficial.  Patient reports still  spending a lot of time alone but not feeling lonely or depressed.  She has been using deep breathing to help calm self and become more aware of her thoughts.  Once recognizing negative thought patterns, she has been using replacement statements.  Patient reports been engaging in other activities such as household tasks, watching TV, and reading . 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  Diagnosis: Axis I: Major depressive disorder, single episode, moderate       Alonza Smoker, LCSW 08/21/2021

## 2021-08-24 ENCOUNTER — Ambulatory Visit (INDEPENDENT_AMBULATORY_CARE_PROVIDER_SITE_OTHER): Payer: PPO | Admitting: Family Medicine

## 2021-08-24 ENCOUNTER — Other Ambulatory Visit: Payer: Self-pay

## 2021-08-24 ENCOUNTER — Encounter: Payer: Self-pay | Admitting: Family Medicine

## 2021-08-24 VITALS — BP 169/62 | HR 60 | Ht 61.0 in | Wt 113.6 lb

## 2021-08-24 DIAGNOSIS — R519 Headache, unspecified: Secondary | ICD-10-CM

## 2021-08-24 DIAGNOSIS — I10 Essential (primary) hypertension: Secondary | ICD-10-CM | POA: Diagnosis not present

## 2021-08-24 NOTE — Patient Instructions (Signed)
It was great seeing you today!  Today you came in for blood pressure follow up and it was mildly elevated at 155/61.  We will continue to monitor for now, because we do not want your diastolic blood pressure which is that bottom number to go any lower, and we do not want your heart rate to go too much lower.  If your blood pressure when you check it at home continues to be elevated we can make adjustments and possibly add 1 more agent on if needed.  I also recommend trying to eat more fruits and vegetables, and less fast food or processed food, which will reduce your salt intake and also help with your blood pressure.   Please check-out at the front desk before leaving the clinic. Schedule to be seen in about 1 month for follow up, but if you need to be seen earlier than that for any new issues we're happy to fit you in, just give [place] a call!  Visit Reminders: - Check your blood pressure at home in the morning and at night for a couple of days and bring those measurements in - Continue to work on your healthy eating habits and incorporating exercise into your daily life.   Feel free to call with any questions or concerns at any time, at [phone].   Take care,  Dr. Shary Key [facility]

## 2021-08-24 NOTE — Progress Notes (Signed)
° ° °  SUBJECTIVE:   CHIEF COMPLAINT / HPI:   Ms. Stroh is a 81 yo who presents for HTN follow up with her son. She has been seen by pharmacy almost 1 month ago   BP on 12/22 - 148/52 12/28- 150/62 Today 155/61. Currently taking valsartan/hydrochlorothiazide 80/12.5 daily, Carvedilol 12.5mg  twice daily, hydralazine 10mg  three times daily  States she knows she could be more physically active  States she does not eat well and knows she needs to do better- eats quick and easy food   States she has been getting shooting pains in her right temple. Comes and goes over the past couple of months. Is improved with Tylenol.  Denies the pain being worse with light and sound.    OBJECTIVE:   BP (!) 155/61    Pulse 60    Ht 5\' 1"  (1.549 m)    Wt 113 lb 9.6 oz (51.5 kg)    SpO2 100%    BMI 21.46 kg/m    Physical exam  General: well appearing, NAD Cardiovascular: RRR, no murmurs Lungs: CTAB. Normal WOB Abdomen: soft, non-distended, non-tender Skin: warm, dry. No edema  ASSESSMENT/PLAN:   No problem-specific Assessment & Plan notes found for this encounter.   HTN BP today 155/61. Asymptomatic. Current regimen: valsartan/hydrochlorothiazide 80/12.5 daily, Carvedilol 12.5mg  twice daily, hydralazine 10mg  three times daily. She reports this BP being much improved from what it used to be. Discussed lifestyle modifications including less processed food, more vegetables, and also getting in more physical activity. Did not adjust medication at this time given low diastolic pressure and low normal HR. I also recommended taking some home measurements, and son will help her with her blood pressure cuff. Will continue to monitor and since she also follows with pharmacy would recommend following up with them again if adjustments need to be made.   Headache Occasional pain in temple over the past couple of months but occurring less frequently. Resolves with Tylenol. She thinks it may be associated with her  high blood pressure, and occurring less frequently because it is getting more controlled. Will continue to monitor for now. Discussed continuing Tylenol as needed. Gave strict return precautions if headaches were to worsen  Shary Key, [place]

## 2021-08-26 ENCOUNTER — Telehealth: Payer: Self-pay

## 2021-08-26 NOTE — Telephone Encounter (Signed)
Patient calls nurse line. Patient states that she is returning a phone call from Dr. Arby Barrette.   Will forward to Dr. Arby Barrette. Please return call to patient at [phone].  Talbot Grumbling, RN

## 2021-08-27 ENCOUNTER — Encounter: Payer: Self-pay | Admitting: Gastroenterology
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
## 2021-09-03 ENCOUNTER — Encounter: Payer: Self-pay | Admitting: Family Medicine

## 2021-09-04 ENCOUNTER — Other Ambulatory Visit: Payer: Self-pay

## 2021-09-04 ENCOUNTER — Ambulatory Visit (INDEPENDENT_AMBULATORY_CARE_PROVIDER_SITE_OTHER): Payer: PPO | Admitting: Psychiatry

## 2021-09-04 DIAGNOSIS — F321 Major depressive disorder, single episode, moderate: Secondary | ICD-10-CM

## 2021-09-04 NOTE — Progress Notes (Signed)
Virtual Visit via Telephone Note  I connected with Meredith Owens on 09/04/21 at 10:10 AM EST by telephone and verified that I am speaking with the correct person using two identifiers.  Location: Patient: Home Provider: [place] office    I discussed the limitations, risks, security and privacy concerns of performing an evaluation and management service by telephone and the availability of in person appointments. I also discussed with the patient that there may be a patient responsible charge related to this service. The patient expressed understanding and agreed to proceed.   I provided 48  minutes of non-face-to-face time during this encounter.   Alonza Smoker, LCSW    THERAPIST PROGRESS NOTE  Session Time: Friday  09/04/2021 10:10 AM - 10:58 AM   Participation Level: Active  Behavioral Response: AlertAnxious  Type of Therapy: Individual Therapy  Treatment Goals addressed:    Identify cognitive patterns and beliefs that support depression  Interventions: CBT and Supportive  Summary: Meredith Owens is a 81 y.o. female who is referred for services by LCSW Casimer Lanius due to patient experiencing symptoms of depression as well as anxiety.  She denies any psychiatric hospitalizations or involvement in outpatient therapy.  Patient states being concerned about her physical health particularly weight loss, her mental health as she forgets things, and dental health.  Patient states living by herself and feeling alone.  She also reports excessive worry, feelings of hopelessness, worthlessness, fatigue, and tearfulness.  Patient last was seen via virtual visit about 2 weeks ago.  Patient reports continuing to do well since last session.  She reports experiencing some stress regarding her eye as she has the beginning stages of glaucoma.  However she is working with her doctor who is referring her to a specialist.  Patient reports becoming anxious at times regarding the  process but reports successfully using deep breathing to calm self.  She is pleased as her blood pressure is not as elevated as it was.  Patient continues to look forward to eventually moving in with her daughter.  However, she reports sometimes becoming anxious trying to determine steps/strategies to downsize.  Overall, patient reports her mood has been good.  She still is alone at times but reports decreased loneliness.  She continues to use replacement statements, and distracting activities as well as initiate contact with friends via phone.  She also has regular contact/interaction from her children and other family members.   Suicidal/Homicidal: Nowithout intent/plan  Therapist Response: Reviewed symptoms, praised and reinforced patient's efforts to practice deep breathing, discussed effects, developed plan with patient to continue practicing deep breathing daily, continued to examine thought patterns, assisted patient identify more realistic expectations of self regarding planning strategies and steps to downsize, assisted patient identify ways to use assertiveness skills to request help from family regarding getting ready for the move    Plan: Return again in 2 weeks.  Diagnosis: Axis I: Major depressive disorder, single episode, moderate       Alonza Smoker, LCSW 09/04/2021

## 2021-09-06 ENCOUNTER — Other Ambulatory Visit: Payer: Self-pay | Admitting: Family Medicine

## 2021-09-11 ENCOUNTER — Other Ambulatory Visit: Payer: Self-pay | Admitting: Family Medicine

## 2021-09-11 DIAGNOSIS — Z1231 Encounter for screening mammogram for malignant neoplasm of breast: Secondary | ICD-10-CM

## 2021-09-18 ENCOUNTER — Ambulatory Visit (HOSPITAL_COMMUNITY): Payer: PPO | Admitting: Psychiatry

## 2021-09-29 ENCOUNTER — Encounter: Payer: Self-pay | Admitting: Family Medicine

## 2021-09-29 ENCOUNTER — Ambulatory Visit (INDEPENDENT_AMBULATORY_CARE_PROVIDER_SITE_OTHER): Payer: PPO | Admitting: Family Medicine

## 2021-09-29 ENCOUNTER — Other Ambulatory Visit: Payer: Self-pay

## 2021-09-29 VITALS — BP 165/54 | HR 56 | Ht 61.0 in | Wt 115.4 lb

## 2021-09-29 DIAGNOSIS — I10 Essential (primary) hypertension: Secondary | ICD-10-CM

## 2021-09-29 NOTE — Patient Instructions (Signed)
It was great seeing you today!  Today we followed up on your blood pressure and we arent going to make adjustments to your medication at this time. I do recommend following up with your heart doctor: Dr. Marlou Porch [address], [place], [place] [address]. [phone]. As we discussed today, I recommend checking your blood pressure at home how we did it in the clinic to see what it is like there, and bring that in with you to your cardiologist appointment.   Feel free to call with any questions or concerns at any time, at [phone].   Take care,  Dr. Shary Key [facility]

## 2021-09-29 NOTE — Progress Notes (Signed)
° ° °  SUBJECTIVE:   CHIEF COMPLAINT / HPI:   Meredith Owens is a 81 yo who presents with her son for follow up   BP 165/54 today. Currently on Valsartan/hydrochlorothiazide 80/12.5 daily, Carvedilol 12.5mg  twice daily, hydralazine 10mg  three times daily. Was seen in the clinic on 1/16 and opted not to change medication since diastolic BP and HR have been soft. She did not bring in BP measurements from home but reports they have been lower at home. She also endorses her headaches have improved.    OBJECTIVE:   BP (!) 165/54    Pulse (!) 56    Wt 115 lb 6.4 oz (52.3 kg)    SpO2 100%    BMI 21.80 kg/m    General: alert, pleasant, NAD CV: RRR no murmurs Resp: CFTAB normal WOB GI: soft, non distended  Derm: warm, dry. No LE edema   ASSESSMENT/PLAN:   No problem-specific Assessment & Plan notes found for this encounter.   HTN BP 165/54 today. Currently on Valsartan/hydrochlorothiazide 80/12.5 daily, Carvedilol 12.5mg  twice daily, hydralazine 10mg  three times daily. Will increase to Valsartan 160/12.5. Also recommended following up with cardiology as she has not seen them in over a year, as well as checking her BP at home for a few days. She brought in her home BP cuff which we compared to the clinic measurement and was similar.   [place]

## 2021-10-02 ENCOUNTER — Other Ambulatory Visit: Payer: Self-pay

## 2021-10-02 ENCOUNTER — Ambulatory Visit (INDEPENDENT_AMBULATORY_CARE_PROVIDER_SITE_OTHER): Payer: PPO | Admitting: Psychiatry

## 2021-10-02 DIAGNOSIS — F321 Major depressive disorder, single episode, moderate: Secondary | ICD-10-CM | POA: Diagnosis not present

## 2021-10-02 NOTE — Progress Notes (Signed)
Virtual Visit via Telephone Note  I connected with Meredith Owens on 10/02/21 at 9:52 AM EST by telephone and verified that I am speaking with the correct person using two identifiers.  Location: Patient: Home Provider: [place] office    I discussed the limitations, risks, security and privacy concerns of performing an evaluation and management service by telephone and the availability of in person appointments. I also discussed with the patient that there may be a patient responsible charge related to this service. The patient expressed understanding and agreed to proceed.     I provided 52 minutes of non-face-to-face time during this encounter.    Alonza Smoker, LCSW    THERAPIST PROGRESS NOTE  Session Time: Friday  2/24//2023 9:53 AM - 9:45 AM   Participation Level: Active  Behavioral Response: AlertAnxious  Type of Therapy: Individual Therapy  Treatment Goals addressed:    Identify cognitive patterns and beliefs that support depression  Progress on Goals:  progressing  Interventions: CBT and Supportive  Summary: Meredith Owens is a 81 y.o. female who is referred for services by LCSW Casimer Lanius due to patient experiencing symptoms of depression as well as anxiety.  She denies any psychiatric hospitalizations or involvement in outpatient therapy.  Patient states being concerned about her physical health particularly weight loss, her mental health as she forgets things, and dental health.  Patient states living by herself and feeling alone.  She also reports excessive worry, feelings of hopelessness, worthlessness, fatigue, and tearfulness.  Patient last was seen via virtual visit about 4 weeks ago.  She continues to experience symptoms of depression as reflected in PHQ 2 and 9 but decreased intensity, frequency, and duration.  Patient reports doing well although having ups and downs.  She reports feeling a little down as she is concerned she may not be able  to attend her son's 60th birthday party in April.  She has osteoarthritis and worries thhe venue may not be handicap assessable.  She has talked with her family and they are going to check into this.  She reports some rumination about this but trying to distract herself with various activities, talking to friends, and use coping statements.  She also has continued to practice deep breathing to manage stress and reports this has been very helpful.  She also reports using assertiveness skills to talk with family about help and getting ready for her move.  They decided to delay patient's move date until September.  Patient is very pleased about this as this will give her more time in preparation for the move.  She continues to experience health issues related to her eye and her blood pressure and reports some stress regarding this as well.  However, she reports strong support from family regarding attending appointments and making decisions about next steps for treatment.   Suicidal/Homicidal: Nowithout intent/plan  Therapist Response: Reviewed symptoms, administered PHQ 2 and 9, praised and reinforced patient's use of assertiveness skills to express her concerns to family, praised and reinforced patient's efforts to use helpful coping strategies to cope with stress, assisted patient identify her thought patterns regarding attending signs birthday party/health issues, assisted patient identify helpful replacement statements for negative thoughts,   Plan: Return again in 2 weeks.  Diagnosis: Axis I: Major depressive disorder, single episode, moderate   Collaboration of Care: Other none needed at this session  Patient/Guardian was advised Release of Information must be obtained prior to any record release in order to collaborate their care  with an outside provider. Patient/Guardian was advised if they have not already done so to contact the registration department to sign all necessary forms in order for [place] to  release information regarding their care.   Consent: Patient/Guardian gives verbal consent for treatment and assignment of benefits for services provided during this visit. Patient/Guardian expressed understanding and agreed to proceed.     Alonza Smoker, LCSW 10/02/2021

## 2021-10-11 ENCOUNTER — Other Ambulatory Visit: Payer: Self-pay | Admitting: Family Medicine

## 2021-10-16 ENCOUNTER — Other Ambulatory Visit: Payer: Self-pay

## 2021-10-16 ENCOUNTER — Ambulatory Visit (INDEPENDENT_AMBULATORY_CARE_PROVIDER_SITE_OTHER): Payer: PPO | Admitting: Psychiatry

## 2021-10-16 DIAGNOSIS — F321 Major depressive disorder, single episode, moderate: Secondary | ICD-10-CM

## 2021-10-16 NOTE — Plan of Care (Signed)
Pt participated in treatment plan review ?

## 2021-10-16 NOTE — Progress Notes (Signed)
Virtual Visit via Telephone Note ? ?I connected with Meredith Owens on 10/16/21 at 10:10 AM EST by telephone and verified that I am speaking with the correct person using two identifiers. ? ?Location: ?Patient: Home ?Provider: [place] office  ?  ?I discussed the limitations, risks, security and privacy concerns of performing an evaluation and management service by telephone and the availability of in person appointments. I also discussed with the patient that there may be a patient responsible charge related to this service. The patient expressed understanding and agreed to proceed. ? ? ? ?I provided 46 minutes of non-face-to-face time during this encounter. ? ? ?Kentley Blyden E Adalee Kathan, LCSW ? ? ? ?THERAPIST PROGRESS NOTE ? ?Session Time: Friday  3/10//2023 10:10 AM - 10:56 AM  ? ?Participation Level: Active ? ?Behavioral Response: Alert/less anxious, less depressed ? ?Type of Therapy: Individual Therapy ? ?Treatment Goals addressed:    Identify cognitive patterns and beliefs that support depression ? ?Progress on Goals:  progressing ? ?Interventions: CBT and Supportive ? ?Summary: Meredith Owens is a 81 y.o. female who is referred for services by LCSW Casimer Lanius due to patient experiencing symptoms of depression as well as anxiety.  She denies any psychiatric hospitalizations or involvement in outpatient therapy.  Patient states being concerned about her physical health particularly weight loss, her mental health as she forgets things, and dental health.  Patient states living by herself and feeling alone.  She also reports excessive worry, feelings of hopelessness, worthlessness, fatigue, and tearfulness. ? ?Patient last was seen via virtual visit about 2  weeks ago.  She reports decreased intensity, frequency and duration of symptoms of depression.  She reports improved mood especially this month as she recently celebrated her 81th birthday.  She enjoyed going out to eat with her family.  She continues  to receive birthday wishes and phone calls from other family members and friends.  She continues to express concern about health issues including blood pressure and problems with her eye.  She is particularly concerned about her eye as she has 1 prosthetic eye and has what if thoughts about treatment for her other eye and possibility of being blind.  She is scheduled to see a specialist at [facility] the latter part of this year.  She still has concerns about whether or not she will be able to attend her son's 60th birthday party but expresses less worry.  She reports continued strong support from her family.  ? ?Suicidal/Homicidal: Nowithout intent/plan ? ?Therapist Response: Reviewed symptoms, praised and reinforced patient's behavioral activation and socialization with family, discussed effects on thoughts and mood, discussed stressors, facilitated expression of thoughts and feelings, validated feelings, assisted patient explore worry thoughts about her eye, assisted patient identify replacement statements to cope with worry thoughts, discussed patient's progress in treatment, reviewed treatment plan, obtain patient's permission to electronically sign plan as this was a virtual visit, began to discuss next steps for treatment  ?Plan: Return again in 2 weeks. ? ?Diagnosis: Axis I: Major depressive disorder, single episode, moderate ? ? Collaboration of Care: Other none needed at this session ? ?Patient/Guardian was advised Release of Information must be obtained prior to any record release in order to collaborate their care with an outside provider. Patient/Guardian was advised if they have not already done so to contact the registration department to sign all necessary forms in order for [place] to release information regarding their care.  ? ?Consent: Patient/Guardian gives verbal consent for treatment and assignment of benefits  for services provided during this visit. Patient/Guardian expressed understanding and agreed to  proceed.   ? ? ?Yurika Pereda E Jarren Para, LCSW ?10/16/2021 ? ? ? ? ? ? ?

## 2021-10-30 ENCOUNTER — Other Ambulatory Visit: Payer: Self-pay

## 2021-10-30 ENCOUNTER — Ambulatory Visit (INDEPENDENT_AMBULATORY_CARE_PROVIDER_SITE_OTHER): Payer: PPO | Admitting: Psychiatry

## 2021-10-30 DIAGNOSIS — F321 Major depressive disorder, single episode, moderate: Secondary | ICD-10-CM

## 2021-10-30 NOTE — Progress Notes (Signed)
Virtual Visit via Telephone Note ? ?I connected with Meredith Owens on 10/30/21 at 10:10 AM EDT by telephone and verified that I am speaking with the correct person using two identifiers. ? ?Location: ?Patient: Home ?Provider: [facility] [place] office  ?  ?I discussed the limitations, risks, security and privacy concerns of performing an evaluation and management service by telephone and the availability of in person appointments. I also discussed with the patient that there may be a patient responsible charge related to this service. The patient expressed understanding and agreed to proceed. ? ? ? ?I provided 44 minutes of non-face-to-face time during this encounter. ? ? ?Obert Espindola E Tanique Matney, LCSW ? ? ? ? ?THERAPIST PROGRESS NOTE ? ?Session Time: Friday  3/24//2023 10:10 AM - 10:54 AM  ? ?Participation Level: Active ? ?Behavioral Response: Alert/less anxious, less depressed ? ?Type of Therapy: Individual Therapy ? ?Treatment Goals addressed:    Identify cognitive patterns and beliefs that support depression ? ?Progress on Goals:  progressing ? ?Interventions: CBT and Supportive ? ?Summary: Meredith Owens is a 81 y.o. female who is referred for services by LCSW Casimer Lanius due to patient experiencing symptoms of depression as well as anxiety.  She denies any psychiatric hospitalizations or involvement in outpatient therapy.  Patient states being concerned about her physical health particularly weight loss, her mental health as she forgets things, and dental health.  Patient states living by herself and feeling alone.  She also reports excessive worry, feelings of hopelessness, worthlessness, fatigue, and tearfulness. ? ?Patient last was seen via virtual visit about 2  weeks ago.  She reports continued decreased intensity, frequency and duration of symptoms of depression.  She reports experiencing 1 incident that triggered patient initially feeling a little down.  She reports a minor issue with her son regarding  her attendance at his birthday party in April.  However, patient reports becoming aware of her thoughts and replacing negative thoughts with more helpful/helpful thoughts.  She is very pleased with her efforts regarding this and states now being determined to go to her son's birthday party.  She reports mainly being in a positive mood.  She has maintained involvement with family and friends via telephone.  She also reports recently enjoying having a birthday celebration at her home with her 2 best friends.  Patient reports she has not had any more worry thoughts about her eye.  ? ?Suicidal/Homicidal: Nowithout intent/plan ? ?Therapist Response: Reviewed symptoms, praised and reinforced patient's continued behavioral activation and socialization with family, discussed effects on thoughts and mood, discussed stressors, facilitated expression of thoughts and feelings, validated feelings, praised and reinforced patient's recognition of depressive/negative thoughts and use of replacement statements regarding situation with son, discussed effects , developed plan with patient to continue identify and replacing depressive thoughts, also developed plan with patient to continue practicing deep breathing ? ?Plan: Return again in 2 weeks. ? ?Diagnosis: Axis I: Major depressive disorder, single episode, moderate ? ? Collaboration of Care: Other none needed at this session ? ?Patient/Guardian was advised Release of Information must be obtained prior to any record release in order to collaborate their care with an outside provider. Patient/Guardian was advised if they have not already done so to contact the registration department to sign all necessary forms in order for [place] to release information regarding their care.  ? ?Consent: Patient/Guardian gives verbal consent for treatment and assignment of benefits for services provided during this visit. Patient/Guardian expressed understanding and agreed to proceed.   ? ? ?  Yenesis Even E  Quandre Polinski, LCSW ?10/30/2021 ? ? ? ? ? ? ?

## 2021-11-03 ENCOUNTER — Ambulatory Visit
Admission: RE | Admit: 2021-11-03 | Discharge: 2021-11-03 | Disposition: A | Discharge status: Home / Self Care | Payer: PPO | Source: Ambulatory Visit | Attending: Family Medicine | Admitting: Family Medicine

## 2021-11-03 DIAGNOSIS — Z1231 Encounter for screening mammogram for malignant neoplasm of breast: Secondary | ICD-10-CM

## 2021-11-04 ENCOUNTER — Encounter: Payer: Self-pay | Admitting: Gastroenterology

## 2021-11-04 ENCOUNTER — Ambulatory Visit: Payer: PPO | Admitting: Gastroenterology

## 2021-11-04 VITALS — BP 146/78 | HR 56 | Ht 61.0 in | Wt 112.0 lb

## 2021-11-04 DIAGNOSIS — Z86010 Personal history of colonic polyps: Secondary | ICD-10-CM

## 2021-11-04 DIAGNOSIS — R634 Abnormal weight loss: Secondary | ICD-10-CM
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
## 2021-11-04 NOTE — Patient Instructions (Addendum)
If you are age 80 or older, your body mass index should be between 23-30. Your Body mass index is 21.16 kg/m?Marland Kitchen If this is out of the aforementioned range listed, please consider follow up with your Primary Care Provider. ?________________________________________________________ ? ?The [facility] providers would like to encourage you to use [facility] to communicate with providers for non-urgent requests or questions.  Due to long hold times on the telephone, sending your provider a message by [facility] may be a faster and more efficient way to get a response.  Please allow 48 business hours for a response.  Please remember that this is for non-urgent requests.  ?_______________________________________________________ ? ?It has been recommended to you by your physician that you have an endoscopy and colonoscopy completed. Per your request, we did not schedule the procedure(s) today. Please contact our office at [phone] should you decide to have the procedure completed. You will be scheduled for a pre-visit and procedure at that time. ? ?Due to recent changes in healthcare laws, you may see the results of your imaging and laboratory studies on MyChart before your provider has had a chance to review them.  We understand that in some cases there may be results that are confusing or concerning to you. Not all laboratory results come back in the same time frame and the provider may be waiting for multiple results in order to interpret others.  Please give [place] 48 hours in order for your provider to thoroughly review all the results before contacting the office for clarification of your results.  ? ?Thank you for entrusting me with your care and choosing [facility]. ? ?Dr Ardis Hughs ? ?

## 2021-11-13 ENCOUNTER — Ambulatory Visit (INDEPENDENT_AMBULATORY_CARE_PROVIDER_SITE_OTHER): Payer: PPO | Admitting: Psychiatry

## 2021-11-13 DIAGNOSIS — F321 Major depressive disorder, single episode, moderate: Secondary | ICD-10-CM | POA: Diagnosis not present

## 2021-11-13 NOTE — Progress Notes (Signed)
Virtual Visit via Telephone Note ? ?I connected with Meredith Owens on 11/13/21 at 10:11 AM EDT  by telephone and verified that I am speaking with the correct person using two identifiers. ? ?Location: ?Patient: Home ?Provider: [facility] [place] office  ?  ?I discussed the limitations, risks, security and privacy concerns of performing an evaluation and management service by telephone and the availability of in person appointments. I also discussed with the patient that there may be a patient responsible charge related to this service. The patient expressed understanding and agreed to proceed. ? ? ? ? ?I provided 43 minutes of non-face-to-face time during this encounter. ? ? ?Berlin Mokry E Navah Grondin, LCSW ? ? ? ?THERAPIST PROGRESS NOTE ? ?Session Time: Friday  4/7//2023 10:11 AM - 10:54 AM  ? ?Participation Level: Active ? ?Behavioral Response: Alert/less anxious, less depressed ? ?Type of Therapy: Individual Therapy ? ?Treatment Goals addressed:    Identify cognitive patterns and beliefs that support depression ? ?Progress on Goals:  progressing ? ?Interventions: CBT and Supportive ? ?Summary: Meredith Owens is a 81 y.o. female who is referred for services by LCSW Casimer Lanius due to patient experiencing symptoms of depression as well as anxiety.  She denies any psychiatric hospitalizations or involvement in outpatient therapy.  Patient states being concerned about her physical health particularly weight loss, her mental health as she forgets things, and dental health.  Patient states living by herself and feeling alone.  She also reports excessive worry, feelings of hopelessness, worthlessness, fatigue, and tearfulness. ? ?Patient last was seen via virtual visit about 2  weeks ago.  She reports continued decreased intensity, frequency and duration of symptoms of depression.  She reports experiencing feeling a little down regarding recent consultation with GI about colonic polyps.  She states knowing she knew  something was not right due to her weight loss as well as eating patterns.  However, she reports feeling better as she has focused on identifying things she could do differently such as changing her eating patterns.  She has initiated contact with [employer] Whe[employer] and anticipates beginning to receive services in the near future.  She also reports having some feelings about son not initiating contact with her since there were conflict about her attendance at his birthday party.  However, she reports not dwelling on this but focusing on things within her control such as initiating contact with son.  She has done this and reports no worry thoughts about this.  She is looking forward to attending his birthday party at the end of this month.  .  ? ?Suicidal/Homicidal: Nowithout intent/plan ? ?Therapist Response: Reviewed symptoms, praised and reinforced patient's continued behavioral activation and socialization with family, discussed effects on thoughts and mood, discussed stressors, facilitated expression of thoughts and feelings, validated feelings, praised and reinforced patient's recognition of depressive/negative thoughts and use of replacement statements regarding her health and situation with son, discussed effects on her mood and behavior, assisted patient explore ways to improve eating patterns and ways to use her support system and getting nutritious meals, also praised and reinforced patient's efforts/initiative in contacting [employer] Wheels           ?Plan: Return again in 2 weeks. ? ?Diagnosis: Axis I: Major depressive disorder, single episode, moderate ? ? Collaboration of Care: Other none needed at this session ? ?Patient/Guardian was advised Release of Information must be obtained prior to any record release in order to collaborate their care with an outside provider.  Patient/Guardian was advised if they have not already done so to contact the registration department to sign all necessary forms in order  for [place] to release information regarding their care.  ? ?Consent: Patient/Guardian gives verbal consent for treatment and assignment of benefits for services provided during this visit. Patient/Guardian expressed understanding and agreed to proceed.   ? ? ?Alonza Smoker, LCSW ?11/13/2021 ? ? ? ? ? ? ? ?

## 2021-11-27 ENCOUNTER — Ambulatory Visit (HOSPITAL_COMMUNITY): Payer: PPO | Admitting: Psychiatry

## 2021-12-03 ENCOUNTER — Telehealth: Payer: Self-pay | Admitting: Gastroenterology

## 2021-12-03 NOTE — Telephone Encounter (Signed)
Inbound call from patient daughter, Vaughan Sine. Requesting a call from to go over prep instructions for upcoming procedure 5/3. Looks like PV was scheduled but was told it needed to be cancelled because of recent OV. Best contact number [phone] ?

## 2021-12-03 NOTE — Telephone Encounter (Signed)
Spoke with patients daughter [name] regarding Colonoscopy prep instructions.  Instructions were reviewed by phone, and [name] ask that MyChart invite be sent so that instructions could be accessed by MyChart.  Instructions were also printed and left at front desk for pick up.  Patients daughter [name] verbalized understanding.  No further questions.  ?

## 2021-12-09 ENCOUNTER — Telehealth: Payer: Self-pay | Admitting: Gastroenterology

## 2021-12-09 ENCOUNTER — Encounter: Payer: PPO | Admitting: Gastroenterology

## 2021-12-09 ENCOUNTER — Telehealth (HOSPITAL_COMMUNITY): Payer: Self-pay | Admitting: *Deleted

## 2021-12-09 NOTE — Telephone Encounter (Signed)
Called and to inform her that her appt for 12-11-2021 will be cancelled due to provider not being in the office and staff was not able to reach her. Patient do not have voicemail. Recorder only offered remote access.  ?

## 2021-12-09 NOTE — Telephone Encounter (Signed)
Okay; thanks.

## 2021-12-09 NOTE — Telephone Encounter (Signed)
Patient's daughter called to cancel patient's procedures for today.  She said patient is having memory problems and she thinks she may be having a stroke and is going to take her to the hospital.  She will reschedule later. ?

## 2021-12-10 ENCOUNTER — Encounter: Payer: Self-pay | Admitting: Family Medicine

## 2021-12-10 ENCOUNTER — Ambulatory Visit (INDEPENDENT_AMBULATORY_CARE_PROVIDER_SITE_OTHER): Payer: PPO | Admitting: Family Medicine

## 2021-12-10 VITALS — BP 152/56 | HR 52 | Ht 61.0 in | Wt 111.4 lb

## 2021-12-10 DIAGNOSIS — I10 Essential (primary) hypertension: Secondary | ICD-10-CM

## 2021-12-10 DIAGNOSIS — R4182 Altered mental status, unspecified: Secondary | ICD-10-CM | POA: Diagnosis not present

## 2021-12-10 NOTE — Progress Notes (Signed)
? ? ?  SUBJECTIVE:  ? ?CHIEF COMPLAINT / HPI: acute change in memory ? ?Patient presents to clinic with daughter.  History obtained mostly from daughter given concerns for acute memory change.  Daughter reports worsening memory loss for 1 week.  She feels mom is more confused.  Reports that mom could not remember her visiting and feels like she lost time between few visits.  Daughter reports having to prompt mom when asked what she had for meals, having to repeat herself more often to explain things to her mother.  Denies any slurred speech, visual changes, upper or lower extremity weakness, seizure-like activity, loss of consciousness, falls, fevers, chest pain, shortness of breath.  Ms. Hodgkiss does not recall having any of the symptoms.  She does live alone currently with the plan to move in with her daughter soon.  She reports being able to cook for herself but endorses a decrease in appetite recently.  She also endorses having mild intermittent headaches. ? ?PERTINENT  PMH / PSH:  ?Mild cognitive impairment, MOCA 20/30 in 2019 ? ?OBJECTIVE:  ? ?BP (!) 152/56   Pulse (!) 52   Ht '5\' 1"'$  (1.549 m)   Wt 111 lb 6 oz (50.5 kg)   SpO2 99%   BMI 21.04 kg/m?   ? ?General: Alert, no acute distress, oriented to self ?Cardio: Normal S1 and S2, RRR, no r/m/g ?Pulm: CTAB, normal work of breathing ?Abdomen: Bowel sounds normal. Abdomen soft and non-tender.  ?Extremities: No peripheral edema.  ? ?Neuro: ?CN HT:DSKA pupil reactive, right eye prosthesis ?CN III, IV,VI: EOMI ?CV V: Normal sensation in V1, V2, V3 ?CVII: Symmetric smile and brow raise ?CN VIII: Normal hearing ?CN IX,X: Symmetric palate raise  ?CN XI: 5/5 shoulder shrug ?CN XII: Symmetric tongue protrusion  ?UE and LE strength 5/5 ?2+ UE and LE reflexes  ?Normal sensation in UE and LE bilaterally  ?No ataxia with finger to nose, normal heel to shin  ?Negative Rhomberg   ? ? ?ASSESSMENT/PLAN:  ? ?Change in mental state ?Differentials include TIA, delirium, SDH,  decline in cognitive impairment.  No focal deficits on neurologic exam today.  Given the patient lives alone cannot rule out recent TIA. ?CT head without contrast scheduled for 05/11 ?CBC/CMet today ?Follow-up with results ?Follow-up in 2 weeks with PCP ?Once acute etiologies have been ruled out could consider repeat Moca and revisit initiation of Aricept with patient and daughter. ?Strict return precautions provided. ?  ? ? ?Carollee Leitz, MD ?[place]  ?

## 2021-12-10 NOTE — Patient Instructions (Addendum)
Thank you for coming to see me today. It was a pleasure. ? ? ?Please follow-up with PCP in 1-2 weeks ? ?If you have any questions or concerns, please do not hesitate to call the office at [phone]. ? ?Best,  ? ?Carollee Leitz, MD   ?

## 2021-12-11 ENCOUNTER — Telehealth: Payer: Self-pay

## 2021-12-11 ENCOUNTER — Ambulatory Visit (HOSPITAL_COMMUNITY): Payer: PPO | Admitting: Psychiatry

## 2021-12-11 LAB — COMPREHENSIVE METABOLIC PANEL
ALT: 13 IU/L (ref 0–32)
AST: 27 IU/L (ref 0–40)
Albumin/Globulin Ratio: 1.5 (ref 1.2–2.2)
Albumin: 4.3 g/dL (ref 3.7–4.7)
Alkaline Phosphatase: 48 IU/L (ref 44–121)
BUN/Creatinine Ratio: 14 (ref 12–28)
BUN: 15 mg/dL (ref 8–27)
Bilirubin Total: 0.3 mg/dL (ref 0.0–1.2)
CO2: 26 mmol/L (ref 20–29)
Calcium: 10.2 mg/dL (ref 8.7–10.3)
Chloride: 95 mmol/L — ABNORMAL LOW (ref 96–106)
Creatinine, Ser: 1.09 mg/dL — ABNORMAL HIGH (ref 0.57–1.00)
Globulin, Total: 2.9 g/dL (ref 1.5–4.5)
Glucose: 83 mg/dL (ref 70–99)
Potassium: 4.2 mmol/L (ref 3.5–5.2)
Sodium: 136 mmol/L (ref 134–144)
Total Protein: 7.2 g/dL (ref 6.0–8.5)
eGFR: 51 mL/min/{1.73_m2} — ABNORMAL LOW (ref >59)

## 2021-12-11 LAB — CBC
Hematocrit: 37.3 % (ref 34.0–46.6)
Hemoglobin: 11.9 g/dL (ref 11.1–15.9)
MCH: 24.3 pg — ABNORMAL LOW (ref 26.6–33.0)
MCHC: 31.9 g/dL (ref 31.5–35.7)
MCV: 76 fL — ABNORMAL LOW (ref 79–97)
Platelets: 289 10*3/uL (ref 150–450)
RBC: 4.89 x10E6/uL (ref 3.77–5.28)
RDW: 14.3 % (ref 11.7–15.4)
WBC: 8 10*3/uL (ref 3.4–10.8)

## 2021-12-11 NOTE — Telephone Encounter (Signed)
Attempted to reach patients daughter. No answer. LVM of CT scan appointment  at [facility] for Thur May 11th at 11:15a. Salvatore Marvel, CMA ? ?

## 2021-12-13 ENCOUNTER — Encounter: Payer: Self-pay | Admitting: Family Medicine

## 2021-12-13 DIAGNOSIS — R4182 Altered mental status, unspecified: Secondary | ICD-10-CM | POA: Insufficient documentation

## 2021-12-13 NOTE — Assessment & Plan Note (Signed)
Differentials include TIA, delirium, SDH, decline in cognitive impairment.  No focal deficits on neurologic exam today.  Given the patient lives alone cannot rule out recent TIA. ?CT head without contrast scheduled for 05/11 ?CBC/CMet today ?Follow-up with results ?Follow-up in 2 weeks with PCP ?Once acute etiologies have been ruled out could consider repeat Moca and revisit initiation of Aricept with patient and daughter. ?Strict return precautions provided. ?

## 2021-12-17 ENCOUNTER — Ambulatory Visit (HOSPITAL_COMMUNITY)
Admission: RE | Admit: 2021-12-17 | Discharge: 2021-12-17 | Disposition: A | Discharge status: Home / Self Care | Payer: PPO | Source: Ambulatory Visit | Attending: Family Medicine | Admitting: Family Medicine

## 2021-12-17 DIAGNOSIS — R4182 Altered mental status, unspecified: Secondary | ICD-10-CM

## 2021-12-21 ENCOUNTER — Other Ambulatory Visit: Payer: Self-pay | Admitting: Family Medicine

## 2021-12-21 ENCOUNTER — Telehealth (HOSPITAL_COMMUNITY): Payer: Self-pay | Admitting: *Deleted

## 2021-12-21 ENCOUNTER — Telehealth: Payer: Self-pay

## 2021-12-21 NOTE — Telephone Encounter (Signed)
Per pt she is not able to do her appts with [name] right now due to her medical issues that are happening and she's waiting for results from her CAT Scan as to why she went confused. Per pt she thinks it was the Colonoscopy liquid she had to take before the Colonoscopy. Per pt she will call office when she gets up to part to schedule more appt with provider. Cell number [phone]. ?

## 2021-12-21 NOTE — Telephone Encounter (Signed)
Discussed results with grand daughter [name]. ? ?Thank you

## 2021-12-21 NOTE — Telephone Encounter (Signed)
Patients daughter calls nurse line requesting recent imaging results.  ? ?Daughter reports she was told to make an apt with PCP, this has been scheduled for June.  ? ?Daughter would like results before this apt.  ?

## 2021-12-24 NOTE — Telephone Encounter (Signed)
Called and left voice message for patient's daughter Meredith Owens per Dr. Alba Cory.  [name], [place]

## 2022-01-01 DIAGNOSIS — Z970 Presence of artificial eye: Secondary | ICD-10-CM | POA: Diagnosis not present

## 2022-01-01 DIAGNOSIS — H02422 Myogenic ptosis of left eyelid: Secondary | ICD-10-CM | POA: Diagnosis not present

## 2022-01-01 DIAGNOSIS — H401222 Low-tension glaucoma, left eye, moderate stage: Secondary | ICD-10-CM | POA: Diagnosis not present

## 2022-01-01 DIAGNOSIS — Z961 Presence of intraocular lens: Secondary | ICD-10-CM | POA: Diagnosis not present

## 2022-01-01 DIAGNOSIS — H16222 Keratoconjunctivitis sicca, not specified as Sjogren's, left eye: Secondary | ICD-10-CM | POA: Diagnosis not present

## 2022-01-01 DIAGNOSIS — H43812 Vitreous degeneration, left eye: Secondary | ICD-10-CM | POA: Diagnosis not present

## 2022-01-10 NOTE — Progress Notes (Signed)
SUBJECTIVE:   CHIEF COMPLAINT / HPI: f/u for memory concerns  Patient presents today with her daughter, Meredith Owens, sons Meredith Owens "Meredith Owens" and Meredith Owens.   They were previously evaluated for acute memory changes noticed in Meredith Owens. They would like to further discuss the results of head imaging and blood work collected at that time.   Today they state they have noticed a change in patient's memory.  They state that the patient was scheduled for colonoscopy and was indecisive about whether or not she wanted to proceed with this.  Patient spoke with her son, Meredith Owens, and stated that she did not want to have this  procedure and so her son advised the patient to contact the office to cancel the appointment.  The next day, after her scheduled appointment time, the family realized that she had not canceled appointment because she had forgotten that that was the agreement.  The patient also did not remember having a conversation about canceling the appointment or that she needed to call.  They were concerned that she had essentially "lost time" as far as her memory because she did not recall events of that previous day.  They state that the patient was previously living  alone but has recently moved in with both her daughter and son (she alternates between the two houses).  Patient has also been eating less but this has improved while living with others.  They have also discussed a trial of Aricept medication and wanted to have more information to review the medication and potential side effects.   Request for Podiatry  Patient's daughter requests that the patient can be referred to podiatry for toe nail maintenance. They state that the patient is unable to groom her toenails independently.   PERTINENT  PMH / PSH:  HTN  Goiter  Cognitive Memory impairment  Osteopenia    OBJECTIVE:   BP (!) 178/59   Pulse (!) 59   Ht '5\' 1"'$  (1.549 m)   Wt 109 lb 4 oz (49.6 kg)   SpO2 97%   BMI 20.64 kg/m   Physical  Exam Constitutional:      Appearance: She is normal weight.  HENT:     Right Ear: External ear normal.     Left Ear: External ear normal.     Nose: Nose normal.     Mouth/Throat:     Pharynx: Oropharynx is clear.  Cardiovascular:     Rate and Rhythm: Normal rate and regular rhythm.  Pulmonary:     Effort: Pulmonary effort is normal.  Abdominal:     General: Bowel sounds are normal.     Palpations: Abdomen is soft.  Musculoskeletal:     Cervical back: Normal range of motion.  Skin:    General: Skin is warm.  Neurological:     General: No focal deficit present.     Mental Status: She is alert and oriented to person, place, and time.     ASSESSMENT/PLAN:   Cold intolerance Family requests to have iron levels checked as patient is constantly reporting feeling cold.  Reviewed prior CBC, normal hgb at that time.  Will check ferritin and iron levels  TSH  Impairment of cognitive function MOCA completed today with score of 15/30 Family would like to try Aricept medication, prescribed '5mg'$  at bedtime  Will follow up in 4 weeks to assess how patient is doing  Handout given for family to review potential side effects as requested   Hypertrophic toenail Referral for podiatry  submitted per family request      Eulis Foster, MD [place]

## 2022-01-11 ENCOUNTER — Ambulatory Visit (INDEPENDENT_AMBULATORY_CARE_PROVIDER_SITE_OTHER): Payer: PPO | Admitting: Family Medicine

## 2022-01-11 ENCOUNTER — Encounter: Payer: Self-pay | Admitting: Family Medicine

## 2022-01-11 VITALS — BP 178/59 | HR 59 | Ht 61.0 in | Wt 109.2 lb

## 2022-01-11 DIAGNOSIS — R4189 Other symptoms and signs involving cognitive functions and awareness: Secondary | ICD-10-CM

## 2022-01-11 DIAGNOSIS — L602 Onychogryphosis: Secondary | ICD-10-CM | POA: Diagnosis not present

## 2022-01-11 DIAGNOSIS — R6889 Other general symptoms and signs: Secondary | ICD-10-CM

## 2022-01-11 MED ORDER — DONEPEZIL HCL 5 MG PO TBDP: 5.0000 mg | ORAL_TABLET | Freq: Every day | ORAL | 0 refills | Status: DC

## 2022-01-11 NOTE — Patient Instructions (Addendum)
Today we were able to meet to discuss memory impairment and signs of cognitive decline.  The scoring assessment that we completed today indicates moderate cognitive impairment.  We discussed starting a medication called Aricept or donezepil.  This medication can be taken nightly at bedtime.  Please read below for potential side effects to be on the look out for.  We will see you for follow-up on June 22 at 1:30 PM.  We will also check your thyroid function and iron levels.  Donepezil Disintegrating Tablets What is this medication? DONEPEZIL (doe NEP e zil) treats memory loss and confusion (dementia) in people who have Alzheimer disease. It works by improving attention, memory, and the ability to engage in daily activities. It is not a cure for dementia or Alzheimer disease. This medicine may be used for other purposes; ask your health care provider or pharmacist if you have questions. COMMON BRAND NAME(S): Aricept What should I tell my care team before I take this medication? They need to know if you have any of these conditions: Asthma or other lung disease Difficulty passing urine Head injury Heart disease History of irregular heartbeat Liver disease Seizures (convulsions) Stomach or intestinal disease, ulcers or stomach bleeding An unusual or allergic reaction to donepezil, other medications, foods, dyes, or preservatives Pregnant or trying to get pregnant Breast-feeding How should I use this medication? Take this medication by mouth. Follow the directions on the prescription label. Place the tablet in the mouth and allow it to dissolve, then swallow. While you may take these tablets with water, it is not necessary to do so. You may take this medication with or without food. Take your doses at regular intervals. This medication is usually taken before bedtime. Do not take your medication more often than directed. Continue to take your medication even if you feel better. Do not stop  taking except on the advice of your care team. Talk to your care team about the use of this medication in children. Special care may be needed. Overdosage: If you think you have taken too much of this medicine contact a poison control center or emergency room at once. NOTE: This medicine is only for you. Do not share this medicine with others. What if I miss a dose? If you miss a dose, take it as soon as you can. If it is almost time for your next dose, take only that dose. Do not take double or extra doses. What may interact with this medication? Do not take this medication with any of the following: Certain medications for fungal infections like itraconazole, fluconazole, posaconazole, and voriconazole Cisapride Dextromethorphan; quinidine Dronedarone Pimozide Quinidine Thioridazine This medication may also interact with the following: Antihistamines for allergy, cough and cold Atropine Bethanechol Carbamazepine Certain medications for bladder problems like oxybutynin, tolterodine Certain medications for Parkinson's disease like benztropine, trihexyphenidyl Certain medications for stomach problems like dicyclomine, hyoscyamine Certain medications for travel sickness like scopolamine Dexamethasone Dofetilide Ipratropium NSAIDs, medications for pain and inflammation, like ibuprofen or naproxen Other medications for Alzheimer's disease Other medications that prolong the QT interval (cause an abnormal heart rhythm) Phenobarbital Phenytoin Rifampin, rifabutin or rifapentine Ziprasidone This list may not describe all possible interactions. Give your health care provider a list of all the medicines, herbs, non-prescription drugs, or dietary supplements you use. Also tell them if you smoke, drink alcohol, or use illegal drugs. Some items may interact with your medicine. What should I watch for while using this medication? Visit your care team for regular  checks on your progress. Check  with your care team if your symptoms do not get better or if they get worse. You may get drowsy or dizzy. Do not drive, use machinery, or do anything that needs mental alertness until you know how this medication affects you. What side effects may I notice from receiving this medication? Side effects that you should report to your care team as soon as possible: Allergic reactions--skin rash, itching, hives, swelling of the face, lips, tongue, or throat Peptic ulcer--burning stomach pain, loss of appetite, bloating, burping, heartburn, nausea, vomiting Seizures Slow heartbeat--dizziness, feeling faint or lightheaded, confusion, trouble breathing, unusual weakness or fatigue Stomach bleeding--bloody or black, tar-like stools, vomiting blood or brown material that looks like coffee grounds Trouble passing urine Side effects that usually do not require medical attention (report these to your care team if they continue or are bothersome): Diarrhea Fatigue Loss of appetite Muscle pain or cramps Nausea Trouble sleeping This list may not describe all possible side effects. Call your doctor for medical advice about side effects. You may report side effects to FDA at 1-800-FDA-1088. Where should I keep my medication? Keep out of reach of children. Store at room temperature between 15 and 30 degrees C (59 and 86 degrees F). Throw away any unused medication after the expiration date. NOTE: This sheet is a summary. It may not cover all possible information. If you have questions about this medicine, talk to your doctor, pharmacist, or health care provider.  2023 Elsevier/Gold Standard (2021-03-11 00:00:00)

## 2022-01-12 ENCOUNTER — Encounter: Payer: Self-pay | Admitting: *Deleted

## 2022-01-12 LAB — IRON,TIBC AND FERRITIN PANEL
Ferritin: 55 ng/mL (ref 15–150)
Iron Saturation: 26 % (ref 15–55)
Iron: 71 ug/dL (ref 27–139)
Total Iron Binding Capacity: 276 ug/dL (ref 250–450)
UIBC: 205 ug/dL (ref 118–369)

## 2022-01-12 LAB — TSH: TSH: 1.5 u[IU]/mL (ref 0.450–4.500)

## 2022-01-13 ENCOUNTER — Ambulatory Visit (HOSPITAL_COMMUNITY): Payer: PPO | Admitting: Psychiatry

## 2022-01-13 DIAGNOSIS — L602 Onychogryphosis: Secondary | ICD-10-CM | POA: Insufficient documentation

## 2022-01-13 NOTE — Assessment & Plan Note (Signed)
Referral for podiatry submitted per family request

## 2022-01-13 NOTE — Assessment & Plan Note (Addendum)
Family requests to have iron levels checked as patient is constantly reporting feeling cold.  Reviewed prior CBC, normal hgb at that time.  Will check ferritin and iron levels  TSH

## 2022-01-13 NOTE — Assessment & Plan Note (Signed)
MOCA completed today with score of 15/30 Family would like to try Aricept medication, prescribed '5mg'$  at bedtime  Will follow up in 4 weeks to assess how patient is doing  Handout given for family to review potential side effects as requested

## 2022-01-15 ENCOUNTER — Other Ambulatory Visit: Payer: Self-pay

## 2022-01-15 MED ORDER — VALSARTAN-HYDROCHLOROTHIAZIDE 80-12.5 MG PO TABS
1.0000 | ORAL_TABLET | Freq: Every day | ORAL | 1 refills | Status: DC
Start: 2022-01-15 — End: 2022-07-26

## 2022-01-28 ENCOUNTER — Ambulatory Visit: Payer: PPO | Admitting: Family Medicine

## 2022-01-29 ENCOUNTER — Ambulatory Visit: Payer: PPO | Admitting: Podiatry

## 2022-01-29 ENCOUNTER — Telehealth: Payer: Self-pay | Admitting: Family Medicine

## 2022-01-29 ENCOUNTER — Encounter: Payer: Self-pay | Admitting: Podiatry

## 2022-01-29 DIAGNOSIS — M2012 Hallux valgus (acquired), left foot: Secondary | ICD-10-CM | POA: Diagnosis not present

## 2022-01-29 DIAGNOSIS — M79675 Pain in left toe(s): Secondary | ICD-10-CM | POA: Diagnosis not present

## 2022-01-29 DIAGNOSIS — B351 Tinea unguium: Secondary | ICD-10-CM

## 2022-01-29 DIAGNOSIS — M79674 Pain in right toe(s): Secondary | ICD-10-CM | POA: Diagnosis not present

## 2022-01-29 DIAGNOSIS — M2011 Hallux valgus (acquired), right foot: Secondary | ICD-10-CM | POA: Diagnosis not present

## 2022-01-29 NOTE — Telephone Encounter (Signed)
Patient's son dropped off leave of absence form to be completed. Last DOS was 01/11/22. Placed in [employer].

## 2022-02-04 ENCOUNTER — Other Ambulatory Visit: Payer: Self-pay | Admitting: Family Medicine

## 2022-02-07 NOTE — Telephone Encounter (Signed)
Form completed and placed in front office side folder for family pick up.
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
## 2022-02-08 NOTE — Telephone Encounter (Signed)
Form placed up front for pick up.   Copy made for batch scanning.   Patients son has been made aware.

## 2022-02-11 ENCOUNTER — Other Ambulatory Visit: Payer: Self-pay | Admitting: Family Medicine

## 2022-02-18 ENCOUNTER — Other Ambulatory Visit: Payer: Self-pay | Admitting: Family Medicine

## 2022-03-11 DIAGNOSIS — Z6820 Body mass index (BMI) 20.0-20.9, adult: Secondary | ICD-10-CM | POA: Diagnosis not present

## 2022-03-11 DIAGNOSIS — Z1321 Encounter for screening for nutritional disorder: Secondary | ICD-10-CM | POA: Diagnosis not present

## 2022-03-11 DIAGNOSIS — R634 Abnormal weight loss: Secondary | ICD-10-CM | POA: Diagnosis not present

## 2022-03-11 DIAGNOSIS — I10 Essential (primary) hypertension: Secondary | ICD-10-CM | POA: Diagnosis not present

## 2022-03-11 DIAGNOSIS — R630 Anorexia: Secondary | ICD-10-CM | POA: Diagnosis not present

## 2022-03-11 DIAGNOSIS — R5383 Other fatigue: Secondary | ICD-10-CM | POA: Diagnosis not present

## 2022-03-11 DIAGNOSIS — K5909 Other constipation: Secondary | ICD-10-CM | POA: Diagnosis not present

## 2022-03-11 DIAGNOSIS — Z8719 Personal history of other diseases of the digestive system: Secondary | ICD-10-CM | POA: Diagnosis not present

## 2022-03-11 DIAGNOSIS — Z13220 Encounter for screening for lipoid disorders: Secondary | ICD-10-CM | POA: Diagnosis not present

## 2022-03-14 ENCOUNTER — Other Ambulatory Visit: Payer: Self-pay | Admitting: Family Medicine

## 2022-03-17 ENCOUNTER — Other Ambulatory Visit: Payer: Self-pay | Admitting: Family Medicine
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
## 2022-03-19 ENCOUNTER — Ambulatory Visit: Payer: PPO | Admitting: Nurse Practitioner

## 2022-03-19 ENCOUNTER — Encounter: Payer: Self-pay | Admitting: Nurse Practitioner

## 2022-03-19 VITALS — BP 112/62 | HR 60 | Ht 60.0 in | Wt 103.4 lb

## 2022-03-19 DIAGNOSIS — E7800 Pure hypercholesterolemia, unspecified: Secondary | ICD-10-CM | POA: Diagnosis not present

## 2022-03-19 DIAGNOSIS — R413 Other amnesia: Secondary | ICD-10-CM

## 2022-03-19 DIAGNOSIS — I10 Essential (primary) hypertension: Secondary | ICD-10-CM | POA: Diagnosis not present

## 2022-03-19 DIAGNOSIS — R002 Palpitations: Secondary | ICD-10-CM

## 2022-03-19 MED ORDER — HYDRALAZINE HCL 10 MG PO TABS: 10.0000 mg | ORAL_TABLET | Freq: Three times a day (TID) | ORAL | 3 refills | Status: DC

## 2022-03-19 MED ORDER — ROSUVASTATIN CALCIUM 10 MG PO TABS: 10.0000 mg | ORAL_TABLET | ORAL | 3 refills | Status: DC

## 2022-03-19 NOTE — Patient Instructions (Addendum)
Medication Instructions:  NO CHANGES *If you need a refill on your cardiac medications before your next appointment, please call your pharmacy*   Lab Work: NONE If you have labs (blood work) drawn today and your tests are completely normal, you will receive your results only by: [facility] (if you have MyChart) OR A paper copy in the mail If you have any lab test that is abnormal or we need to change your treatment, we will call you to review the results.   Testing/Procedures:NONE   Follow-Up: At [facility], you and your health needs are our priority.  As part of our continuing mission to provide you with exceptional heart care, we have created designated Provider Care Teams.  These Care Teams include your primary Cardiologist (physician) and Advanced Practice Providers (APPs -  Physician Assistants and Nurse Practitioners) who all work together to provide you with the care you need, when you need it.  We recommend signing up for the patient portal called "MyChart".  Sign up information is provided on this After Visit Summary.  MyChart is used to connect with patients for Virtual Visits (Telemedicine).  Patients are able to view lab/test results, encounter notes, upcoming appointments, etc.  Non-urgent messages can be sent to your provider as well.   To learn more about what you can do with MyChart, go to [email or web address].    Your next appointment:   1 year(s)  The format for your next appointment:   In Person  Provider:   DR Marlou Porch or Ambrose Pancoast, NP         Other Instructions KEEP B/P LOG FOR 2-3 WEEKS AND CALL OR SEND VIA MY CHART READINGS    DASH Eating Plan DASH stands for Dietary Approaches to Stop Hypertension. The DASH eating plan is a healthy eating plan that has been shown to: Reduce high blood pressure (hypertension). Reduce your risk for type 2 diabetes, heart disease, and stroke. Help with weight loss. What are tips for following this  plan? Reading food labels Check food labels for the amount of salt (sodium) per serving. Choose foods with less than 5 percent of the Daily Value of sodium. Generally, foods with less than 300 milligrams (mg) of sodium per serving fit into this eating plan. To find whole grains, look for the word "whole" as the first word in the ingredient list. Shopping Buy products labeled as "low-sodium" or "no salt added." Buy fresh foods. Avoid canned foods and pre-made or frozen meals. Cooking Avoid adding salt when cooking. Use salt-free seasonings or herbs instead of table salt or sea salt. Check with your health care provider or pharmacist before using salt substitutes. Do not fry foods. Cook foods using healthy methods such as baking, boiling, grilling, roasting, and broiling instead. Cook with heart-healthy oils, such as olive, canola, avocado, soybean, or sunflower oil. Meal planning  Eat a balanced diet that includes: 4 or more servings of fruits and 4 or more servings of vegetables each day. Try to fill one-half of your plate with fruits and vegetables. 6-8 servings of whole grains each day. Less than 6 oz (170 g) of lean meat, poultry, or fish each day. A 3-oz (85-g) serving of meat is about the same size as a deck of cards. One egg equals 1 oz (28 g). 2-3 servings of low-fat dairy each day. One serving is 1 cup (237 mL). 1 serving of nuts, seeds, or beans 5 times each week. 2-3 servings of heart-healthy fats. Healthy fats called  omega-3 fatty acids are found in foods such as walnuts, flaxseeds, fortified milks, and eggs. These fats are also found in cold-water fish, such as sardines, salmon, and mackerel. Limit how much you eat of: Canned or prepackaged foods. Food that is high in trans fat, such as some fried foods. Food that is high in saturated fat, such as fatty meat. Desserts and other sweets, sugary drinks, and other foods with added sugar. Full-fat dairy products. Do not salt foods  before eating. Do not eat more than 4 egg yolks a week. Try to eat at least 2 vegetarian meals a week. Eat more home-cooked food and less restaurant, buffet, and fast food. Lifestyle When eating at a restaurant, ask that your food be prepared with less salt or no salt, if possible. If you drink alcohol: Limit how much you use to: 0-1 drink a day for women who are not pregnant. 0-2 drinks a day for men. Be aware of how much alcohol is in your drink. In the [place] one drink equals one 12 oz bottle of beer (355 mL), one 5 oz glass of wine (148 mL), or one 1 oz glass of hard liquor (44 mL). General information Avoid eating more than 2,300 mg of salt a day. If you have hypertension, you may need to reduce your sodium intake to 1,500 mg a day. Work with your health care provider to maintain a healthy body weight or to lose weight. Ask what an ideal weight is for you. Get at least 30 minutes of exercise that causes your heart to beat faster (aerobic exercise) most days of the week. Activities may include walking, swimming, or biking. Work with your health care provider or dietitian to adjust your eating plan to your individual calorie needs. What foods should I eat? Fruits All fresh, dried, or frozen fruit. Canned fruit in natural juice (without added sugar). Vegetables Fresh or frozen vegetables (raw, steamed, roasted, or grilled). Low-sodium or reduced-sodium tomato and vegetable juice. Low-sodium or reduced-sodium tomato sauce and tomato paste. Low-sodium or reduced-sodium canned vegetables. Grains Whole-grain or whole-wheat bread. Whole-grain or whole-wheat pasta. Brown rice. [place]. Bulgur. Whole-grain and low-sodium cereals. Pita bread. Low-fat, low-sodium crackers. Whole-wheat flour tortillas. Meats and other proteins Skinless chicken or [place]. Ground chicken or [place]. Pork with fat trimmed off. Fish and seafood. Egg whites. Dried beans, peas, or lentils. Unsalted nuts, nut  butters, and seeds. Unsalted canned beans. Lean cuts of beef with fat trimmed off. Low-sodium, lean precooked or cured meat, such as sausages or meat loaves. Dairy Low-fat (1%) or fat-free (skim) milk. Reduced-fat, low-fat, or fat-free cheeses. Nonfat, low-sodium ricotta or cottage cheese. Low-fat or nonfat yogurt. Low-fat, low-sodium cheese. Fats and oils Soft margarine without trans fats. Vegetable oil. Reduced-fat, low-fat, or light mayonnaise and salad dressings (reduced-sodium). Canola, safflower, olive, avocado, soybean, and sunflower oils. Avocado. Seasonings and condiments Herbs. Spices. Seasoning mixes without salt. Other foods Unsalted popcorn and pretzels. Fat-free sweets. The items listed above may not be a complete list of foods and beverages you can eat. Contact a dietitian for more information. What foods should I avoid? Fruits Canned fruit in a light or heavy syrup. Fried fruit. Fruit in cream or butter sauce. Vegetables Creamed or fried vegetables. Vegetables in a cheese sauce. Regular canned vegetables (not low-sodium or reduced-sodium). Regular canned tomato sauce and paste (not low-sodium or reduced-sodium). Regular tomato and vegetable juice (not low-sodium or reduced-sodium). Angie Fava. Olives. Grains Baked goods made with fat, such as croissants, muffins, or some  breads. Dry pasta or rice meal packs. Meats and other proteins Fatty cuts of meat. Ribs. Fried meat. Berniece Salines. Bologna, salami, and other precooked or cured meats, such as sausages or meat loaves. Fat from the back of a pig (fatback). Bratwurst. Salted nuts and seeds. Canned beans with added salt. Canned or smoked fish. Whole eggs or egg yolks. Chicken or [place] with skin. Dairy Whole or 2% milk, cream, and half-and-half. Whole or full-fat cream cheese. Whole-fat or sweetened yogurt. Full-fat cheese. Nondairy creamers. Whipped toppings. Processed cheese and cheese spreads. Fats and oils Butter. Stick margarine. Lard.  Shortening. Ghee. Bacon fat. Tropical oils, such as coconut, palm kernel, or palm oil. Seasonings and condiments Onion salt, garlic salt, seasoned salt, table salt, and sea salt. Worcestershire sauce. Tartar sauce. Barbecue sauce. Teriyaki sauce. Soy sauce, including reduced-sodium. Steak sauce. Canned and packaged gravies. Fish sauce. Oyster sauce. Cocktail sauce. Store-bought horseradish. Ketchup. Mustard. Meat flavorings and tenderizers. Bouillon cubes. Hot sauces. Pre-made or packaged marinades. Pre-made or packaged taco seasonings. Relishes. Regular salad dressings. Other foods Salted popcorn and pretzels. The items listed above may not be a complete list of foods and beverages you should avoid. Contact a dietitian for more information. Where to find more information National Heart, Lung, and Blood Institute: https://wilson-eaton.com/ American Heart Association: www.heart.org Academy of Nutrition and Dietetics: www.eatright.[place]: www.kidney.org Summary The DASH eating plan is a healthy eating plan that has been shown to reduce high blood pressure (hypertension). It may also reduce your risk for type 2 diabetes, heart disease, and stroke. When on the DASH eating plan, aim to eat more fresh fruits and vegetables, whole grains, lean proteins, low-fat dairy, and heart-healthy fats. With the DASH eating plan, you should limit salt (sodium) intake to 2,300 mg a day. If you have hypertension, you may need to reduce your sodium intake to 1,500 mg a day. Work with your health care provider or dietitian to adjust your eating plan to your individual calorie needs. This information is not intended to replace advice given to you by your health care provider. Make sure you discuss any questions you have with your health care provider. Document Revised: 06/29/2019 Document Reviewed: 06/29/2019 Elsevier Patient Education  [place]

## 2022-04-08 ENCOUNTER — Other Ambulatory Visit: Payer: Self-pay | Admitting: Student

## 2022-04-10 ENCOUNTER — Other Ambulatory Visit: Payer: Self-pay | Admitting: Family Medicine

## 2022-04-13 DIAGNOSIS — I10 Essential (primary) hypertension: Secondary | ICD-10-CM | POA: Diagnosis not present

## 2022-04-13 DIAGNOSIS — Z23 Encounter for immunization: Secondary | ICD-10-CM | POA: Diagnosis not present

## 2022-04-13 DIAGNOSIS — R630 Anorexia: Secondary | ICD-10-CM | POA: Diagnosis not present

## 2022-04-13 DIAGNOSIS — R519 Headache, unspecified: Secondary | ICD-10-CM | POA: Diagnosis not present

## 2022-04-22 ENCOUNTER — Encounter: Payer: Self-pay | Admitting: Neurology

## 2022-04-22 ENCOUNTER — Ambulatory Visit (INDEPENDENT_AMBULATORY_CARE_PROVIDER_SITE_OTHER): Payer: PPO | Admitting: Neurology

## 2022-04-22 VITALS — BP 157/73 | HR 65 | Wt 101.0 lb

## 2022-04-22 DIAGNOSIS — G301 Alzheimer's disease with late onset: Secondary | ICD-10-CM

## 2022-04-22 DIAGNOSIS — F02A0 Dementia in other diseases classified elsewhere, mild, without behavioral disturbance, psychotic disturbance, mood disturbance, and anxiety: Secondary | ICD-10-CM

## 2022-04-22 MED ORDER — DONEPEZIL HCL 5 MG PO TBDP
10.0000 mg | ORAL_TABLET | Freq: Every day | ORAL | 11 refills | Status: DC
Start: 2022-04-22 — End: 2023-10-10

## 2022-04-22 MED ORDER — MEMANTINE HCL 10 MG PO TABS: 10.0000 mg | ORAL_TABLET | Freq: Two times a day (BID) | ORAL | 11 refills | Status: DC
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
## 2022-04-22 NOTE — Patient Instructions (Signed)
Increase Aricept to 10 mg nightly, side effects of the medication discussed and found.  Patient Start with Namenda 10 mg twice daily Continue all medication Follow-up in 1 year or sooner if worse

## 2022-05-04 ENCOUNTER — Other Ambulatory Visit: Payer: Self-pay | Admitting: Student

## 2022-05-05 DIAGNOSIS — H401222 Low-tension glaucoma, left eye, moderate stage: Secondary | ICD-10-CM | POA: Diagnosis not present

## 2022-05-05 DIAGNOSIS — Z970 Presence of artificial eye: Secondary | ICD-10-CM | POA: Diagnosis not present

## 2022-05-05 DIAGNOSIS — H16222 Keratoconjunctivitis sicca, not specified as Sjogren's, left eye: Secondary | ICD-10-CM | POA: Diagnosis not present

## 2022-05-05 DIAGNOSIS — H02422 Myogenic ptosis of left eyelid: Secondary | ICD-10-CM | POA: Diagnosis not present

## 2022-05-05 DIAGNOSIS — Z961 Presence of intraocular lens: Secondary | ICD-10-CM | POA: Diagnosis not present

## 2022-05-05 DIAGNOSIS — H43812 Vitreous degeneration, left eye: Secondary | ICD-10-CM | POA: Diagnosis not present

## 2022-05-06 ENCOUNTER — Telehealth: Payer: Self-pay | Admitting: Gastroenterology

## 2022-05-06 NOTE — Telephone Encounter (Signed)
The pt's daughter states she would like the pt seen to discuss other options for colon eval.  The pt was not able to proceed with colon due to nausea with prepping.  An appt was made for 10/30 with Estill Bamberg to discuss.

## 2022-05-06 NOTE — Telephone Encounter (Signed)
Patients daughter called asking if there is any other way to have a procedure because the last time she was not able to tolerate the prep medication. Please advise.

## 2022-05-19 ENCOUNTER — Other Ambulatory Visit: Payer: Self-pay | Admitting: Neurology

## 2022-06-04 NOTE — Progress Notes (Signed)
06/07/2022 Meredith Owens [id number] [birth year]  Referring provider: Azzie Glatter, FNP Primary GI doctor: Dr. Tarri Glenn  (Dr. Ardis Hughs)  ASSESSMENT AND PLAN:  Unintentional weight loss Unremarkable labs, normal thyroid, no IDA.  Was set up for colon/EGd but could not tolerate the prep and they do not want to proceed with colonoscopy.  We will proceed with EGD and get CT Ab and pelvis, patient and daughter understand that this is not as good as a colonoscopy but I think it is good substitute at this time.  Given information about food to eat for weight gain Possible from lack of teeth/broken teeth/depression.   Hx of colonic polyps No IDA, colon 2017 1 SS Unable to tolerate prep, declines trying again at this time. Will proceed with CT AB and pelvis with contrast to evaluate further, can consider in the future pending results. .  Nausea without vomiting With weight loss Will proceed with CT AB and pelvis and EGD Can consider GES     History of Present Illness:  81 y.o. female  with a past medical history of hypertension, hyperlipidemia, prediabetes, history of adenomatous polyps and others listed below, returns to clinic today for evaluation of weight loss.  2014 colonoscopy due to history of precancerous colon polyps Dr. Ardis Hughs 5 subcentimeter adenomatous polyp. 04/2016 colonoscopy with 2 subcentimeter polyps removed 1 hyperplastic the other sessile serrated left-sided diverticulosis. 11/04/2021 office visit with Dr. Ardis Hughs to discuss colonoscopy, was complaining of mild chronic constipation and unintentional weight loss.   Scheduled for EGD and colonoscopy due to unintentional weight loss but she could not tolerate the prep.   She drank half of the miralax and could not drink anymore. Daughter is here with her and gives her some of the history. States she was not eating well prior, and she was disoriented for a day. She was living by herself at that time but since then has  been living with her daughter.   She is on miralax for constipation, when she takes it will help. She is not eating well.  She has gets full quickly, has some nausea, no vomit.  She does not like milk so will not drink ensure/boost. She has dental issues right now, she is not chewing or eating well.  Some dark stool occ, but no hematochezia.  They are willing to proceed with the endoscopy but does not want the colonoscopy at this time.  With recent labs she had normal thyroid and normal iron.    Wt Readings from Last 10 Encounters:  06/07/22 99 lb 6 oz (45.1 kg)  04/22/22 101 lb (45.8 kg)  03/19/22 103 lb 6.4 oz (46.9 kg)  01/11/22 109 lb 4 oz (49.6 kg)  12/10/21 111 lb 6 oz (50.5 kg)  11/04/21 112 lb (50.8 kg)  09/29/21 115 lb 6.4 oz (52.3 kg)  08/24/21 113 lb 9.6 oz (51.5 kg)  07/20/21 119 lb (54 kg)  07/17/21 119 lb 12.8 oz (54.3 kg)     She  reports that she has never smoked. She has never used smokeless tobacco. She reports current alcohol use. She reports that she does not use drugs. Her family history includes Cancer in her mother; Diabetes in her daughter, son, and son.   Current Medications:    Current Outpatient Medications (Cardiovascular):    carvedilol (COREG) 12.5 MG tablet, TAKE 1 TABLET BY MOUTH TWICE A DAY   hydrALAZINE (APRESOLINE) 10 MG tablet, Take 1 tablet (10 mg total) by mouth 3 (three) times  daily.   rosuvastatin (CRESTOR) 10 MG tablet, Take 1 tablet (10 mg total) by mouth every other day.   valsartan-hydrochlorothiazide (DIOVAN HCT) 80-12.5 MG tablet, Take 1 tablet by mouth daily.   Current Outpatient Medications (Analgesics):    acetaminophen (TYLENOL 8 HOUR) 650 MG CR tablet, Take 1 tablet (650 mg total) by mouth every 8 (eight) hours as needed for pain.   aspirin 81 MG tablet, Take 81 mg by mouth daily.   Current Outpatient Medications (Other):    diclofenac sodium (VOLTAREN) 1 % GEL, Apply 4 g topically 4 (four) times daily.   donepezil  (ARICEPT ODT) 5 MG disintegrating tablet, Take 2 tablets (10 mg total) by mouth at bedtime.   dorzolamide-timolol (COSOPT) 22.3-6.8 MG/ML ophthalmic solution, 1 drop 2 (two) times daily.   memantine (NAMENDA) 10 MG tablet, TAKE 1 TABLET BY MOUTH TWICE A DAY   RESTASIS 0.05 % ophthalmic emulsion,    ROCKLATAN 0.02-0.005 % SOLN, INSTILL 1 DROP INTO LEFT EYE AT BEDTIME  Surgical History:  She  has a past surgical history that includes Left eye surgery for cataracts (2010); Hysterectomy and removal of 1 ovary; Right eye removed (1960s); Carpal tunnel surgery (left hand); Abdominal hysterectomy; Bladder surgery; and Colon surgery.  Current Medications, Allergies, Past Medical History, Past Surgical History, Family History and Social History were reviewed in [employer] record.  Physical Exam: BP 122/64   Pulse 69   Ht 5' (1.524 m)   Wt 99 lb 6 oz (45.1 kg)   BMI 19.41 kg/m  General:  Thin appearing female in no acute distress Heart : Regular rate and rhythm; no murmurs Pulm: Clear anteriorly; no wheezing Abdomen:  Soft, Flat AB, Sluggish bowel sounds. No tenderness . , No organomegaly appreciated. Rectal: Not evaluated Extremities:  without  edema. Neurologic:  Alert and  oriented x4;  No focal deficits.  Psych:  Cooperative. Normal mood and affect.   Vladimir Crofts, PA-C 06/07/22

## 2022-06-07 ENCOUNTER — Other Ambulatory Visit (INDEPENDENT_AMBULATORY_CARE_PROVIDER_SITE_OTHER): Payer: PPO

## 2022-06-07 ENCOUNTER — Ambulatory Visit: Payer: PPO | Admitting: Physician Assistant

## 2022-06-07 ENCOUNTER — Encounter: Payer: Self-pay | Admitting: Physician Assistant

## 2022-06-07 VITALS — BP 122/64 | HR 69 | Ht 60.0 in | Wt 99.4 lb

## 2022-06-07 DIAGNOSIS — R634 Abnormal weight loss: Secondary | ICD-10-CM | POA: Diagnosis not present

## 2022-06-07 DIAGNOSIS — R112 Nausea with vomiting, unspecified: Secondary | ICD-10-CM

## 2022-06-07 DIAGNOSIS — R110 Nausea: Secondary | ICD-10-CM | POA: Diagnosis not present

## 2022-06-07 DIAGNOSIS — Z86010 Personal history of colonic polyps: Secondary | ICD-10-CM | POA: Diagnosis not present

## 2022-06-07 LAB — COMPREHENSIVE METABOLIC PANEL
ALT: 8 U/L (ref 0–35)
AST: 15 U/L (ref 0–37)
Albumin: 4.2 g/dL (ref 3.5–5.2)
Alkaline Phosphatase: 46 U/L (ref 39–117)
BUN: 21 mg/dL (ref 6–23)
CO2: 31 mEq/L (ref 19–32)
Calcium: 10.2 mg/dL (ref 8.4–10.5)
Chloride: 106 mEq/L (ref 96–112)
Creatinine, Ser: 1.22 mg/dL — ABNORMAL HIGH (ref 0.40–1.20)
GFR: 41.87 mL/min — ABNORMAL LOW (ref >60.00)
Glucose, Bld: 83 mg/dL (ref 70–99)
Potassium: 4 mEq/L (ref 3.5–5.1)
Sodium: 142 mEq/L (ref 135–145)
Total Bilirubin: 0.4 mg/dL (ref 0.2–1.2)
Total Protein: 7 g/dL (ref 6.0–8.3)

## 2022-06-07 NOTE — Patient Instructions (Signed)
You can add on protein and things high calorie for weight gain - Can add ensure/boost to ice cream for a shake - can add protein powder to oatmeal after cooked or to a fruit smooth - avocado has high calorie, good to add to proteins - nuts and peanut butter are good to eat or add to yogurt/smoothies - Greek yogurt is good  You have been scheduled for a CT scan of the abdomen and pelvis at [facility] ([address], [place], [place] [address]).   You are scheduled on 06-17-2022 at 230pm. You need to arrive by 12pm to drink contrast at 1230pm and another bottle 130pm  minutes prior to your appointment time for registration. Please follow the written instructions below on the day of your exam:  WARNING: IF YOU ARE ALLERGIC TO IODINE/X-RAY DYE, PLEASE NOTIFY RADIOLOGY IMMEDIATELY AT [phone]! YOU WILL BE GIVEN A 13 HOUR PREMEDICATION PREP.  1) Do not eat or drink anything after 1030am (4 hours prior to your test)   You may take any medications as prescribed with a small amount of water, if necessary. If you take any of the following medications: METFORMIN, GLUCOPHAGE, GLUCOVANCE, AVANDAMET, RIOMET, FORTAMET, ACTOPLUS MET, JANUMET, GLUMETZA or METAGLIP, you MAY be asked to HOLD this medication 48 hours AFTER the exam.  The purpose of you drinking the oral contrast is to aid in the visualization of your intestinal tract. The contrast solution may cause some diarrhea. Depending on your individual set of symptoms, you may also receive an intravenous injection of x-ray contrast/dye. Plan on being at [facility] for 30 minutes or longer, depending on the type of exam you are having performed.  This test typically takes 30-45 minutes to complete.  If you have any questions regarding your exam or if you need to reschedule, you may call the CT department at [phone] between the hours of 8:00 am and 5:00 pm,  Monday-Friday.  ________________________________________________________________________  You have been scheduled for an endoscopy. Please follow written instructions given to you at your visit today. If you use inhalers (even only as needed), please bring them with you on the day of your procedure.  It was a pleasure to see you today!  Thank you for trusting me with your gastrointestinal care!     

## 2022-06-16 ENCOUNTER — Ambulatory Visit (INDEPENDENT_AMBULATORY_CARE_PROVIDER_SITE_OTHER): Payer: PPO | Admitting: Podiatry

## 2022-06-16 DIAGNOSIS — M79675 Pain in left toe(s): Secondary | ICD-10-CM | POA: Diagnosis not present

## 2022-06-16 DIAGNOSIS — M79674 Pain in right toe(s): Secondary | ICD-10-CM

## 2022-06-16 DIAGNOSIS — B351 Tinea unguium: Secondary | ICD-10-CM | POA: Diagnosis not present

## 2022-06-17 ENCOUNTER — Ambulatory Visit (HOSPITAL_COMMUNITY)
Admission: RE | Admit: 2022-06-17 | Discharge: 2022-06-17 | Disposition: A | Discharge status: Home / Self Care | Payer: PPO | Source: Ambulatory Visit | Attending: Physician Assistant | Admitting: Physician Assistant

## 2022-06-17 DIAGNOSIS — R634 Abnormal weight loss: Secondary | ICD-10-CM | POA: Insufficient documentation

## 2022-06-17 DIAGNOSIS — R1110 Vomiting, unspecified: Secondary | ICD-10-CM | POA: Diagnosis not present

## 2022-06-17 DIAGNOSIS — R112 Nausea with vomiting, unspecified: Secondary | ICD-10-CM | POA: Insufficient documentation

## 2022-06-17 DIAGNOSIS — R110 Nausea: Secondary | ICD-10-CM | POA: Diagnosis not present

## 2022-06-17 MED ORDER — IOHEXOL 300 MG/ML  SOLN
100.0000 mL | Freq: Once | INTRAMUSCULAR | Status: AC | PRN
  Administered 2022-06-17: 100 mL via INTRAVENOUS

## 2022-06-21 ENCOUNTER — Encounter: Payer: Self-pay | Admitting: Podiatry

## 2022-06-21 ENCOUNTER — Telehealth: Payer: Self-pay | Admitting: Gastroenterology

## 2022-06-21 NOTE — Telephone Encounter (Signed)
Left message for patient's daughter to call back. 

## 2022-06-21 NOTE — Telephone Encounter (Signed)
Patient's daughter [name] called states patient currently has temporary teeth bridge in her mouth and wondering if patient can still proceed with procedure on 11-16. Requesting a call back to advise.

## 2022-06-21 NOTE — Progress Notes (Signed)
  Subjective:  Patient ID: Meredith Owens, female    DOB: 1940/09/01,  MRN: [id number]  Meredith Owens presents to clinic today for painful thick toenails that are difficult to trim. Pain interferes with ambulation. Aggravating factors include wearing enclosed shoe gear. Pain is relieved with periodic professional debridement.  Chief Complaint  Patient presents with   Nail Problem    Nail Trim  Not Diabetic  PCP- Kathe Becton    New problem(s): None.   PCP is Azzie Glatter, FNP , and last visit was April 13, 2022.  Allergies  Allergen Reactions   Brimonidine Other (See Comments)    Red eyes   Oxycodone Hcl     REACTION: Hives   Prochlorperazine Edisylate     REACTION: anaphylaxis   Latex Itching and Rash    Review of Systems: Negative except as noted in the HPI.  Objective: No changes noted in today's physical examination.  Meredith Owens is a pleasant 81 y.o. female thin build in NAD. AAO x 3.  Vascular Examination:  CFT <3 seconds b/l LE. Palpable DP pulse(s) b/l LE. Palpable PT pulse(s) b/l LE. Pedal hair absent. No pain with calf compression b/l. Lower extremity skin temperature gradient within normal limits. No edema noted b/l LE. No cyanosis or clubbing noted b/l LE.  Dermatological Examination: Pedal integument with normal turgor, texture and tone BLE. No open wounds b/l LE. No interdigital macerations noted b/l LE. Toenails 1-5 b/l elongated, discolored, dystrophic, thickened, crumbly with subungual debris and tenderness to dorsal palpation.  Musculoskeletal: Muscle strength 5/5 to all lower extremity muscle groups bilaterally. No pain, crepitus or joint limitation noted with ROM bilateral LE. HAV with bunion deformity noted b/l LE.  Neurological: Protective sensation intact 5/5 intact bilaterally with 10g monofilament b/l. Vibratory sensation intact b/l.  Assessment/Plan: 1. Pain due to onychomycosis of toenails of both feet     No orders of the  defined types were placed in this encounter.   -Patient's family member present. All questions/concerns addressed on today's visit. -Continue diabetic foot care principles: inspect feet daily, monitor glucose as recommended by PCP and/or Endocrinologist, and follow prescribed diet per PCP, Endocrinologist and/or dietician. -Continue supportive shoe gear daily. -Toenails 1-5 b/l were debrided in length and girth with sterile nail nippers and dremel without iatrogenic bleeding.  -Patient/POA to call should there be question/concern in the interim.   Return in about 3 months (around 09/16/2022).  Marzetta Board, DPM

## 2022-06-22 NOTE — Telephone Encounter (Signed)
Spoke with patient's daughter & she would prefer to reschedule given her mom's recent dental work. Procedure rescheduled for 07/05/22 at 10:00 am. Updated instructions sent to mychart.

## 2022-06-24 ENCOUNTER — Encounter: Payer: PPO | Admitting: Gastroenterology

## 2022-06-26 ENCOUNTER — Other Ambulatory Visit: Payer: Self-pay | Admitting: Family Medicine

## 2022-07-05 ENCOUNTER — Telehealth: Payer: Self-pay

## 2022-07-05 ENCOUNTER — Ambulatory Visit (AMBULATORY_SURGERY_CENTER): Payer: PPO | Admitting: Gastroenterology

## 2022-07-05 ENCOUNTER — Encounter: Payer: Self-pay | Admitting: Gastroenterology

## 2022-07-05 VITALS — BP 145/64 | HR 55 | Temp 98.6°F | Resp 13 | Ht 60.0 in | Wt 99.0 lb

## 2022-07-05 DIAGNOSIS — R634 Abnormal weight loss: Secondary | ICD-10-CM | POA: Diagnosis not present

## 2022-07-05 DIAGNOSIS — K259 Gastric ulcer, unspecified as acute or chronic, without hemorrhage or perforation: Secondary | ICD-10-CM | POA: Diagnosis not present

## 2022-07-05 DIAGNOSIS — R110 Nausea: Secondary | ICD-10-CM

## 2022-07-05 DIAGNOSIS — K2950 Unspecified chronic gastritis without bleeding: Secondary | ICD-10-CM | POA: Diagnosis not present

## 2022-07-05 DIAGNOSIS — K2980 Duodenitis without bleeding: Secondary | ICD-10-CM | POA: Diagnosis not present

## 2022-07-05 DIAGNOSIS — E785 Hyperlipidemia, unspecified: Secondary | ICD-10-CM | POA: Diagnosis not present

## 2022-07-05 DIAGNOSIS — I10 Essential (primary) hypertension: Secondary | ICD-10-CM | POA: Diagnosis not present

## 2022-07-05 DIAGNOSIS — B9681 Helicobacter pylori [H. pylori] as the cause of diseases classified elsewhere: Secondary | ICD-10-CM | POA: Diagnosis not present

## 2022-07-05 DIAGNOSIS — R7303 Prediabetes: Secondary | ICD-10-CM | POA: Diagnosis not present

## 2022-07-05 MED ORDER — PANTOPRAZOLE SODIUM 40 MG PO TBEC: 40.0000 mg | DELAYED_RELEASE_TABLET | Freq: Two times a day (BID) | ORAL | 3 refills | Status: DC

## 2022-07-05 MED ORDER — SODIUM CHLORIDE 0.9 % IV SOLN: 500.0000 mL | Freq: Once | INTRAVENOUS | Status: AC

## 2022-07-05 NOTE — Progress Notes (Signed)
A and O x3. Report to RN. Tolerated MAC anesthesia well.Teeth unchanged after procedure. 

## 2022-07-05 NOTE — Progress Notes (Signed)
Indication for upper endoscopy: Unintentional weight loss  Please see the 06/07/2022 office note for complete details.  There is been no significant change in history or physical exam since that time.  The patient remains an appropriate candidate for monitored anesthesia care in the endoscopy center today.

## 2022-07-05 NOTE — Telephone Encounter (Signed)
-----   Message from Thornton Park, MD sent at 07/05/2022 10:03 AM EST ----- Office follow-up with Estill Bamberg in 4 weeks. Repeat EGD in 8-10 weeks.  Thanks.  KLB

## 2022-07-05 NOTE — Progress Notes (Signed)
Pt's states no medical or surgical changes since previsit or office visit. VS assessed by C.W 

## 2022-07-05 NOTE — Telephone Encounter (Signed)
OV scheduled for 08/18/22 at 10:00 am with Estill Bamberg, [place]. EGD previously scheduled already in [place]. Pt notified via mychart.

## 2022-07-05 NOTE — Op Note (Signed)
[name]: Meredith Owens Procedure Date: 07/05/2022 9:48 AM MRN: [id number] Endoscopist: Thornton Park MD, MD, [id number] Age: 81 Referring MD:  Date of Birth: 20-Oct-1940 Gender: Female Account #: [id number] Procedure:                Upper GI endoscopy Indications:              Anorexia, Weight loss - not explained by recent CT                            abd/pelvis Medicines:                Monitored Anesthesia Care Procedure:                Pre-Anesthesia Assessment:                           - Prior to the procedure, a History and Physical                            was performed, and patient medications and                            allergies were reviewed. The patient's tolerance of                            previous anesthesia was also reviewed. The risks                            and benefits of the procedure and the sedation                            options and risks were discussed with the patient.                            All questions were answered, and informed consent                            was obtained. Prior Anticoagulants: The patient has                            taken no anticoagulant or antiplatelet agents. ASA                            Grade Assessment: II - A patient with mild systemic                            disease. After reviewing the risks and benefits,                            the patient was deemed in satisfactory condition to                            undergo the procedure.  After obtaining informed consent, the endoscope was                            passed under direct vision. Throughout the                            procedure, the patient's blood pressure, pulse, and                            oxygen saturations were monitored continuously. The                            GIF [id number] #[id number] was introduced through the                            mouth, and advanced to the third part of  duodenum.                            The upper GI endoscopy was accomplished without                            difficulty. The patient tolerated the procedure                            well. Scope In: Scope Out: Findings:                 The examined esophagus was normal. The z-line is                            located 38 cm from the incisors.                           Many non-bleeding cratered gastric ulcers and                            erosions with no stigmata of bleeding were found in                            the gastric body and in the gastric antrum. The                            largest lesion was 5 mm in largest dimension.                            Biopsies were taken from the antrum, body, and                            fundus with a cold forceps for histology. Estimated                            blood loss was minimal.  Patchy moderately erythematous mucosa and                            ulceration without active bleeding and with no                            stigmata of bleeding was found in the duodenal                            bulb. Biopsies were taken with a cold forceps for                            histology. Estimated blood loss was minimal.                           The cardia and gastric fundus were normal on                            retroflexion. Complications:            No immediate complications. Estimated Blood Loss:     Estimated blood loss was minimal. Impression:               - Normal esophagus.                           - Non-bleeding gastric ulcers with no stigmata of                            bleeding. Biopsied.                           - Erythematous duodenopathy. Biopsied. Recommendation:           - Patient has a contact number available for                            emergencies. The signs and symptoms of potential                            delayed complications were discussed with the                             patient. Return to normal activities tomorrow.                            Written discharge instructions were provided to the                            patient.                           - Resume previous diet.                           - Continue present medications.                           -  Start pantoprazole 40 mg twice daily for at least                            10 weeks.                           - Repeat EGD in 10 weeks.                           - No aspirin, ibuprofen, naproxen, or other                            non-steroidal anti-inflammatory drugs.                           - Await pathology results. Thornton Park MD, MD 07/05/2022 10:10:46 AM This report has been signed electronically.

## 2022-07-05 NOTE — Progress Notes (Signed)
Called to room to assist during endoscopic procedure.  Patient ID and intended procedure confirmed with present staff. Received instructions for my participation in the procedure from the performing physician.  

## 2022-07-05 NOTE — Patient Instructions (Addendum)
RECOMMENDATIONS: Patient has a contact number available for emergencies. The signs and symptoms of potential delayed complications were discussed with the patient. Return to normal activities tomorrow. Written discharge instructions were provided to the patient. - Resume previous diet. - Continue present medications. - Start pantoprazole 40 mg twice daily for at least 10 weeks. - Repeat EGD in 10 weeks. - No aspirin, ibuprofen, naproxen, or other non-steroidal anti-inflammatory drugs. - Await pathology results.   YOU HAD AN ENDOSCOPIC PROCEDURE TODAY AT [facility]:   Refer to the procedure report that was given to you for any specific questions about what was found during the examination.  If the procedure report does not answer your questions, please call your gastroenterologist to clarify.  If you requested that your care partner not be given the details of your procedure findings, then the procedure report has been included in a sealed envelope for you to review at your convenience later.  YOU SHOULD EXPECT: Some feelings of bloating in the abdomen. Passage of more gas than usual.  Walking can help get rid of the air that was put into your GI tract during the procedure and reduce the bloating. If you had a lower endoscopy (such as a colonoscopy or flexible sigmoidoscopy) you may notice spotting of blood in your stool or on the toilet paper. If you underwent a bowel prep for your procedure, you may not have a normal bowel movement for a few days.  Please Note:  You might notice some irritation and congestion in your nose or some drainage.  This is from the oxygen used during your procedure.  There is no need for concern and it should clear up in a day or so.  SYMPTOMS TO REPORT IMMEDIATELY:  Following upper endoscopy (EGD)  Vomiting of blood or coffee ground material  New chest pain or pain under the shoulder blades  Painful or persistently difficult swallowing  New  shortness of breath  Fever of 100F or higher  Black, tarry-looking stools  For urgent or emergent issues, a gastroenterologist can be reached at any hour by calling [phone]. Do not use MyChart messaging for urgent concerns.    DIET:  We do recommend a small meal at first, but then you may proceed to your regular diet.  Drink plenty of fluids but you should avoid alcoholic beverages for 24 hours.  MEDICATIONS: Continue present medications. Start Pantoprazole 40 mg twice daily for at least 10 weeks. NO ASPIRIN, IBUPROFEN, NAPROXEN, OR OTHER NON-STEROIDAL ANTI-INFLAMMATORY DRUGS.  FOLLOW UP: Await pathology results. Repeat EGD in 10 weeks scheduled for February 6 at 10:00am (arrive at 09:00am).  Thank you for allowing [place] to provide for your healthcare needs today.   ACTIVITY:  You should plan to take it easy for the rest of today and you should NOT DRIVE or use heavy machinery until tomorrow (because of the sedation medicines used during the test).    FOLLOW UP: Our staff will call the number listed on your records the next business day following your procedure.  We will call around 7:15- 8:00 am to check on you and address any questions or concerns that you may have regarding the information given to you following your procedure. If we do not reach you, we will leave a message.     If any biopsies were taken you will be contacted by phone or by letter within the next 1-3 weeks.  Please call us at [phone] if you have not heard  about the biopsies in 3 weeks.    SIGNATURES/CONFIDENTIALITY: You and/or your care partner have signed paperwork which will be entered into your electronic medical record.  These signatures attest to the fact that that the information above on your After Visit Summary has been reviewed and is understood.  Full responsibility of the confidentiality of this discharge information lies with you and/or your care-partner.

## 2022-07-05 NOTE — Progress Notes (Signed)
.  klbex

## 2022-07-06 ENCOUNTER — Telehealth: Payer: Self-pay | Admitting: *Deleted

## 2022-07-06 NOTE — Telephone Encounter (Signed)
  Follow up Call-     07/05/2022    9:08 AM  Call back number  Post procedure Call Back phone  # [phone]  Permission to leave phone message Yes     Patient questions:  Do you have a fever, pain , or abdominal swelling? No. Pain Score  0 *  Have you tolerated food without any problems? Yes.    Have you been able to return to your normal activities? Yes.    Do you have any questions about your discharge instructions: Diet   No. Medications  No. Follow up visit  No.  Do you have questions or concerns about your Care? No.  Actions: * If pain score is 4 or above: No action needed, pain <4.

## 2022-07-09 ENCOUNTER — Other Ambulatory Visit: Payer: Self-pay | Admitting: Gastroenterology

## 2022-07-09 ENCOUNTER — Other Ambulatory Visit: Payer: Self-pay

## 2022-07-09 DIAGNOSIS — B9681 Helicobacter pylori [H. pylori] as the cause of diseases classified elsewhere: Secondary | ICD-10-CM

## 2022-07-09 MED ORDER — BISMUTH/METRONIDAZ/TETRACYCLIN 140-125-125 MG PO CAPS: 3.0000 | ORAL_CAPSULE | Freq: Three times a day (TID) | ORAL | 0 refills | Status: DC

## 2022-07-13 ENCOUNTER — Other Ambulatory Visit (HOSPITAL_COMMUNITY): Payer: Self-pay

## 2022-07-13 ENCOUNTER — Telehealth: Payer: Self-pay | Admitting: Pharmacy Technician

## 2022-07-13 DIAGNOSIS — B9681 Helicobacter pylori [H. pylori] as the cause of diseases classified elsewhere: Secondary | ICD-10-CM | POA: Diagnosis not present

## 2022-07-13 DIAGNOSIS — R519 Headache, unspecified: Secondary | ICD-10-CM | POA: Diagnosis not present

## 2022-07-13 DIAGNOSIS — Z0000 Encounter for general adult medical examination without abnormal findings: Secondary | ICD-10-CM | POA: Diagnosis not present

## 2022-07-13 DIAGNOSIS — R630 Anorexia: Secondary | ICD-10-CM | POA: Diagnosis not present

## 2022-07-13 DIAGNOSIS — K253 Acute gastric ulcer without hemorrhage or perforation: Secondary | ICD-10-CM | POA: Diagnosis not present

## 2022-07-13 DIAGNOSIS — K5909 Other constipation: Secondary | ICD-10-CM | POA: Diagnosis not present

## 2022-07-13 DIAGNOSIS — F03A0 Unspecified dementia, mild, without behavioral disturbance, psychotic disturbance, mood disturbance, and anxiety: Secondary | ICD-10-CM | POA: Diagnosis not present

## 2022-07-13 DIAGNOSIS — I10 Essential (primary) hypertension: Secondary | ICD-10-CM | POA: Diagnosis not present

## 2022-07-13 NOTE — Telephone Encounter (Signed)
PA has been submitted, and telephone encounter has been created. 

## 2022-07-13 NOTE — Telephone Encounter (Signed)
PA in process

## 2022-07-13 NOTE — Telephone Encounter (Signed)
Patient Advocate Encounter  Received notification from [place] that prior authorization for BISMUTH/METRO/TETRA is required.   PA submitted on [id number] [place]  [facility] Status is pending    Luciano Cutter, CPhT Patient Advocate Phone: [phone]

## 2022-07-14 ENCOUNTER — Other Ambulatory Visit (HOSPITAL_COMMUNITY): Payer: Self-pay

## 2022-07-14 NOTE — Telephone Encounter (Signed)
Patient Advocate Encounter  Prior Authorization for BISMUTH/METRO/TETRA 140-125-'125MG'$   has been approved.    PA# [id number] Effective dates: 12.05.2023 through 12.31.2023  Naomi Fitton B. CPhT P: [phone] F: [phone]

## 2022-07-19 ENCOUNTER — Encounter (HOSPITAL_COMMUNITY): Payer: Self-pay

## 2022-07-19 ENCOUNTER — Other Ambulatory Visit: Payer: Self-pay

## 2022-07-19 ENCOUNTER — Emergency Department (HOSPITAL_BASED_OUTPATIENT_CLINIC_OR_DEPARTMENT_OTHER): Payer: PPO | Admitting: Radiology

## 2022-07-19 ENCOUNTER — Emergency Department (HOSPITAL_BASED_OUTPATIENT_CLINIC_OR_DEPARTMENT_OTHER): Payer: PPO

## 2022-07-19 ENCOUNTER — Encounter (HOSPITAL_BASED_OUTPATIENT_CLINIC_OR_DEPARTMENT_OTHER): Payer: Self-pay | Admitting: Emergency Medicine

## 2022-07-19 ENCOUNTER — Ambulatory Visit (HOSPITAL_COMMUNITY)
Admission: EM | Admit: 2022-07-19 | Discharge: 2022-07-19 | Disposition: A | Discharge status: Home / Self Care | Payer: PPO

## 2022-07-19 ENCOUNTER — Emergency Department (HOSPITAL_BASED_OUTPATIENT_CLINIC_OR_DEPARTMENT_OTHER)
Admission: EM | Admit: 2022-07-19 | Discharge: 2022-07-19 | Disposition: A | Discharge status: Home / Self Care | Payer: PPO | Attending: Emergency Medicine | Admitting: Emergency Medicine

## 2022-07-19 DIAGNOSIS — R42 Dizziness and giddiness: Secondary | ICD-10-CM | POA: Insufficient documentation

## 2022-07-19 DIAGNOSIS — I10 Essential (primary) hypertension: Secondary | ICD-10-CM | POA: Diagnosis not present

## 2022-07-19 DIAGNOSIS — Z91040 Latex allergy status: Secondary | ICD-10-CM | POA: Diagnosis not present

## 2022-07-19 DIAGNOSIS — G309 Alzheimer's disease, unspecified: Secondary | ICD-10-CM | POA: Insufficient documentation

## 2022-07-19 DIAGNOSIS — Z79899 Other long term (current) drug therapy: Secondary | ICD-10-CM | POA: Diagnosis not present

## 2022-07-19 DIAGNOSIS — R079 Chest pain, unspecified: Secondary | ICD-10-CM | POA: Diagnosis not present

## 2022-07-19 DIAGNOSIS — Z7982 Long term (current) use of aspirin: Secondary | ICD-10-CM | POA: Diagnosis not present

## 2022-07-19 LAB — BASIC METABOLIC PANEL
Anion gap: 9 (ref 5–15)
BUN: 16 mg/dL (ref 8–23)
CO2: 27 mmol/L (ref 22–32)
Calcium: 9.7 mg/dL (ref 8.9–10.3)
Chloride: 99 mmol/L (ref 98–111)
Creatinine, Ser: 1.35 mg/dL — ABNORMAL HIGH (ref 0.44–1.00)
GFR, Estimated: 40 mL/min — ABNORMAL LOW (ref >60)
Glucose, Bld: 112 mg/dL — ABNORMAL HIGH (ref 70–99)
Potassium: 3.5 mmol/L (ref 3.5–5.1)
Sodium: 135 mmol/L (ref 135–145)

## 2022-07-19 LAB — CBC
HCT: 30.8 % — ABNORMAL LOW (ref 36.0–46.0)
Hemoglobin: 10.2 g/dL — ABNORMAL LOW (ref 12.0–15.0)
MCH: 31.8 pg (ref 26.0–34.0)
MCHC: 33.1 g/dL (ref 30.0–36.0)
MCV: 96 fL (ref 80.0–100.0)
Platelets: 193 10*3/uL (ref 150–400)
RBC: 3.21 MIL/uL — ABNORMAL LOW (ref 3.87–5.11)
RDW: 14.3 % (ref 11.5–15.5)
WBC: 4.7 10*3/uL (ref 4.0–10.5)
nRBC: 0 % (ref 0.0–0.2)

## 2022-07-19 LAB — TROPONIN I (HIGH SENSITIVITY)
Troponin I (High Sensitivity): 8 ng/L (ref <18)
Troponin I (High Sensitivity): 8 ng/L (ref <18)

## 2022-07-19 LAB — MAGNESIUM: Magnesium: 1.9 mg/dL (ref 1.7–2.4)

## 2022-07-19 MED ORDER — MECLIZINE HCL 25 MG PO TABS
25.0000 mg | ORAL_TABLET | Freq: Once | ORAL | Status: AC
  Administered 2022-07-19: 25 mg via ORAL
  Filled 2022-07-19: qty 1

## 2022-07-19 NOTE — ED Provider Notes (Signed)
Patient seen briefly in triage.  She arrives with family members saying that she is lightheaded and dizzy, starting today.  It has been bad enough that she is requiring assistance with someone holding her hand to steady her while she walks.  She has not had any vomiting or diarrhea or fever or chills.  Vital signs were acceptable.  I have asked her and her family to proceed to the emergency room for higher level of care and evaluation then we can provide in the urgent care setting   Barrett Henle, MD 07/19/22 1659

## 2022-07-19 NOTE — ED Provider Notes (Signed)
[facility] EMERGENCY DEPT Provider Note   CSN: [id number] Arrival date & time: 07/19/22  1549     History No chief complaint on file.   Meredith Owens is a 81 y.o. female with medical history of Alzheimer disease, chronic fatigue, arthritis, depression, glaucoma, hyperlipidemia, hypertension.  Patient presents to ED for evaluation of dizziness.  Patient reports that she recently was placed on she recently was placed on triple antibiotic therapy for H. pylori.  Patient reports that she was placed on omeprazole, amoxicillin, clarithromycin and bismuth.  Patient reports that today around 1 PM she took her first dose of amoxicillin, clarithromycin and omeprazole.  Patient reports that 5 minutes after taking his medication she became "unsteady on her feet".  Patient asked if she became dizzy, she denies this stating that it just "feels like something aint right in my head".  Patient reports this feeling of unsteadiness on her feet has happened to her in the past, this is confirmed by her daughter at the bedside.  The patient reports that she was originally seen in urgent care however redirected to ED for higher level of care.  Patient denies any one-sided weakness or numbness, nausea, vomiting, headache, neck pain, blurred vision, chest pain, shortness of breath.  HPI     Home Medications Prior to Admission medications   Medication Sig Start Date End Date Taking? Authorizing Provider  acetaminophen (TYLENOL 8 HOUR) 650 MG CR tablet Take 1 tablet (650 mg total) by mouth every 8 (eight) hours as needed for pain. 06/30/18   Bonnita Hollow, MD  aspirin 81 MG tablet Take 81 mg by mouth daily. 01/17/12   Carolin Guernsey, MD  Bismuth/Metronidaz/Tetracyclin [phone] MG CAPS TAKE 3 CAPSULES BY MOUTH 4 (FOUR) TIMES DAILY - BEFORE MEALS AND AT BEDTIME FOR 10 DAYS. 07/13/22 07/23/22  Thornton Park, MD  carvedilol (COREG) 12.5 MG tablet TAKE 1 TABLET BY MOUTH TWICE A DAY 10/12/21    Gladys Damme, MD  diclofenac sodium (VOLTAREN) 1 % GEL Apply 4 g topically 4 (four) times daily. 05/01/19   Shirley, [place], DO  donepezil (ARICEPT ODT) 5 MG disintegrating tablet Take 2 tablets (10 mg total) by mouth at bedtime. 04/22/22 04/17/23  Alric Ran, MD  dorzolamide-timolol (COSOPT) 22.3-6.8 MG/ML ophthalmic solution 1 drop 2 (two) times daily.    [provider]  hydrALAZINE (APRESOLINE) 10 MG tablet Take 1 tablet (10 mg total) by mouth 3 (three) times daily. 03/19/22   Marylu Lund., NP  memantine (NAMENDA) 10 MG tablet TAKE 1 TABLET BY MOUTH TWICE A DAY 05/19/22   Alric Ran, MD  pantoprazole (PROTONIX) 40 MG tablet Take 1 tablet (40 mg total) by mouth 2 (two) times daily. 07/05/22   Thornton Park, MD  RESTASIS 0.05 % ophthalmic emulsion  01/23/20   [provider]  ROCKLATAN 0.02-0.005 % SOLN INSTILL 1 DROP INTO LEFT EYE AT BEDTIME 02/14/18   [provider]  rosuvastatin (CRESTOR) 10 MG tablet Take 1 tablet (10 mg total) by mouth every other day. 03/19/22   Marylu Lund., NP  valsartan-hydrochlorothiazide (DIOVAN HCT) 80-12.5 MG tablet Take 1 tablet by mouth daily. 01/15/22   Simmons-Robinson, Riki Sheer, MD  Calcium Carbonate-Vitamin D 600-400 MG-UNIT tablet Take 2 tablets by mouth daily. 03/12/20 07/10/20  Simmons-Robinson, Riki Sheer, MD      Allergies    Brimonidine, Oxycodone hcl, Prochlorperazine edisylate, and Latex    Review of Systems   Review of Systems  Constitutional:  Negative for  fever.  Eyes:  Negative for visual disturbance.  Respiratory:  Negative for shortness of breath.   Cardiovascular:  Positive for palpitations. Negative for chest pain.  Gastrointestinal:  Negative for nausea and vomiting.  Musculoskeletal:  Negative for neck pain.  Neurological:  Positive for dizziness and light-headedness. Negative for syncope, weakness, numbness and headaches.  All other systems reviewed and are negative.   Physical Exam Updated  Vital Signs BP (!) 189/94   Pulse 61   Temp 98.7 F (37.1 C)   Resp 15   SpO2 100%  Physical Exam Vitals and nursing note reviewed.  Constitutional:      General: She is not in acute distress.    Appearance: Normal appearance. She is not ill-appearing, toxic-appearing or diaphoretic.  HENT:     Head: Normocephalic and atraumatic.     Nose: Nose normal. No congestion.     Mouth/Throat:     Mouth: Mucous membranes are moist.     Pharynx: Oropharynx is clear.  Eyes:     Extraocular Movements: Extraocular movements intact.     Conjunctiva/sclera: Conjunctivae normal.     Pupils: Pupils are equal, round, and reactive to light.  Cardiovascular:     Rate and Rhythm: Normal rate and regular rhythm.  Pulmonary:     Effort: Pulmonary effort is normal.     Breath sounds: Normal breath sounds. No wheezing.  Abdominal:     General: Abdomen is flat. Bowel sounds are normal.     Palpations: Abdomen is soft.     Tenderness: There is no abdominal tenderness.  Musculoskeletal:     Cervical back: Normal range of motion and neck supple. No tenderness.  Skin:    General: Skin is warm and dry.     Capillary Refill: Capillary refill takes less than 2 seconds.  Neurological:     General: No focal deficit present.     Mental Status: She is alert. Mental status is at baseline.     GCS: GCS eye subscore is 4. GCS verbal subscore is 5. GCS motor subscore is 6.     Cranial Nerves: Cranial nerves 2-12 are intact. No cranial nerve deficit.     Sensory: Sensation is intact. No sensory deficit.     Motor: Motor function is intact. No weakness.     Coordination: Coordination is intact. Heel to [facility] Test normal.     Comments: Cranial nerves II through XII intact.  Intact finger-to-nose, heel-to-shin.  No pronator drift.  No facial droop, slurred speech.  5 out of 5 strength bilateral lower extremities.  5 out of 5 strength bilateral upper extremities.     ED Results / Procedures / Treatments    Labs (all labs ordered are listed, but only abnormal results are displayed) Labs Reviewed  BASIC METABOLIC PANEL - Abnormal; Notable for the following components:      Result Value   Glucose, Bld 112 (*)    Creatinine, Ser 1.35 (*)    GFR, Estimated 40 (*)    All other components within normal limits  CBC - Abnormal; Notable for the following components:   RBC 3.21 (*)    Hemoglobin 10.2 (*)    HCT 30.8 (*)    All other components within normal limits  MAGNESIUM  TROPONIN I (HIGH SENSITIVITY)  TROPONIN I (HIGH SENSITIVITY)    EKG EKG Interpretation  Date/Time:  Monday July 19 2022 16:06:40 EST Ventricular Rate:  66 PR Interval:  124 QRS Duration: 82 QT Interval:  448  QTC Calculation: 469 R Axis:   67 Text Interpretation: Normal sinus rhythm Septal infarct (cited on or before 20-Apr-2018) Abnormal ECG When compared with ECG of 20-Apr-2018 14:27, Questionable change in QRS axis Non-specific change in ST segment in Anterior leads QT has lengthened Confirmed by Tretha Sciara [phone]) on 07/19/2022 9:32:17 PM  Radiology CT Head Wo Contrast  Result Date: 07/19/2022 CLINICAL DATA:  Vertigo, central extreme dizziness EXAM: CT HEAD WITHOUT CONTRAST TECHNIQUE: Contiguous axial images were obtained from the base of the skull through the vertex without intravenous contrast. RADIATION DOSE REDUCTION: This exam was performed according to the departmental dose-optimization program which includes automated exposure control, adjustment of the mA and/or kV according to patient size and/or use of iterative reconstruction technique. COMPARISON:  12/17/2021 FINDINGS: Brain: No acute intracranial abnormality. Specifically, no hemorrhage, hydrocephalus, mass lesion, acute infarction, or significant intracranial injury. Vascular: No hyperdense vessel or unexpected calcification. Skull: No acute calvarial abnormality. Sinuses/Orbits: No acute findings Other: None IMPRESSION: No acute intracranial  abnormality. Electronically Signed   By: Rolm Baptise M.D.   On: 07/19/2022 20:36   DG Chest 2 View  Result Date: 07/19/2022 CLINICAL DATA:  Pain.  Feels heart beating fast. EXAM: CHEST - 2 VIEW COMPARISON:  None Available. FINDINGS: Cardiac silhouette is at the upper limits of normal size. Moderate calcifications within the aortic arch. The lungs appear clear. No pleural effusion pneumothorax. Moderate multilevel disc space narrowing and endplate sclerosis throughout the thoracic spine. IMPRESSION: No active cardiopulmonary disease. Electronically Signed   By: Yvonne Kendall M.D.   On: 07/19/2022 18:03    Procedures Procedures   Medications Ordered in ED Medications  meclizine (ANTIVERT) tablet 25 mg (25 mg Oral Given 07/19/22 2018)    ED Course/ Medical Decision Making/ A&P Clinical Course as of 07/19/22 2139  Mon Jul 19, 2022  2131 Stable 81 YOF with dizziness. Recently diagnosed with HP infection. Started on Triple therapy. Felt light headed earlier today. Non vertiginous. Back to baseline now.  [CC]    Clinical Course User Index [CC] Tretha Sciara, MD                           Medical Decision Making Amount and/or Complexity of Data Reviewed Labs: ordered. Radiology: ordered.   81 year old female presents to the ED for evaluation.  Please see HPI for further details.  On my examination the patient is afebrile and nontachycardic.  The patient sounds clear bilaterally, she is not hypoxic on room air.  Patient abdomen soft and compressible throughout.  The patient neurological examination shows no focal neurodeficits.  Patient has intact finger-nose, heel-to-shin.  Cranial nerves II through XII are intact.  No pronator drift, no slurred speech, no facial droop.  5 out of 5 strength to upper extremities bilaterally and lower extremities bilaterally.  Patient workup initiated in triage includes troponin x 2, EKG, CBC, BMP, chest x-ray, magnesium.  After discussing with patient,  CT head was added on by me.  Patient provided 25 mg of meclizine.  Patient CBC unremarkable, no leukocytosis or anemia.  Patient BMP unremarkable, creatinine stable.  Patient troponin 8 and 8 respectively.  Patient magnesium stable at 1.9.  Patient EKG nonischemic.  Patient chest x-ray shows no consolidations or effusions.  Patient CT head unremarkable.  No herniation, midline shift.  At this time, the patient reports feeling better.  The patient will be discharged home and advised to follow back up with her PCP.  Patient was given return precautions and her and her daughter at the bedside both voiced understanding of these instructions.  The patient had all of her questions answered to her satisfaction.  The patient is stable at this time for discharge home.  Final Clinical Impression(s) / ED Diagnoses Final diagnoses:  Dizziness    Rx / DC Orders ED Discharge Orders     None         Lawana Chambers 07/19/22 2139    Tretha Sciara, MD 07/20/22 2344

## 2022-07-19 NOTE — ED Notes (Signed)
Patient is being discharged from the Urgent Care and sent to the Emergency Department via POC . Per Dr. Windy Carina, patient is in need of higher level of care due to need of further evaluation. Patient is aware and verbalizes understanding of plan of care.  Vitals:   07/19/22 1512  BP: (!) 143/51  Pulse: 73  Resp: 18  Temp: 98 F (36.7 C)

## 2022-07-19 NOTE — Discharge Instructions (Signed)
Please return to the ED with any new or worsening signs or symptoms such as one-sided weakness or numbness, facial droop, slurred speech Please follow-up with your PCP for further management Please read the attached got concerning dizziness

## 2022-07-19 NOTE — ED Triage Notes (Signed)
Pt is being treated for hpylori, started antibx today, and pt started feeling dizzy when she went to stand after taking meds. Feels like heart beating fast.

## 2022-07-19 NOTE — ED Triage Notes (Signed)
Pt c/o dizziness today when getting up from the table. States needs assistance to walk. Provider at triage evaluation pt.

## 2022-07-24 ENCOUNTER — Other Ambulatory Visit: Payer: Self-pay

## 2022-07-24 ENCOUNTER — Inpatient Hospital Stay (HOSPITAL_COMMUNITY)
Admission: EM | Admit: 2022-07-24 | Discharge: 2022-07-26 | DRG: 641 | Disposition: A | Discharge status: Home Health | Payer: PPO | Attending: Internal Medicine | Admitting: Internal Medicine

## 2022-07-24 ENCOUNTER — Encounter (HOSPITAL_COMMUNITY): Payer: Self-pay

## 2022-07-24 ENCOUNTER — Emergency Department (HOSPITAL_COMMUNITY): Payer: PPO

## 2022-07-24 DIAGNOSIS — F0280 Dementia in other diseases classified elsewhere without behavioral disturbance: Secondary | ICD-10-CM | POA: Diagnosis present

## 2022-07-24 DIAGNOSIS — Z970 Presence of artificial eye: Secondary | ICD-10-CM | POA: Diagnosis not present

## 2022-07-24 DIAGNOSIS — N1831 Chronic kidney disease, stage 3a: Secondary | ICD-10-CM | POA: Diagnosis present

## 2022-07-24 DIAGNOSIS — B9681 Helicobacter pylori [H. pylori] as the cause of diseases classified elsewhere: Secondary | ICD-10-CM | POA: Diagnosis not present

## 2022-07-24 DIAGNOSIS — K2970 Gastritis, unspecified, without bleeding: Secondary | ICD-10-CM | POA: Diagnosis not present

## 2022-07-24 DIAGNOSIS — A048 Other specified bacterial intestinal infections: Secondary | ICD-10-CM | POA: Diagnosis not present

## 2022-07-24 DIAGNOSIS — N179 Acute kidney failure, unspecified: Secondary | ICD-10-CM | POA: Diagnosis present

## 2022-07-24 DIAGNOSIS — Z9001 Acquired absence of eye: Secondary | ICD-10-CM | POA: Diagnosis not present

## 2022-07-24 DIAGNOSIS — G309 Alzheimer's disease, unspecified: Secondary | ICD-10-CM | POA: Diagnosis present

## 2022-07-24 DIAGNOSIS — Z91040 Latex allergy status: Secondary | ICD-10-CM | POA: Diagnosis not present

## 2022-07-24 DIAGNOSIS — Z7982 Long term (current) use of aspirin: Secondary | ICD-10-CM | POA: Diagnosis not present

## 2022-07-24 DIAGNOSIS — N183 Chronic kidney disease, stage 3 unspecified: Secondary | ICD-10-CM | POA: Diagnosis not present

## 2022-07-24 DIAGNOSIS — R42 Dizziness and giddiness: Secondary | ICD-10-CM | POA: Diagnosis not present

## 2022-07-24 DIAGNOSIS — Z833 Family history of diabetes mellitus: Secondary | ICD-10-CM | POA: Diagnosis not present

## 2022-07-24 DIAGNOSIS — Z79899 Other long term (current) drug therapy: Secondary | ICD-10-CM | POA: Diagnosis not present

## 2022-07-24 DIAGNOSIS — E871 Hypo-osmolality and hyponatremia: Secondary | ICD-10-CM | POA: Diagnosis not present

## 2022-07-24 DIAGNOSIS — I129 Hypertensive chronic kidney disease with stage 1 through stage 4 chronic kidney disease, or unspecified chronic kidney disease: Secondary | ICD-10-CM | POA: Diagnosis present

## 2022-07-24 DIAGNOSIS — E860 Dehydration: Secondary | ICD-10-CM | POA: Diagnosis not present

## 2022-07-24 DIAGNOSIS — Z90710 Acquired absence of both cervix and uterus: Secondary | ICD-10-CM | POA: Diagnosis not present

## 2022-07-24 DIAGNOSIS — I10 Essential (primary) hypertension: Secondary | ICD-10-CM | POA: Diagnosis not present

## 2022-07-24 DIAGNOSIS — E785 Hyperlipidemia, unspecified: Secondary | ICD-10-CM | POA: Diagnosis not present

## 2022-07-24 DIAGNOSIS — Z885 Allergy status to narcotic agent status: Secondary | ICD-10-CM | POA: Diagnosis not present

## 2022-07-24 DIAGNOSIS — Z801 Family history of malignant neoplasm of trachea, bronchus and lung: Secondary | ICD-10-CM | POA: Diagnosis not present

## 2022-07-24 DIAGNOSIS — R531 Weakness: Secondary | ICD-10-CM

## 2022-07-24 LAB — CBC WITH DIFFERENTIAL/PLATELET
Abs Immature Granulocytes: 0.03 10*3/uL (ref 0.00–0.07)
Basophils Absolute: 0 10*3/uL (ref 0.0–0.1)
Basophils Relative: 0 %
Eosinophils Absolute: 0 10*3/uL (ref 0.0–0.5)
Eosinophils Relative: 0 %
HCT: 31.3 % — ABNORMAL LOW (ref 36.0–46.0)
Hemoglobin: 11.1 g/dL — ABNORMAL LOW (ref 12.0–15.0)
Immature Granulocytes: 0 %
Lymphocytes Relative: 16 %
Lymphs Abs: 1.3 10*3/uL (ref 0.7–4.0)
MCH: 32.3 pg (ref 26.0–34.0)
MCHC: 35.5 g/dL (ref 30.0–36.0)
MCV: 91 fL (ref 80.0–100.0)
Monocytes Absolute: 0.5 10*3/uL (ref 0.1–1.0)
Monocytes Relative: 6 %
Neutro Abs: 6.1 10*3/uL (ref 1.7–7.7)
Neutrophils Relative %: 78 %
Platelets: 231 10*3/uL (ref 150–400)
RBC: 3.44 MIL/uL — ABNORMAL LOW (ref 3.87–5.11)
RDW: 13.6 % (ref 11.5–15.5)
WBC: 7.9 10*3/uL (ref 4.0–10.5)
nRBC: 0 % (ref 0.0–0.2)

## 2022-07-24 LAB — URINALYSIS, ROUTINE W REFLEX MICROSCOPIC
Bacteria, UA: NONE SEEN
Bilirubin Urine: NEGATIVE
Glucose, UA: NEGATIVE mg/dL
Ketones, ur: NEGATIVE mg/dL
Leukocytes,Ua: NEGATIVE
Nitrite: NEGATIVE
Protein, ur: NEGATIVE mg/dL
Specific Gravity, Urine: 1.004 — ABNORMAL LOW (ref 1.005–1.030)
pH: 6 (ref 5.0–8.0)

## 2022-07-24 LAB — COMPREHENSIVE METABOLIC PANEL
ALT: 18 U/L (ref 0–44)
AST: 35 U/L (ref 15–41)
Albumin: 3.9 g/dL (ref 3.5–5.0)
Alkaline Phosphatase: 37 U/L — ABNORMAL LOW (ref 38–126)
Anion gap: 11 (ref 5–15)
BUN: 14 mg/dL (ref 8–23)
CO2: 23 mmol/L (ref 22–32)
Calcium: 9.9 mg/dL (ref 8.9–10.3)
Chloride: 87 mmol/L — ABNORMAL LOW (ref 98–111)
Creatinine, Ser: 1.12 mg/dL — ABNORMAL HIGH (ref 0.44–1.00)
GFR, Estimated: 50 mL/min — ABNORMAL LOW (ref >60)
Glucose, Bld: 84 mg/dL (ref 70–99)
Potassium: 3.9 mmol/L (ref 3.5–5.1)
Sodium: 121 mmol/L — ABNORMAL LOW (ref 135–145)
Total Bilirubin: 0.6 mg/dL (ref 0.3–1.2)
Total Protein: 6.6 g/dL (ref 6.5–8.1)

## 2022-07-24 LAB — NA AND K (SODIUM & POTASSIUM), RAND UR
Potassium Urine: 9 mmol/L
Sodium, Ur: 42 mmol/L

## 2022-07-24 LAB — CREATININE, URINE, RANDOM: Creatinine, Urine: 27 mg/dL

## 2022-07-24 MED ORDER — CARVEDILOL 12.5 MG PO TABS
12.5000 mg | ORAL_TABLET | Freq: Two times a day (BID) | ORAL | Status: DC
  Administered 2022-07-24 – 2022-07-25 (×2): 12.5 mg via ORAL
  Filled 2022-07-24 (×2): qty 1

## 2022-07-24 MED ORDER — SODIUM CHLORIDE 0.9 % IV SOLN: Freq: Once | INTRAVENOUS | Status: AC

## 2022-07-24 MED ORDER — ENOXAPARIN SODIUM 30 MG/0.3ML IJ SOSY
30.0000 mg | PREFILLED_SYRINGE | INTRAMUSCULAR | Status: DC
  Administered 2022-07-25 – 2022-07-26 (×2): 30 mg via SUBCUTANEOUS
  Filled 2022-07-24 (×2): qty 0.3

## 2022-07-24 MED ORDER — HYDRALAZINE HCL 20 MG/ML IJ SOLN
10.0000 mg | Freq: Once | INTRAMUSCULAR | Status: AC
  Administered 2022-07-24: 10 mg via INTRAVENOUS
  Filled 2022-07-24: qty 1

## 2022-07-24 MED ORDER — ENSURE ENLIVE PO LIQD
237.0000 mL | Freq: Two times a day (BID) | ORAL | Status: DC
  Administered 2022-07-26: 237 mL via ORAL
  Filled 2022-07-24: qty 237

## 2022-07-24 NOTE — Assessment & Plan Note (Addendum)
-   Has had progressive weight loss, weakness, low appetite with dizziness concerning for failure to thrive.  She has been receiving workup outpatient for symptoms.  Recently had endoscopy on 07/05/2021 revealing gastric ulcers with H. pylori.  She was just started on triple therapy earlier this week. No improvement thus far in her appetite despite tx.  -CT head negative. No neurological deficits to warrant further head imaging at this time. -will obtain TSH -check Mg given recent PPI use -Check vitamin U27 and folic with anemia although normocytic  -obtain PT evaluation  -consult dietician. Add ensure with meals.

## 2022-07-24 NOTE — Assessment & Plan Note (Signed)
-  Na of 121 which has downward trended from 135. Likely due to decrease oral intake and being on HCTZ.  -will hold HCTZ  -continue IV fluid and repeat BMP later this evening to follow trend -urine osm, urine Na pending

## 2022-07-24 NOTE — ED Provider Triage Note (Signed)
Emergency Medicine Provider Triage Evaluation Note  Meredith Owens , a 81 y.o. female  was evaluated in triage.  Pt complains of dizziness.  Patient reports history of dizziness.  Describes dizziness as "feeling unstable when walking."  Denies symptoms of room spinning.  Was seen at ED approximately 5 days ago and states that meclizine discharged with has not helped at all.  Presents emergency department for further evaluation.  Patient is accompanied by daughter who states the patient symptoms have been getting worse.  No recent falls/traumas.  Review of Systems  Positive: See above Negative:   Physical Exam  BP (!) 194/75 (BP Location: Right Arm)   Pulse 61   Temp 98.6 F (37 C) (Oral)   Resp 16   SpO2 100%  Gen:   Awake, no distress   Resp:  Normal effort  MSK:   Moves extremities without difficulty  Other:    Medical Decision Making  Medically screening exam initiated at 3:33 PM.  Appropriate orders placed.  [name] was informed that the remainder of the evaluation will be completed by another provider, this initial triage assessment does not replace that evaluation, and the importance of remaining in the ED until their evaluation is complete.     Wilnette Kales, [place] 07/24/22 1535
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
## 2022-07-24 NOTE — ED Provider Notes (Signed)
[facility] EMERGENCY DEPARTMENT Provider Note   CSN: [id number] Arrival date & time: 07/24/22  1419     History  Chief Complaint  Patient presents with   Weakness   Dizziness    Meredith Owens is a 81 y.o. female.   Weakness Associated symptoms: dizziness   Dizziness Associated symptoms: weakness    The patient is a 81 year old female with past medical history of Alzheimer's, chronic fatigue, arthritis, depression, HTN, HLD, prediabetes presenting for evaluation of dizziness and weakness.  The patient was last seen 4 days ago on 12/11 with similar complaint.  She was recently started on omeprazole, amoxicillin, clarithromycin, and bismuth for gastric ulcers and her symptoms on 12/11 began shortly after her first dose.  Over the course of the week, the patient has had persistent symptoms of dizziness and weakness.  She recently moved in with her daughter who now manages her medications and diet.  She states that the patient has actually had increased p.o. intake over the past few months.  There have been no noted fevers, recent medication changes, recent falls, chest pain, shortness of breath, abdominal pain, diarrhea, evidence of GI bleeding, or urinary symptoms.     Home Medications Prior to Admission medications   Medication Sig Start Date End Date Taking? Authorizing Provider  amoxicillin (AMOXIL) 500 MG capsule Take 2 capsules (1,000 mg), by mouth, 2 times daily X 14 days. 07/14/22  Yes [provider]  clarithromycin (BIAXIN) 500 MG tablet Take by mouth. 07/14/22 07/28/22 Yes [provider]  omeprazole (PRILOSEC) 40 MG capsule Take 1 capsule by mouth daily. 07/14/22  Yes [provider]  acetaminophen (TYLENOL 8 HOUR) 650 MG CR tablet Take 1 tablet (650 mg total) by mouth every 8 (eight) hours as needed for pain. 06/30/18   Bonnita Hollow, MD  aspirin 81 MG tablet Take 81 mg by mouth daily. 01/17/12   Carolin Guernsey, MD   Bismuth/Metronidaz/Tetracyclin [phone] MG CAPS TAKE 3 CAPSULES BY MOUTH 4 (FOUR) TIMES DAILY - BEFORE MEALS AND AT BEDTIME FOR 10 DAYS. 07/13/22 07/23/22  Thornton Park, MD  carvedilol (COREG) 12.5 MG tablet TAKE 1 TABLET BY MOUTH TWICE A DAY 10/12/21   Gladys Damme, MD  diclofenac sodium (VOLTAREN) 1 % GEL Apply 4 g topically 4 (four) times daily. 05/01/19   Shirley, [place], DO  donepezil (ARICEPT ODT) 5 MG disintegrating tablet Take 2 tablets (10 mg total) by mouth at bedtime. 04/22/22 04/17/23  Alric Ran, MD  dorzolamide-timolol (COSOPT) 22.3-6.8 MG/ML ophthalmic solution 1 drop 2 (two) times daily.    [provider]  hydrALAZINE (APRESOLINE) 10 MG tablet Take 1 tablet (10 mg total) by mouth 3 (three) times daily. 03/19/22   Marylu Lund., NP  memantine (NAMENDA) 10 MG tablet TAKE 1 TABLET BY MOUTH TWICE A DAY 05/19/22   Alric Ran, MD  pantoprazole (PROTONIX) 40 MG tablet Take 1 tablet (40 mg total) by mouth 2 (two) times daily. 07/05/22   Thornton Park, MD  RESTASIS 0.05 % ophthalmic emulsion  01/23/20   [provider]  ROCKLATAN 0.02-0.005 % SOLN INSTILL 1 DROP INTO LEFT EYE AT BEDTIME 02/14/18   [provider]  rosuvastatin (CRESTOR) 10 MG tablet Take 1 tablet (10 mg total) by mouth every other day. 03/19/22   Marylu Lund., NP  valsartan-hydrochlorothiazide (DIOVAN HCT) 80-12.5 MG tablet Take 1 tablet by mouth daily. 01/15/22   Simmons-Robinson, Riki Sheer, MD  Calcium Carbonate-Vitamin D 600-400 MG-UNIT  tablet Take 2 tablets by mouth daily. 03/12/20 07/10/20  Simmons-Robinson, Riki Sheer, MD      Allergies    Brimonidine, Oxycodone hcl, Prochlorperazine edisylate, and Latex    Review of Systems   Review of Systems  Neurological:  Positive for dizziness and weakness.   See HPI  Physical Exam Updated Vital Signs BP (!) 158/57   Pulse 73   Temp 98.1 F (36.7 C) (Oral)   Resp 16   SpO2 100%  Physical Exam Vitals and nursing note  reviewed.  Constitutional:      General: She is not in acute distress.    Appearance: She is well-developed.  HENT:     Head: Normocephalic and atraumatic.     Nose: Nose normal.     Mouth/Throat:     Mouth: Mucous membranes are moist.  Cardiovascular:     Rate and Rhythm: Normal rate and regular rhythm.     Heart sounds: No murmur heard. Pulmonary:     Effort: Pulmonary effort is normal. No respiratory distress.     Breath sounds: Normal breath sounds.  Abdominal:     Palpations: Abdomen is soft.     Tenderness: There is no abdominal tenderness.  Musculoskeletal:        General: No swelling.     Cervical back: Neck supple.  Skin:    General: Skin is warm and dry.     Capillary Refill: Capillary refill takes less than 2 seconds.  Neurological:     General: No focal deficit present.     Mental Status: She is alert and oriented to person, place, and time.     Cranial Nerves: No cranial nerve deficit.     Sensory: No sensory deficit.     Motor: No weakness.     Coordination: Coordination normal.  Psychiatric:        Mood and Affect: Mood normal.     ED Results / Procedures / Treatments   Labs (all labs ordered are listed, but only abnormal results are displayed) Labs Reviewed  COMPREHENSIVE METABOLIC PANEL - Abnormal; Notable for the following components:      Result Value   Sodium 121 (*)    Chloride 87 (*)    Creatinine, Ser 1.12 (*)    Alkaline Phosphatase 37 (*)    GFR, Estimated 50 (*)    All other components within normal limits  CBC WITH DIFFERENTIAL/PLATELET - Abnormal; Notable for the following components:   RBC 3.44 (*)    Hemoglobin 11.1 (*)    HCT 31.3 (*)    All other components within normal limits  URINALYSIS, ROUTINE W REFLEX MICROSCOPIC - Abnormal; Notable for the following components:   Specific Gravity, Urine 1.004 (*)    Hgb urine dipstick SMALL (*)    All other components within normal limits  CREATININE, URINE, RANDOM  NA AND K (SODIUM &  POTASSIUM), RAND UR  OSMOLALITY, URINE  OSMOLALITY  TSH    EKG None  Radiology CT Head Wo Contrast  Result Date: 07/24/2022 CLINICAL DATA:  Dizziness and weakness for proximally 2 weeks after starting new medication. Patient was seen for same complaints 07/19/2022. Symptoms have not improved. EXAM: CT HEAD WITHOUT CONTRAST TECHNIQUE: Contiguous axial images were obtained from the base of the skull through the vertex without intravenous contrast. RADIATION DOSE REDUCTION: This exam was performed according to the departmental dose-optimization program which includes automated exposure control, adjustment of the mA and/or kV according to patient size and/or use of iterative reconstruction  technique. COMPARISON:  CT head without contrast 07/19/2022. FINDINGS: Brain: No acute infarct, hemorrhage, or mass lesion is present. Minimal white matter changes are within normal limits for age. Deep brain nuclei are within normal limits. The ventricles are of normal size. No significant extraaxial fluid collection is present. The brainstem and cerebellum are within normal limits. Vascular: Atherosclerotic calcifications are present within the cavernous internal carotid arteries without significant interval change. No hyperdense vessel is present. Skull: Calvarium is intact. No focal lytic or blastic lesions are present. No significant extracranial soft tissue lesion is present. Sinuses/Orbits: The paranasal sinuses and mastoid air cells are clear. Right globe has been resected. Weighted Larwance Rote is noted. Left lens replacement noted. IMPRESSION: 1. No acute or focal lesion to explain the patient's symptoms. 2. Minimal white matter changes are within normal limits for age. Electronically Signed   By: San Morelle M.D.   On: 07/24/2022 16:58    Procedures Procedures    Medications Ordered in ED Medications  carvedilol (COREG) tablet 12.5 mg (12.5 mg Oral Given 07/24/22 2204)  hydrALAZINE (APRESOLINE)  injection 10 mg (10 mg Intravenous Given 07/24/22 2209)  0.9 %  sodium chloride infusion ( Intravenous New Bag/Given 07/24/22 2232)    ED Course/ Medical Decision Making/ A&P                           Medical Decision Making  The patient is a 81 year old female with past medical history of Alzheimer's, chronic fatigue, arthritis, depression, HTN, HLD, prediabetes presenting for evaluation of dizziness and weakness.  The differential diagnosis considered includes: Anemia, CVA, pneumonia, viral respiratory infection, urinary tract infection, medication side effect, dehydration.  On initial evaluation, the patient was noted to be hypertensive with a systolic blood pressure of 206, without reported vision changes, headache, nausea.  The patient was endorsing weakness but was not currently dizzy.  They were no focal findings on neurological exam and the patient.  The patient was given her home dose of carvedilol and IV hydralazine.  The patient's diagnostic workup which I independently reviewed and interpreted, included a CBC with white blood cell count of 7.9 and hemoglobin of 11.1; urinalysis without signs of infection; CMP with sodium of 121 and creatinine of 1.12.  The patient's labs drawn 5 days ago were reviewed and she was noted to have a sodium of 135 at that time which is consistent with her baseline.  The patient also received a CT head which did not show any sign of acute intracranial abnormality.  The results of the patient's diagnostic workup were discussed with the family who state that the patient has been actively eating more than usual lately.  Urine studies were added and the patient was found to have a urine sodium of 42 and urine potassium 9; urine creatinine was 27.  A TSH and serum and urine Osms were added which were pending.  The patient was admitted to medicine for further management of dizziness and weakness presumed to be secondary to hyponatremia of unknown source.  Amount  and/or Complexity of Data Reviewed Independent Historian: caregiver    Details: Daughter External Data Reviewed: labs and notes. Labs: ordered. Decision-making details documented in ED Course. Radiology: ordered and independent interpretation performed. Decision-making details documented in ED Course.  Risk Prescription drug management.   Patient's presentation is most consistent with acute presentation with potential threat to life or bodily function.         Final Clinical  Impression(s) / ED Diagnoses Final diagnoses:  Hyponatremia  Dizziness    Rx / DC Orders ED Discharge Orders     None         Dani Gobble, MD 07/24/22 2329    Drenda Freeze, MD 07/25/22 1520

## 2022-07-24 NOTE — ED Triage Notes (Signed)
Pt arrived POV from home c/o dizziness and weakness for a couple weeks after being started on some new medications for ulcers. Per family member pt was seen on Monday at [place] for same but the dizziness and weakness have not gotten better.
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
## 2022-07-24 NOTE — Assessment & Plan Note (Addendum)
Underwent upper endoscopy with GI Dr. Tarri Glenn on 07/05/2022 due to unintentional weight loss. Found to have non-bleeding gastric ulcers and biopsy positive for H. pylori gastritis. Stated on therapy 07/19/2022 -continue antibiotics and Bismuth, PPI

## 2022-07-24 NOTE — Assessment & Plan Note (Signed)
Stable. Baseline around 1-1.2.

## 2022-07-24 NOTE — Assessment & Plan Note (Signed)
Elevated but has missed her BP meds today.  -resume home meds. Also given IV '10mg'$  hydralazine in ED.

## 2022-07-25 DIAGNOSIS — Z9001 Acquired absence of eye: Secondary | ICD-10-CM | POA: Diagnosis not present

## 2022-07-25 DIAGNOSIS — R42 Dizziness and giddiness: Secondary | ICD-10-CM | POA: Diagnosis present

## 2022-07-25 DIAGNOSIS — I129 Hypertensive chronic kidney disease with stage 1 through stage 4 chronic kidney disease, or unspecified chronic kidney disease: Secondary | ICD-10-CM | POA: Diagnosis present

## 2022-07-25 DIAGNOSIS — Z79899 Other long term (current) drug therapy: Secondary | ICD-10-CM | POA: Diagnosis not present

## 2022-07-25 DIAGNOSIS — Z7982 Long term (current) use of aspirin: Secondary | ICD-10-CM | POA: Diagnosis not present

## 2022-07-25 DIAGNOSIS — G309 Alzheimer's disease, unspecified: Secondary | ICD-10-CM | POA: Diagnosis present

## 2022-07-25 DIAGNOSIS — E871 Hypo-osmolality and hyponatremia: Secondary | ICD-10-CM | POA: Diagnosis present

## 2022-07-25 DIAGNOSIS — B9681 Helicobacter pylori [H. pylori] as the cause of diseases classified elsewhere: Secondary | ICD-10-CM | POA: Diagnosis present

## 2022-07-25 DIAGNOSIS — Z885 Allergy status to narcotic agent status: Secondary | ICD-10-CM | POA: Diagnosis not present

## 2022-07-25 DIAGNOSIS — F0280 Dementia in other diseases classified elsewhere without behavioral disturbance: Secondary | ICD-10-CM | POA: Diagnosis present

## 2022-07-25 DIAGNOSIS — Z970 Presence of artificial eye: Secondary | ICD-10-CM | POA: Diagnosis not present

## 2022-07-25 DIAGNOSIS — Z90710 Acquired absence of both cervix and uterus: Secondary | ICD-10-CM | POA: Diagnosis not present

## 2022-07-25 DIAGNOSIS — Z801 Family history of malignant neoplasm of trachea, bronchus and lung: Secondary | ICD-10-CM | POA: Diagnosis not present

## 2022-07-25 DIAGNOSIS — E785 Hyperlipidemia, unspecified: Secondary | ICD-10-CM | POA: Diagnosis present

## 2022-07-25 DIAGNOSIS — K2970 Gastritis, unspecified, without bleeding: Secondary | ICD-10-CM | POA: Diagnosis present

## 2022-07-25 DIAGNOSIS — Z91040 Latex allergy status: Secondary | ICD-10-CM | POA: Diagnosis not present

## 2022-07-25 DIAGNOSIS — Z833 Family history of diabetes mellitus: Secondary | ICD-10-CM | POA: Diagnosis not present

## 2022-07-25 DIAGNOSIS — E860 Dehydration: Secondary | ICD-10-CM | POA: Diagnosis present

## 2022-07-25 DIAGNOSIS — N1831 Chronic kidney disease, stage 3a: Secondary | ICD-10-CM | POA: Diagnosis present

## 2022-07-25 DIAGNOSIS — N179 Acute kidney failure, unspecified: Secondary | ICD-10-CM | POA: Diagnosis present

## 2022-07-25 LAB — OSMOLALITY: Osmolality: 262 mOsm/kg — ABNORMAL LOW (ref 275–295)

## 2022-07-25 LAB — BASIC METABOLIC PANEL
Anion gap: 9 (ref 5–15)
Anion gap: 8 (ref 5–15)
BUN: 12 mg/dL (ref 8–23)
BUN: 16 mg/dL (ref 8–23)
CO2: 24 mmol/L (ref 22–32)
CO2: 25 mmol/L (ref 22–32)
Calcium: 9.6 mg/dL (ref 8.9–10.3)
Calcium: 9.6 mg/dL (ref 8.9–10.3)
Chloride: 94 mmol/L — ABNORMAL LOW (ref 98–111)
Chloride: 93 mmol/L — ABNORMAL LOW (ref 98–111)
Creatinine, Ser: 1.13 mg/dL — ABNORMAL HIGH (ref 0.44–1.00)
Creatinine, Ser: 1.14 mg/dL — ABNORMAL HIGH (ref 0.44–1.00)
GFR, Estimated: 49 mL/min — ABNORMAL LOW (ref >60)
GFR, Estimated: 49 mL/min — ABNORMAL LOW (ref >60)
Glucose, Bld: 141 mg/dL — ABNORMAL HIGH (ref 70–99)
Glucose, Bld: 110 mg/dL — ABNORMAL HIGH (ref 70–99)
Potassium: 3.2 mmol/L — ABNORMAL LOW (ref 3.5–5.1)
Potassium: 3.6 mmol/L (ref 3.5–5.1)
Sodium: 127 mmol/L — ABNORMAL LOW (ref 135–145)
Sodium: 126 mmol/L — ABNORMAL LOW (ref 135–145)

## 2022-07-25 LAB — MAGNESIUM: Magnesium: 1.8 mg/dL (ref 1.7–2.4)

## 2022-07-25 LAB — FOLATE: Folate: 8.3 ng/mL (ref >5.9)

## 2022-07-25 LAB — TSH: TSH: 1.311 u[IU]/mL (ref 0.350–4.500)

## 2022-07-25 LAB — VITAMIN B12: Vitamin B-12: 585 pg/mL (ref 180–914)

## 2022-07-25 LAB — OSMOLALITY, URINE: Osmolality, Ur: 176 mOsm/kg — ABNORMAL LOW (ref 300–900)

## 2022-07-25 MED ORDER — DORZOLAMIDE HCL-TIMOLOL MAL 2-0.5 % OP SOLN
1.0000 [drp] | Freq: Two times a day (BID) | OPHTHALMIC | Status: DC
  Administered 2022-07-26: 1 [drp] via OPHTHALMIC
  Filled 2022-07-25: qty 10

## 2022-07-25 MED ORDER — CYCLOSPORINE 0.05 % OP EMUL
1.0000 [drp] | Freq: Two times a day (BID) | OPHTHALMIC | Status: DC
  Administered 2022-07-26: 1 [drp] via OPHTHALMIC
  Filled 2022-07-25 (×4): qty 30

## 2022-07-25 MED ORDER — SODIUM CHLORIDE 1 G PO TABS
1.0000 g | ORAL_TABLET | Freq: Two times a day (BID) | ORAL | Status: DC
  Administered 2022-07-25 – 2022-07-26 (×3): 1 g via ORAL
  Filled 2022-07-25 (×4): qty 1

## 2022-07-25 MED ORDER — PANTOPRAZOLE SODIUM 40 MG PO TBEC
40.0000 mg | DELAYED_RELEASE_TABLET | Freq: Two times a day (BID) | ORAL | Status: DC
  Administered 2022-07-26: 40 mg via ORAL
  Filled 2022-07-25: qty 1

## 2022-07-25 MED ORDER — MEMANTINE HCL 10 MG PO TABS
10.0000 mg | ORAL_TABLET | Freq: Two times a day (BID) | ORAL | Status: DC
  Filled 2022-07-25: qty 1

## 2022-07-25 MED ORDER — SODIUM CHLORIDE 0.9 % IV SOLN: INTRAVENOUS | Status: AC

## 2022-07-25 MED ORDER — ROSUVASTATIN CALCIUM 5 MG PO TABS
10.0000 mg | ORAL_TABLET | ORAL | Status: DC
  Filled 2022-07-25: qty 2

## 2022-07-25 MED ORDER — CLARITHROMYCIN 500 MG PO TABS
500.0000 mg | ORAL_TABLET | Freq: Two times a day (BID) | ORAL | Status: DC
  Administered 2022-07-26: 500 mg via ORAL
  Filled 2022-07-25 (×3): qty 1

## 2022-07-25 MED ORDER — CLARITHROMYCIN 500 MG PO TABS: 500.0000 mg | ORAL_TABLET | Freq: Two times a day (BID) | ORAL | Status: DC

## 2022-07-25 MED ORDER — CARVEDILOL 3.125 MG PO TABS
3.1250 mg | ORAL_TABLET | Freq: Two times a day (BID) | ORAL | Status: DC
  Administered 2022-07-26: 3.125 mg via ORAL
  Filled 2022-07-25 (×2): qty 1

## 2022-07-25 MED ORDER — DONEPEZIL HCL 10 MG PO TABS
10.0000 mg | ORAL_TABLET | Freq: Every day | ORAL | Status: DC
  Filled 2022-07-25: qty 1

## 2022-07-25 MED ORDER — BISMUTH/METRONIDAZ/TETRACYCLIN 140-125-125 MG PO CAPS: 3.0000 | ORAL_CAPSULE | Freq: Three times a day (TID) | ORAL | Status: DC

## 2022-07-25 MED ORDER — ASPIRIN 81 MG PO TBEC
81.0000 mg | DELAYED_RELEASE_TABLET | Freq: Every day | ORAL | Status: DC
  Administered 2022-07-26: 81 mg via ORAL
  Filled 2022-07-25: qty 1

## 2022-07-25 NOTE — Progress Notes (Signed)
PROGRESS NOTE                                                                                                                                                                                                             Patient Demographics:    Meredith Owens, is a 81 y.o. female, DOB - 1941-05-10, YTK:[id number]  Outpatient Primary MD for the patient is Azzie Glatter, FNP    LOS - 0  Admit date - 07/24/2022    Chief Complaint  Patient presents with   Weakness   Dizziness       Brief Narrative (HPI from H&P)   81 y.o. female with medical history significant of dementia, hypertension, CKD 3a, hyperlipidemia recent gastric ulcer secondary to H. Pylori who presents with weakness.  He recently moved in with her daughter few months ago as she is developing some dementia and was having difficulty caring for herself, she is gradually developing generalized weakness with some unintentional weight loss of 6 pounds over several months.  Recent GI workup showed she was positive for H. pylori during her EGD.  In the ER she was found to be severely hypertensive, also appeared dehydrated with hyponatremia and was admitted to the hospital.   Subjective:    Meredith Owens today has, No headache, No chest pain, No abdominal pain - No Nausea, No new weakness tingling or numbness, no SOB.   Assessment  & Plan :    Generalized weakness, gradually progressive dementia with hyponatremia and unintentional weight loss. This could be age-related decline causing generalized weakness and deconditioning, she is already having GI workup for weight loss, hyponatremia could be due to HCTZ which is on hold, general IV fluids, monitor urine osmolality, serum osmolality along with urine electrolytes.  Add salt tablets, PT OT, advance activity, may require SNF.  Stable TSH, B12 and magnesium levels.    Helicobacter pylori (H. pylori) infection  - Underwent  upper endoscopy with GI Dr. Tarri Glenn on 07/05/2022 due to unintentional weight loss. Found to have non-bleeding gastric ulcers and biopsy positive for H. pylori gastritis. Stated on therapy 07/19/2022, continue antibiotics and Bismuth, PPI  Chronic kidney disease, stage 3 (HCC)  Stable. Baseline around 1-1.2.   HTN (hypertension) blood pressure medications adjusted will monitor.  Gradually progressive cognitive decline/dementia.  Continue home medications, at risk for delirium, minimize  narcotics and benzodiazepines.        Condition - Fair  Family Communication  :  None  Code Status :  Full  Consults  :  None  PUD Prophylaxis :    Procedures  :     CT head - Non acute      Disposition Plan  :    Status is: Observation  DVT Prophylaxis  :    enoxaparin (LOVENOX) injection 30 mg Start: 07/25/22 1000    Lab Results  Component Value Date   PLT 231 07/24/2022    Diet :  Diet Order             Diet regular Room service appropriate? Yes; Fluid consistency: Thin  Diet effective now                    Inpatient Medications  Scheduled Meds:  carvedilol  12.5 mg Oral BID WC   enoxaparin (LOVENOX) injection  30 mg Subcutaneous Q24H   feeding supplement  237 mL Oral BID BM   Continuous Infusions:  sodium chloride     sodium chloride 75 mL/hr at 07/25/22 0614   PRN Meds:.  Antibiotics  :    Anti-infectives (From admission, onward)    None         Objective:   Vitals:   07/25/22 0545 07/25/22 0600 07/25/22 0615 07/25/22 0617  BP: (!) 134/53 (!) 116/46 (!) 119/56   Pulse: 63 61 71   Resp: '14 14 17   '$ Temp:    98.7 F (37.1 C)  TempSrc:    Oral  SpO2: 100% 100% 100%     Wt Readings from Last 3 Encounters:  07/05/22 44.9 kg  06/07/22 45.1 kg  04/22/22 45.8 kg     Intake/Output Summary (Last 24 hours) at 07/25/2022 0953 Last data filed at 07/25/2022 9798 Gross per 24 hour  Intake 752.38 ml  Output --  Net 752.38 ml     Physical  Exam  Awake, mildly confused, No new F.N deficits, Normal affect Platter.AT,PERRAL Supple Neck, No JVD,   Symmetrical Chest wall movement, Good air movement bilaterally, CTAB RRR,No Gallops,Rubs or new Murmurs,  +ve B.Sounds, Abd Soft, No tenderness,   No Cyanosis, Clubbing or edema       Data Review:    Recent Labs  Lab 07/19/22 1607 07/24/22 1518  WBC 4.7 7.9  HGB 10.2* 11.1*  HCT 30.8* 31.3*  PLT 193 231  MCV 96.0 91.0  MCH 31.8 32.3  MCHC 33.1 35.5  RDW 14.3 13.6  LYMPHSABS  --  1.3  MONOABS  --  0.5  EOSABS  --  0.0  BASOSABS  --  0.0    Recent Labs  Lab 07/19/22 1607 07/24/22 1518 07/24/22 2140 07/25/22 0149 07/25/22 0500  NA 135 121*  --  127* 126*  K 3.5 3.9  --  3.2* 3.6  CL 99 87*  --  94* 93*  CO2 27 23  --  24 25  ANIONGAP 9 11  --  9 8  GLUCOSE 112* 84  --  141* 110*  BUN 16 14  --  12 16  CREATININE 1.35* 1.12*  --  1.13* 1.14*  AST  --  35  --   --   --   ALT  --  18  --   --   --   ALKPHOS  --  37*  --   --   --   BILITOT  --  0.6  --   --   --   ALBUMIN  --  3.9  --   --   --   TSH  --   --  1.311  --   --   MG 1.9  --   --  1.8  --   CALCIUM 9.7 9.9  --  9.6 9.6      Radiology Reports CT Head Wo Contrast  Result Date: 07/24/2022 CLINICAL DATA:  Dizziness and weakness for proximally 2 weeks after starting new medication. Patient was seen for same complaints 07/19/2022. Symptoms have not improved. EXAM: CT HEAD WITHOUT CONTRAST TECHNIQUE: Contiguous axial images were obtained from the base of the skull through the vertex without intravenous contrast. RADIATION DOSE REDUCTION: This exam was performed according to the departmental dose-optimization program which includes automated exposure control, adjustment of the mA and/or kV according to patient size and/or use of iterative reconstruction technique. COMPARISON:  CT head without contrast 07/19/2022. FINDINGS: Brain: No acute infarct, hemorrhage, or mass lesion is present. Minimal white matter  changes are within normal limits for age. Deep brain nuclei are within normal limits. The ventricles are of normal size. No significant extraaxial fluid collection is present. The brainstem and cerebellum are within normal limits. Vascular: Atherosclerotic calcifications are present within the cavernous internal carotid arteries without significant interval change. No hyperdense vessel is present. Skull: Calvarium is intact. No focal lytic or blastic lesions are present. No significant extracranial soft tissue lesion is present. Sinuses/Orbits: The paranasal sinuses and mastoid air cells are clear. Right globe has been resected. Weighted Larwance Rote is noted. Left lens replacement noted. IMPRESSION: 1. No acute or focal lesion to explain the patient's symptoms. 2. Minimal white matter changes are within normal limits for age. Electronically Signed   By: San Morelle M.D.   On: 07/24/2022 16:58      Signature  -   Lala Lund M.D on 07/25/2022 at 9:53 AM   -  To page go to [email or web address]

## 2022-07-26 DIAGNOSIS — E871 Hypo-osmolality and hyponatremia: Secondary | ICD-10-CM | POA: Diagnosis not present

## 2022-07-26 LAB — BASIC METABOLIC PANEL
Anion gap: 7 (ref 5–15)
BUN: 10 mg/dL (ref 8–23)
CO2: 23 mmol/L (ref 22–32)
Calcium: 9.4 mg/dL (ref 8.9–10.3)
Chloride: 104 mmol/L (ref 98–111)
Creatinine, Ser: 0.95 mg/dL (ref 0.44–1.00)
GFR, Estimated: >60 mL/min (ref >60)
Glucose, Bld: 98 mg/dL (ref 70–99)
Potassium: 3.4 mmol/L — ABNORMAL LOW (ref 3.5–5.1)
Sodium: 134 mmol/L — ABNORMAL LOW (ref 135–145)

## 2022-07-26 LAB — CBC WITH DIFFERENTIAL/PLATELET
Abs Immature Granulocytes: 0.01 10*3/uL (ref 0.00–0.07)
Basophils Absolute: 0 10*3/uL (ref 0.0–0.1)
Basophils Relative: 1 %
Eosinophils Absolute: 0 10*3/uL (ref 0.0–0.5)
Eosinophils Relative: 1 %
HCT: 31.9 % — ABNORMAL LOW (ref 36.0–46.0)
Hemoglobin: 10.8 g/dL — ABNORMAL LOW (ref 12.0–15.0)
Immature Granulocytes: 0 %
Lymphocytes Relative: 36 %
Lymphs Abs: 2.1 10*3/uL (ref 0.7–4.0)
MCH: 32 pg (ref 26.0–34.0)
MCHC: 33.9 g/dL (ref 30.0–36.0)
MCV: 94.4 fL (ref 80.0–100.0)
Monocytes Absolute: 0.6 10*3/uL (ref 0.1–1.0)
Monocytes Relative: 10 %
Neutro Abs: 3 10*3/uL (ref 1.7–7.7)
Neutrophils Relative %: 52 %
Platelets: 211 10*3/uL (ref 150–400)
RBC: 3.38 MIL/uL — ABNORMAL LOW (ref 3.87–5.11)
RDW: 14.3 % (ref 11.5–15.5)
WBC: 5.8 10*3/uL (ref 4.0–10.5)
nRBC: 0 % (ref 0.0–0.2)

## 2022-07-26 LAB — URIC ACID: Uric Acid, Serum: 4.2 mg/dL (ref 2.5–7.1)

## 2022-07-26 LAB — MAGNESIUM: Magnesium: 2 mg/dL (ref 1.7–2.4)

## 2022-07-26 LAB — BRAIN NATRIURETIC PEPTIDE: B Natriuretic Peptide: 78.5 pg/mL (ref 0.0–100.0)

## 2022-07-26 MED ORDER — CARVEDILOL 12.5 MG PO TABS: 12.5000 mg | ORAL_TABLET | Freq: Two times a day (BID) | ORAL | Status: DC

## 2022-07-26 MED ORDER — SODIUM CHLORIDE 0.9 % IV SOLN: INTRAVENOUS | Status: DC

## 2022-07-26 MED ORDER — PROSOURCE PLUS PO LIQD
30.0000 mL | Freq: Two times a day (BID) | ORAL | Status: DC
  Administered 2022-07-26: 30 mL via ORAL
  Filled 2022-07-26: qty 30

## 2022-07-26 MED ORDER — AMOXICILLIN 500 MG PO CAPS
1000.0000 mg | ORAL_CAPSULE | Freq: Two times a day (BID) | ORAL | Status: DC
  Filled 2022-07-26 (×2): qty 2

## 2022-07-26 MED ORDER — POTASSIUM CHLORIDE CRYS ER 20 MEQ PO TBCR
40.0000 meq | EXTENDED_RELEASE_TABLET | Freq: Once | ORAL | Status: AC
  Administered 2022-07-26: 40 meq via ORAL
  Filled 2022-07-26: qty 2

## 2022-07-26 NOTE — Discharge Instructions (Signed)
Follow with Primary MD Azzie Glatter, FNP in 7 days   Get CBC, CMP, Magnesium -  checked next visit with your primary MD   Activity: As tolerated with Full fall precautions use walker/cane & assistance as needed  Disposition Home    Diet: Heart Healthy    Special Instructions: If you have smoked or chewed Tobacco  in the last 2 yrs please stop smoking, stop any regular Alcohol  and or any Recreational drug use.  On your next visit with your primary care physician please Get Medicines reviewed and adjusted.  Please request your Prim.MD to go over all Hospital Tests and Procedure/Radiological results at the follow up, please get all Hospital records sent to your Prim MD by signing hospital release before you go home.  If you experience worsening of your admission symptoms, develop shortness of breath, life threatening emergency, suicidal or homicidal thoughts you must seek medical attention immediately by calling 911 or calling your MD immediately  if symptoms less severe.  You Must read complete instructions/literature along with all the possible adverse reactions/side effects for all the Medicines you take and that have been prescribed to you. Take any new Medicines after you have completely understood and accpet all the possible adverse reactions/side effects.

## 2022-07-26 NOTE — Progress Notes (Addendum)
Spoke with patient's two sons who at the bedside and her daughter [name] via the phone about patient's readiness for discharge. Consulted both Heather with case management and Cyrus with Social Work to inform of daughter's desire for placement. Md Sigh notified for need for order for home health SW. All questions were answered. No other needs verbalized. Completed AVS medication details. Spoke Deanna patient's RN to inform that patient is ready for discharge instructions once the order is entered for homehealth SW. Also asked her to review patient's discharge medications next time of administration since there will be a gap between completing it and patient's discharging home.   Gomez Cleverly SWOT RN

## 2022-07-26 NOTE — TOC Initial Note (Signed)
Transition of Care (TOC) - Initial/Assessment Note   Received secure chat from Team (PT/OT,MD). Recommendations for HHPT and walker. Per MD possible discharge today.   NCM spoke to patient's daughter [name] and discussed above.   [name] concerned patient still has infection and her mobility level. [name] would like to discuss infection with MD. NCM secure chatted MD message.   Discussed home health PT and walker. Daughter in agreement . Ordered walker with Erasmo Downer with [facility] . Gilford Rile will be delivered to patient's room today. Tommi Rumps with [facility] accepted HHPT referral.   Will need orders and [place] orders   Patient Details  Name: TAYLEE GUNNELLS MRN: [id number] Date of Birth: [birth year]  Transition of Care [facility]) CM/SW Contact:    Marilu Favre, RN Phone Number: 07/26/2022, 9:03 AM  Clinical Narrative:                   Expected Discharge Plan: [place] Barriers to Discharge: Continued Medical Work up   Patient Goals and CMS Choice Patient states their goals for this hospitalization and ongoing recovery are:: to return to home CMS Medicare.gov Compare Post Acute Care list provided to:: Patient Represenative (must comment) Choice offered to / list presented to : Spouse  Expected Discharge Plan and Services Expected Discharge Plan: [place]   Discharge Planning Services: CM Consult Post Acute Care Choice: [place] arrangements for the past 2 months: Single Family Home Expected Discharge Date: 07/26/22               DME Arranged: Gilford Rile rolling DME Agency: [employer] Date DME Agency Contacted: 07/26/22 Time DME Agency Contacted: [id number] Representative spoke with at DME Agency: [place]: PT [place]: [place] Date [facility] Agency Contacted: 07/26/22 Time [place] Agency Contacted: 0901 Representative spoke with at [place]: [place] Arrangements/Services Living arrangements for the past 2 months:  [place] Lives with:: Parents Patient language and need for interpreter reviewed:: Yes                 Activities of Daily Living      Permission Sought/Granted      Share Information with NAME: Shanise Balch [phone]  Permission granted to share info w AGENCY: [facility]        Emotional Assessment              Admission diagnosis:  Dizziness [R42] Hyponatremia [E87.1] Patient Active Problem List   Diagnosis Date Noted   Hyponatremia 59/56/3875   Helicobacter pylori (H. pylori) infection 07/24/2022   Hypertrophic toenail 01/13/2022   Impairment of cognitive function 01/11/2022   Change in mental state 12/13/2021   Soft tissue swelling of knee joint 07/05/2021   Depressed mood 04/14/2021   Primary osteoarthritis of left knee 07/10/2020   Primary osteoarthritis of right knee 07/10/2020   OA (osteoarthritis) of knee 02/19/2020   Osteopenia 01/16/2020   Goiter 07/20/2019   Shoulder pain 05/03/2019   Hallux valgus (acquired), left foot 10/20/2018   Ptosis of left eyelid 10/20/2018   Vitamin D insufficiency 08/18/2018   Mild cognitive impairment with memory loss 03/13/2018   Visual impairment 03/13/2018   Cold intolerance 12/07/2016   Weakness 01/20/2016   Skin abnormalities 09/21/2015   Chronic kidney disease, stage 3 ([place]) 02/17/2015   Glaucoma, left eye 01/22/2015   Prediabetes 11/20/2013   Healthcare maintenance 05/30/2012   Hyperplastic colonic polyp 10/12/2011   Primary osteoarthritis  of both knees 06/21/2011   Pain and swelling of knee, right 06/21/2011   Vitreous detachment 11/02/2010   HYPERCHOLESTEROLEMIA 11/17/2006   DEPRESSIVE DISORDER, NOS 10/06/2006   HTN (hypertension) 10/06/2006   PCP:  Azzie Glatter, FNP Pharmacy:   [id number] [id number]: [phone]: [phone] [id number] [id number]: [phone]: [phone]   Social Determinants of Health (SDOH) Interventions    Readmission Risk Interventions     No data to display

## 2022-07-26 NOTE — Evaluation (Signed)
Occupational Therapy Evaluation Patient Details Name: Meredith Owens MRN: [id number] DOB: 1941/04/20 Today's Date: 07/26/2022   History of Present Illness Pt is an 81 y/o female admitted for hyponatremia, dehydration and progressive weakness. Pt with recent H. Pylori infection. PMH: CKD, HTN, dementia   Clinical Impression   PTA, pt lives with family, typically Modified Independent with ADLs, basic IADLs, and mobility with intermittent cane use. Pt presents now with deficits in strength and dynamic standing balance w/ need for RW during mobility. Overall, pt requires min guard for bathroom mobility using RW and min guard for LB ADLs. Encouraged use of RW for mobility at home if still feeling unsteady, as well as initial assist/supervision for showering tasks to maximize safety. Based on current presentation and availability of family to assist, rec HHOT at [facility]. Spoke to pt's daughter via phone regarding eval and she hopes pt will continue to mobilize at acute level to maximize functional abilities at home.      Recommendations for follow up therapy are one component of a multi-disciplinary discharge planning process, led by the attending physician.  Recommendations may be updated based on patient status, additional functional criteria and insurance authorization.   Follow Up Recommendations  Home health OT     Assistance Recommended at Discharge Intermittent Supervision/Assistance  Patient can return home with the following A little help with bathing/dressing/bathroom;Assistance with cooking/housework;Direct supervision/assist for financial management;Direct supervision/assist for medications management;Assist for transportation    Functional Status Assessment  Patient has had a recent decline in their functional status and demonstrates the ability to make significant improvements in function in a reasonable and predictable amount of time.  Equipment Recommendations  Other (comment) (RW)     Recommendations for Other Services       Precautions / Restrictions Precautions Precautions: Fall Restrictions Weight Bearing Restrictions: No      Mobility Bed Mobility Overal bed mobility: Modified Independent                  Transfers Overall transfer level: Needs assistance Equipment used: None, Rolling walker (2 wheels) Transfers: Sit to/from Stand Sit to Stand: Min guard, Supervision           General transfer comment: min guard for initial standing without AD, noted unsteadiness that improved with RW for standing      Balance Overall balance assessment: Needs assistance Sitting-balance support: No upper extremity supported, Feet supported Sitting balance-Leahy Scale: Good     Standing balance support: Bilateral upper extremity supported, During functional activity Standing balance-Leahy Scale: Fair                             ADL either performed or assessed with clinical judgement   ADL Overall ADL's : Needs assistance/impaired Eating/Feeding: Independent Eating/Feeding Details (indicate cue type and reason): preparing coffee seated in recliner at end of session Grooming: Supervision/safety;Standing;Wash/dry hands Grooming Details (indicate cue type and reason): appropriate sequencing of task, minor decreased attention Upper Body Bathing: Set up   Lower Body Bathing: Min guard;Sit to/from stand   Upper Body Dressing : Set up;Sitting   Lower Body Dressing: Min guard;Sit to/from stand   Toilet Transfer: Min guard;Ambulation;Rolling walker (2 wheels);Regular Glass blower/designer Details (indicate cue type and reason): use of grab bar to slowly lower self onto toilet Toileting- Clothing Manipulation and Hygiene: Min guard;Sitting/lateral lean;Sit to/from stand Toileting - Clothing Manipulation Details (indicate cue type and reason): able to perform hygiene, managing clothing  Functional mobility during ADLs: Min guard;Rolling  walker (2 wheels) General ADL Comments: Pt with observable and self reported "wobbly" in standing requiring DME use for mobility     Vision Baseline Vision/History: 1 Wears glasses;2 Legally blind Ability to See in Adequate Light: 2 Moderately impaired Patient Visual Report: No change from baseline Vision Assessment?: Vision impaired- to be further tested in functional context Additional Comments: R prosthetic eye per pt     Perception     Praxis      Pertinent Vitals/Pain Pain Assessment Pain Assessment: No/denies pain     Hand Dominance Right   Extremity/Trunk Assessment Upper Extremity Assessment Upper Extremity Assessment: Generalized weakness   Lower Extremity Assessment Lower Extremity Assessment: Defer to PT evaluation   Cervical / Trunk Assessment Cervical / Trunk Assessment: Normal   Communication Communication Communication: No difficulties   Cognition Arousal/Alertness: Awake/alert Behavior During Therapy: WFL for tasks assessed/performed, Flat affect Overall Cognitive Status: History of cognitive impairments - at baseline                                 General Comments: hx of early dementia per chart, minor memory deficits and decreased recall of recent events though does show insight into deficits (feeling wobbly, opting to use walker, etc)     General Comments  Systolic BP initially 517O decreasing to 160s with activity    Exercises     Shoulder Instructions      Home Living Family/patient expects to be discharged to:: Private residence Living Arrangements: Children;Other relatives Available Help at Discharge: Family;Available 24 hours/day Type of Home: House Home Access: Stairs to enter [employer] of Steps: 3   Home Layout: One level     Bathroom Shower/Tub: Occupational psychologist: Standard     Home Equipment: Marine scientist - single point          Prior Functioning/Environment Prior Level of  Function : Independent/Modified Independent             Mobility Comments: intermittent cane use ADLs Comments: MOD I for ADLs, cleans her room/bathroom, basic kitchen tasks        OT Problem List: Decreased strength;Impaired balance (sitting and/or standing);Decreased activity tolerance;Decreased cognition;Decreased knowledge of use of DME or AE      OT Treatment/Interventions: Self-care/ADL training;Therapeutic exercise;Energy conservation;DME and/or AE instruction;Therapeutic activities    OT Goals(Current goals can be found in the care plan section) Acute Rehab OT Goals Patient Stated Goal: improve strength, get back home OT Goal Formulation: With patient Time For Goal Achievement: 08/09/22 Potential to Achieve Goals: Good ADL Goals Pt Will Perform Lower Body Bathing: with modified independence;sit to/from stand Pt Will Perform Lower Body Dressing: with modified independence;sit to/from stand Pt Will Transfer to Toilet: with modified independence;ambulating Pt/caregiver will Perform Home Exercise Program: Increased strength;Both right and left upper extremity;With theraband;With written HEP provided;With Supervision  OT Frequency: Min 2X/week    Co-evaluation              AM-PAC OT "6 Clicks" Daily Activity     Outcome Measure Help from another person eating meals?: None Help from another person taking care of personal grooming?: A Little Help from another person toileting, which includes using toliet, bedpan, or urinal?: A Little Help from another person bathing (including washing, rinsing, drying)?: A Little Help from another person to put on and taking off regular upper body clothing?: A Little  Help from another person to put on and taking off regular lower body clothing?: A Little 6 Click Score: 19   End of Session Equipment Utilized During Treatment: Gait belt;Rolling walker (2 wheels) Nurse Communication: Mobility status  Activity Tolerance: Patient  tolerated treatment well Patient left: in chair;with call bell/phone within reach;with chair alarm set  OT Visit Diagnosis: Unsteadiness on feet (R26.81);Other abnormalities of gait and mobility (R26.89);Muscle weakness (generalized) (M62.81)                Time: 4830-7354 OT Time Calculation (min): 32 min Charges:  OT General Charges $OT Visit: 1 Visit OT Evaluation $OT Eval Low Complexity: 1 Low OT Treatments $Self Care/Home Management : 8-22 mins  Malachy Chamber, OTR/L Acute Rehab Services Office: [phone]   Layla Maw 07/26/2022, 8:19 AM

## 2022-07-26 NOTE — Evaluation (Signed)
Physical Therapy Evaluation Patient Details Name: Meredith Owens MRN: [id number] DOB: 05/28/1941 Today's Date: 07/26/2022  History of Present Illness  Pt is an 81 y/o female admitted for hyponatremia, dehydration and progressive weakness. Pt with recent H. Pylori infection. PMH: CKD, HTN, dementia  Clinical Impression  Pt requires very minimal assistance with ambulation and with navigating stairs per home set up. Pt does demonstrate impaired balance and is currently reporting occasional use of SPC at home. Due to weakness and impaired balance recommend RW for home with HHPT services on discharge from acute care hospital setting in order to decrease risk for falls, injury and re-hospitalization. Pt demonstrates no cardiac/respiratory distress signs/symptoms throughout session today.        Recommendations for follow up therapy are one component of a multi-disciplinary discharge planning process, led by the attending physician.  Recommendations may be updated based on patient status, additional functional criteria and insurance authorization.  Follow Up Recommendations Home health PT      Assistance Recommended at Discharge Intermittent Supervision/Assistance  Patient can return home with the following  A little help with walking and/or transfers;Help with stairs or ramp for entrance;Assist for transportation;Assistance with cooking/housework    Equipment Recommendations Rolling walker (2 wheels)  Recommendations for Other Services       Functional Status Assessment Patient has had a recent decline in their functional status and demonstrates the ability to make significant improvements in function in a reasonable and predictable amount of time.     Precautions / Restrictions Precautions Precautions: Fall Restrictions Weight Bearing Restrictions: No      Mobility  Bed Mobility               General bed mobility comments: Pt received in recliner Patient Response:  Cooperative  Transfers Overall transfer level: Needs assistance Equipment used: None, Rolling walker (2 wheels) Transfers: Sit to/from Stand Sit to Stand: Supervision           General transfer comment: No significant unsteadyness noted with or without RW on standing from sitting in recliner pt perform 4x from recliner with and without AD    Ambulation/Gait Ambulation/Gait assistance: Supervision Gait Distance (Feet): 200 Feet Assistive device: Rolling walker (2 wheels) Gait Pattern/deviations: Step-through pattern, Decreased step length - left, Decreased step length - right   Gait velocity interpretation: <1.8 ft/sec, indicate of risk for recurrent falls      Stairs Stairs: Yes Stairs assistance: Min assist Stair Management: No rails Number of Stairs: 2 General stair comments: HHA with no rails due to pt stating that she has no rails at home and someone helps her.  Wheelchair Mobility    Modified Rankin (Stroke Patients Only)       Balance Overall balance assessment: Needs assistance Sitting-balance support: No upper extremity supported, Feet supported Sitting balance-Leahy Scale: Good     Standing balance support: Bilateral upper extremity supported, During functional activity Standing balance-Leahy Scale: Fair Standing balance comment: Pt has multi directional sway without AD with functional mobility activities             Pertinent Vitals/Pain Pain Assessment Pain Assessment: No/denies pain    Home Living Family/patient expects to be discharged to:: Private residence Living Arrangements: Children;Other relatives Available Help at Discharge: Family;Available 24 hours/day Type of Home: House Home Access: Stairs to enter Entrance Stairs-Rails:  (Pt unsure) Entrance Stairs-Number of Steps: 3   Home Layout: One level Home Equipment: Shower seat;Cane - single point;Grab bars - tub/shower  Prior Function Prior Level of Function :  Independent/Modified Independent             Mobility Comments: intermittent cane use ADLs Comments: MOD I for ADLs, cleans her room/bathroom, basic kitchen tasks     Hand Dominance   Dominant Hand: Right    Extremity/Trunk Assessment   Upper Extremity Assessment Upper Extremity Assessment: Defer to OT evaluation    Lower Extremity Assessment Lower Extremity Assessment: Generalized weakness    Cervical / Trunk Assessment Cervical / Trunk Assessment: Normal  Communication   Communication: No difficulties  Cognition Arousal/Alertness: Awake/alert Behavior During Therapy: WFL for tasks assessed/performed, Flat affect Overall Cognitive Status: History of cognitive impairments - at baseline             General Comments: hx of early dementia per chart. Minor memory deficits throughout evaluation with home set up        General Comments General comments (skin integrity, edema, etc.): Systolic BP initially 161W decreasing to 160s with activity        Assessment/Plan    PT Assessment Patient needs continued PT services  PT Problem List Decreased strength;Decreased activity tolerance;Decreased mobility;Decreased balance       PT Treatment Interventions Stair training;Therapeutic activities;Balance training;Functional mobility training;Therapeutic exercise;Neuromuscular re-education;Patient/family education;Gait training;Manual techniques    PT Goals (Current goals can be found in the Care Plan section)  Acute Rehab PT Goals Patient Stated Goal: Return home with family. PT Goal Formulation: With patient Time For Goal Achievement: 08/09/22 Potential to Achieve Goals: Good    Frequency Min 3X/week        AM-PAC PT "6 Clicks" Mobility  Outcome Measure Help needed turning from your back to your side while in a flat bed without using bedrails?: None Help needed moving from lying on your back to sitting on the side of a flat bed without using bedrails?:  None Help needed moving to and from a bed to a chair (including a wheelchair)?: A Little Help needed standing up from a chair using your arms (e.g., wheelchair or bedside chair)?: A Little Help needed to walk in hospital room?: A Little Help needed climbing 3-5 steps with a railing? : A Little 6 Click Score: 20    End of Session Equipment Utilized During Treatment: Gait belt Activity Tolerance: Patient tolerated treatment well Patient left: in chair;with chair alarm set;with call bell/phone within reach Nurse Communication: Mobility status;Other (comment) (notified MD and CM) PT Visit Diagnosis: Unsteadiness on feet (R26.81);Muscle weakness (generalized) (M62.81)    Time: 0940-1007 PT Time Calculation (min) (ACUTE ONLY): 27 min   Charges:   PT Evaluation $PT Eval Low Complexity: 1 Low PT Treatments $Gait Training: 8-22 mins       Tomma Rakers, DPT, CLT  Acute Rehabilitation Services Office: [phone] (Secure chat preferred)   Ander Purpura 07/26/2022, 10:32 AM

## 2022-07-26 NOTE — Discharge Summary (Signed)
Meredith Owens [id number]:[id number] DOB: 05-12-1941 DOA: 07/24/2022  PCP: Azzie Glatter, FNP  Admit date: 07/24/2022  Discharge date: 07/26/2022  Admitted From: Home   Disposition:  Home   Recommendations for Outpatient Follow-up:   Follow up with PCP in 1-2 weeks  PCP Please obtain BMP/CBC, 2 view CXR in 1week,  (see Discharge instructions)   PCP Please follow up on the following pending results: Blood pressure, CBC, CMP and magnesium levels closely.   Home Health: PT, OT   Equipment/Devices: as below  Consultations: None  Discharge Condition: Stable    CODE STATUS: Full    Diet Recommendation: Heart Healthy     Chief Complaint  Patient presents with   Weakness   Dizziness     Brief history of present illness from the day of admission and additional interim summary    81 y.o. female with medical history significant of dementia, hypertension, CKD 3a, hyperlipidemia recent gastric ulcer secondary to H. Pylori who presents with weakness.  He recently moved in with her daughter few months ago as she is developing some dementia and was having difficulty caring for herself, she is gradually developing generalized weakness with some unintentional weight loss of 6 pounds over several months.  Recent GI workup showed she was positive for H. pylori during her EGD.  In the ER she was found to be severely hypertensive, also appeared dehydrated with hyponatremia and was admitted to the hospital.                                                                  Hospital Course   Generalized weakness, gradually progressive dementia with hyponatremia and unintentional weight loss. This could be age-related decline causing generalized weakness and deconditioning, she is already having GI workup for weight loss, hyponatremia  was due to poor oral intake and being on HCTZ, she was hydrated with IV fluids with much improvement in her hyponatremia and dehydration, relatively symptom-free except for generalized weakness, did well with PT OT and modified for home PT OT which she will get along with a walker, will be discharged home with close outpatient PCP follow-up, she had stable TSH, B12 and magnesium levels.   Helicobacter pylori (H. pylori) infection  - Underwent upper endoscopy with GI Dr. Tarri Glenn on 07/05/2022 due to unintentional weight loss. Found to have non-bleeding gastric ulcers and biopsy positive for H. pylori gastritis. Stated on therapy 07/19/2022, continue antibiotics and Bismuth, PPI, follow-up with GI postdischarge.  Chronic kidney disease, stage 3 (HCC)  Stable and at baseline after hydration.   HTN (hypertension) blood pressure little low, now stable on home dose Coreg, ARB HCTZ discontinued as she was admitted hypotensive, dehydrated with hyponatremia and AKI.  PCP to monitor and adjust.   Gradually progressive cognitive decline/dementia.  Continue  home medications, able here no acute issues.     Discharge diagnosis     Principal Problem:   Hyponatremia Active Problems:   HTN (hypertension)   Chronic kidney disease, stage 3 (HCC)   Weakness   Helicobacter pylori (H. pylori) infection    Discharge instructions    Discharge Instructions     Diet - low sodium heart healthy   Complete by: As directed    Discharge instructions   Complete by: As directed    Follow with Primary MD Azzie Glatter, FNP in 7 days   Get CBC, CMP, Magnesium -  checked next visit with your primary MD   Activity: As tolerated with Full fall precautions use walker/cane & assistance as needed  Disposition Home    Diet: Heart Healthy    Special Instructions: If you have smoked or chewed Tobacco  in the last 2 yrs please stop smoking, stop any regular Alcohol  and or any Recreational drug use.  On your next  visit with your primary care physician please Get Medicines reviewed and adjusted.  Please request your Prim.MD to go over all Hospital Tests and Procedure/Radiological results at the follow up, please get all Hospital records sent to your Prim MD by signing hospital release before you go home.  If you experience worsening of your admission symptoms, develop shortness of breath, life threatening emergency, suicidal or homicidal thoughts you must seek medical attention immediately by calling 911 or calling your MD immediately  if symptoms less severe.  You Must read complete instructions/literature along with all the possible adverse reactions/side effects for all the Medicines you take and that have been prescribed to you. Take any new Medicines after you have completely understood and accpet all the possible adverse reactions/side effects.   Increase activity slowly   Complete by: As directed        Discharge Medications   Allergies as of 07/26/2022       Reactions   Brimonidine Other (See Comments)   Red eyes   Oxycodone Hcl    REACTION: Hives   Prochlorperazine Edisylate    REACTION: anaphylaxis   Latex Itching, Rash        Medication List     STOP taking these medications    omeprazole 40 MG capsule Commonly known as: PRILOSEC   valsartan-hydrochlorothiazide 80-12.5 MG tablet Commonly known as: Diovan HCT       TAKE these medications    acetaminophen 650 MG CR tablet Commonly known as: Tylenol 8 Hour Take 1 tablet (650 mg total) by mouth every 8 (eight) hours as needed for pain.   amoxicillin 500 MG capsule Commonly known as: AMOXIL Take 2 capsules (1,000 mg), by mouth, 2 times daily X 14 days.   aspirin 81 MG tablet Take 81 mg by mouth daily.   Bismuth/Metronidaz/Tetracyclin 140-125-125 MG Caps TAKE 3 CAPSULES BY MOUTH 4 (FOUR) TIMES DAILY - BEFORE MEALS AND AT BEDTIME FOR 10 DAYS.   carvedilol 12.5 MG tablet Commonly known as: COREG TAKE 1 TABLET BY  MOUTH TWICE A DAY   clarithromycin 500 MG tablet Commonly known as: BIAXIN Take by mouth.   diclofenac sodium 1 % Gel Commonly known as: Voltaren Apply 4 g topically 4 (four) times daily.   donepezil 5 MG disintegrating tablet Commonly known as: ARICEPT ODT Take 2 tablets (10 mg total) by mouth at bedtime.   dorzolamide-timolol 2-0.5 % ophthalmic solution Commonly known as: COSOPT 1 drop 2 (two) times daily.   hydrALAZINE  10 MG tablet Commonly known as: APRESOLINE Take 1 tablet (10 mg total) by mouth 3 (three) times daily.   memantine 10 MG tablet Commonly known as: NAMENDA TAKE 1 TABLET BY MOUTH TWICE A DAY   pantoprazole 40 MG tablet Commonly known as: PROTONIX Take 1 tablet (40 mg total) by mouth 2 (two) times daily.   Restasis 0.05 % ophthalmic emulsion Generic drug: cycloSPORINE   Rocklatan 0.02-0.005 % Soln Generic drug: Netarsudil-Latanoprost Place 1 drop into the left eye at bedtime.   rosuvastatin 10 MG tablet Commonly known as: CRESTOR Take 1 tablet (10 mg total) by mouth every other day.               Durable Medical Equipment  (From admission, onward)           Start     Ordered   07/26/22 0856  For home use only DME 4 wheeled rolling walker with seat  Once       Question:  Patient needs a walker to treat with the following condition  Answer:  Weakness   07/26/22 0855   07/26/22 0853  For home use only DME Walker rolling  Once       Question Answer Comment  Walker: With 5 Inch Wheels   Patient needs a walker to treat with the following condition Weakness      07/26/22 0853             Follow-up Information     Azzie Glatter, FNP. Schedule an appointment as soon as possible for a visit in 1 week(s).   Specialty: Family Medicine Contact information: [address] [place] [address] [phone]                 Major procedures and Radiology Reports - PLEASE review detailed and final reports  thoroughly  -      CT Head Wo Contrast  Result Date: 07/24/2022 CLINICAL DATA:  Dizziness and weakness for proximally 2 weeks after starting new medication. Patient was seen for same complaints 07/19/2022. Symptoms have not improved. EXAM: CT HEAD WITHOUT CONTRAST TECHNIQUE: Contiguous axial images were obtained from the base of the skull through the vertex without intravenous contrast. RADIATION DOSE REDUCTION: This exam was performed according to the departmental dose-optimization program which includes automated exposure control, adjustment of the mA and/or kV according to patient size and/or use of iterative reconstruction technique. COMPARISON:  CT head without contrast 07/19/2022. FINDINGS: Brain: No acute infarct, hemorrhage, or mass lesion is present. Minimal white matter changes are within normal limits for age. Deep brain nuclei are within normal limits. The ventricles are of normal size. No significant extraaxial fluid collection is present. The brainstem and cerebellum are within normal limits. Vascular: Atherosclerotic calcifications are present within the cavernous internal carotid arteries without significant interval change. No hyperdense vessel is present. Skull: Calvarium is intact. No focal lytic or blastic lesions are present. No significant extracranial soft tissue lesion is present. Sinuses/Orbits: The paranasal sinuses and mastoid air cells are clear. Right globe has been resected. Weighted Larwance Rote is noted. Left lens replacement noted. IMPRESSION: 1. No acute or focal lesion to explain the patient's symptoms. 2. Minimal white matter changes are within normal limits for age. Electronically Signed   By: San Morelle M.D.   On: 07/24/2022 16:58   CT Head Wo Contrast  Result Date: 07/19/2022 CLINICAL DATA:  Vertigo, central extreme dizziness EXAM: CT HEAD WITHOUT CONTRAST TECHNIQUE: Contiguous axial images were  obtained from the base of the skull through the vertex without  intravenous contrast. RADIATION DOSE REDUCTION: This exam was performed according to the departmental dose-optimization program which includes automated exposure control, adjustment of the mA and/or kV according to patient size and/or use of iterative reconstruction technique. COMPARISON:  12/17/2021 FINDINGS: Brain: No acute intracranial abnormality. Specifically, no hemorrhage, hydrocephalus, mass lesion, acute infarction, or significant intracranial injury. Vascular: No hyperdense vessel or unexpected calcification. Skull: No acute calvarial abnormality. Sinuses/Orbits: No acute findings Other: None IMPRESSION: No acute intracranial abnormality. Electronically Signed   By: Rolm Baptise M.D.   On: 07/19/2022 20:36   DG Chest 2 View  Result Date: 07/19/2022 CLINICAL DATA:  Pain.  Feels heart beating fast. EXAM: CHEST - 2 VIEW COMPARISON:  None Available. FINDINGS: Cardiac silhouette is at the upper limits of normal size. Moderate calcifications within the aortic arch. The lungs appear clear. No pleural effusion pneumothorax. Moderate multilevel disc space narrowing and endplate sclerosis throughout the thoracic spine. IMPRESSION: No active cardiopulmonary disease. Electronically Signed   By: Yvonne Kendall M.D.   On: 07/19/2022 18:03      Today   Subjective    Meredith Owens today has no headache,no chest abdominal pain,no new weakness tingling or numbness, feels much better wants to go home today.    Objective   Blood pressure (!) 149/89, pulse 74, temperature 98 F (36.7 C), temperature source Skin, resp. rate 17, SpO2 100 %.   Intake/Output Summary (Last 24 hours) at 07/26/2022 0858 Last data filed at 07/26/2022 0340 Gross per 24 hour  Intake --  Output 2400 ml  Net -2400 ml    Exam  Awake Alert, No new F.N deficits,    [place].AT,PERRAL Supple Neck,   Symmetrical Chest wall movement, Good air movement bilaterally, CTAB RRR,No Gallops,   +ve B.Sounds, Abd Soft, Non tender,  No  Cyanosis, Clubbing or edema    Data Review   Recent Labs  Lab 07/19/22 1607 07/24/22 1518 07/26/22 0425  WBC 4.7 7.9 5.8  HGB 10.2* 11.1* 10.8*  HCT 30.8* 31.3* 31.9*  PLT 193 231 211  MCV 96.0 91.0 94.4  MCH 31.8 32.3 32.0  MCHC 33.1 35.5 33.9  RDW 14.3 13.6 14.3  LYMPHSABS  --  1.3 2.1  MONOABS  --  0.5 0.6  EOSABS  --  0.0 0.0  BASOSABS  --  0.0 0.0    Recent Labs  Lab 07/19/22 1607 07/24/22 1518 07/24/22 2140 07/25/22 0149 07/25/22 0500 07/26/22 0425  NA 135 121*  --  127* 126* 134*  K 3.5 3.9  --  3.2* 3.6 3.4*  CL 99 87*  --  94* 93* 104  CO2 27 23  --  '24 25 23  '$ ANIONGAP 9 11  --  '9 8 7  '$ GLUCOSE 112* 84  --  141* 110* 98  BUN 16 14  --  '12 16 10  '$ CREATININE 1.35* 1.12*  --  1.13* 1.14* 0.95  AST  --  35  --   --   --   --   ALT  --  18  --   --   --   --   ALKPHOS  --  37*  --   --   --   --   BILITOT  --  0.6  --   --   --   --   ALBUMIN  --  3.9  --   --   --   --  TSH  --   --  1.311  --   --   --   BNP  --   --   --   --   --  78.5  MG 1.9  --   --  1.8  --  2.0  CALCIUM 9.7 9.9  --  9.6 9.6 9.4     Total Time in preparing paper work, data evaluation and todays exam - 35 minutes  Signature  -    Lala Lund M.D on 07/26/2022 at 8:58 AM   -  To page go to [email or web address]

## 2022-07-26 NOTE — TOC CM/SW Note (Signed)
Received secure chat from team that patient can discharge home with HHPT. DME recommendations walker. NCM called daughter Vaughan Sine [phone] and left voicemail with direct call back number. Await return call.

## 2022-07-27 ENCOUNTER — Telehealth: Payer: Self-pay | Admitting: *Deleted

## 2022-07-27 DIAGNOSIS — F330 Major depressive disorder, recurrent, mild: Secondary | ICD-10-CM | POA: Diagnosis not present

## 2022-07-27 DIAGNOSIS — H409 Unspecified glaucoma: Secondary | ICD-10-CM | POA: Diagnosis not present

## 2022-07-27 DIAGNOSIS — I10 Essential (primary) hypertension: Secondary | ICD-10-CM | POA: Diagnosis not present

## 2022-07-27 DIAGNOSIS — H04122 Dry eye syndrome of left lacrimal gland: Secondary | ICD-10-CM | POA: Diagnosis not present

## 2022-07-27 DIAGNOSIS — E785 Hyperlipidemia, unspecified: Secondary | ICD-10-CM | POA: Diagnosis not present

## 2022-07-27 DIAGNOSIS — N183 Chronic kidney disease, stage 3 unspecified: Secondary | ICD-10-CM | POA: Diagnosis not present

## 2022-07-27 DIAGNOSIS — K219 Gastro-esophageal reflux disease without esophagitis: Secondary | ICD-10-CM | POA: Diagnosis not present

## 2022-07-27 DIAGNOSIS — I129 Hypertensive chronic kidney disease with stage 1 through stage 4 chronic kidney disease, or unspecified chronic kidney disease: Secondary | ICD-10-CM | POA: Diagnosis not present

## 2022-07-27 DIAGNOSIS — H5440 Blindness, one eye, unspecified eye: Secondary | ICD-10-CM | POA: Diagnosis not present

## 2022-07-27 DIAGNOSIS — G8929 Other chronic pain: Secondary | ICD-10-CM | POA: Diagnosis not present

## 2022-07-27 DIAGNOSIS — G309 Alzheimer's disease, unspecified: Secondary | ICD-10-CM | POA: Diagnosis not present

## 2022-07-27 DIAGNOSIS — F0280 Dementia in other diseases classified elsewhere without behavioral disturbance: Secondary | ICD-10-CM | POA: Diagnosis not present

## 2022-07-27 NOTE — Patient Outreach (Signed)
  Care Coordination TOC Note Transition Care Management Follow-up Telephone Call Date of discharge and from where: [facility]  How have you been since you were released from the hospital? Doing ok but she is not able to stay by herself. Any questions or concerns? Yes Poor appetite Items Reviewed: Did the pt receive and understand the discharge instructions provided? Yes  Medications obtained and verified? Yes  Other? No  Any new allergies since your discharge? No  Dietary orders reviewed? No Do you have support at home? Yes   Home Care and Equipment/Supplies: Were home health services ordered? not applicable If so, what is the name of the agency? [facility]  Has the agency set up a time to come to the patient's home? yes Were any new equipment or medical supplies ordered?  Yes: rolling walker What is the name of the medical supply agency? [facility] Were you able to get the supplies/equipment? No RN gave patient daughter the number to [facility] to follow up. Do you have any questions related to the use of the equipment or supplies? No  Functional Questionnaire: (I = Independent and D = Dependent) ADLs: D  Bathing/Dressing- D  Meal Prep- D  Eating- I  Maintaining continence- I  Transferring/Ambulation- I  Managing Meds- D  Follow up appointments reviewed:  PCP Hospital f/u appt confirmed? Yes  [facility] f/u appt confirmed? No   Are transportation arrangements needed? No  If their condition worsens, is the pt aware to call PCP or go to the Emergency Dept.? Yes Was the patient provided with contact information for the PCP's office or ED? Yes Was to pt encouraged to call back with questions or concerns? Yes  SDOH assessments and interventions completed:   Yes SDOH Interventions Today    Flowsheet Row Most Recent Value  SDOH Interventions   Food Insecurity Interventions Intervention Not Indicated  Housing Interventions Intervention Not Indicated   Transportation Interventions Intervention Not Indicated       Care Coordination Interventions:  RN discussed nutritional supplements such as boost, ensure and possibly add ice cream and make milk shakes. RN discussed adequate H2O  intake. RN instructed patient daughter to have NP tell the patient how much she should drink , she might follow her orders since she won't take daughter advise.     Encounter Outcome:  Pt. Visit Completed    [facility] [phone]

## 2022-07-28 DIAGNOSIS — R42 Dizziness and giddiness: Secondary | ICD-10-CM | POA: Diagnosis not present

## 2022-07-28 DIAGNOSIS — I10 Essential (primary) hypertension: Secondary | ICD-10-CM | POA: Diagnosis not present

## 2022-07-28 DIAGNOSIS — R632 Polyphagia: Secondary | ICD-10-CM | POA: Diagnosis not present

## 2022-07-28 DIAGNOSIS — R630 Anorexia: Secondary | ICD-10-CM | POA: Diagnosis not present

## 2022-07-28 DIAGNOSIS — E876 Hypokalemia: Secondary | ICD-10-CM | POA: Diagnosis not present

## 2022-07-28 DIAGNOSIS — F03A0 Unspecified dementia, mild, without behavioral disturbance, psychotic disturbance, mood disturbance, and anxiety: Secondary | ICD-10-CM | POA: Diagnosis not present

## 2022-07-28 DIAGNOSIS — R002 Palpitations: Secondary | ICD-10-CM | POA: Diagnosis not present

## 2022-07-28 DIAGNOSIS — E871 Hypo-osmolality and hyponatremia: Secondary | ICD-10-CM | POA: Diagnosis not present

## 2022-07-29 DIAGNOSIS — B9681 Helicobacter pylori [H. pylori] as the cause of diseases classified elsewhere: Secondary | ICD-10-CM | POA: Diagnosis not present

## 2022-07-29 DIAGNOSIS — E871 Hypo-osmolality and hyponatremia: Secondary | ICD-10-CM | POA: Diagnosis not present

## 2022-07-29 DIAGNOSIS — Z7982 Long term (current) use of aspirin: Secondary | ICD-10-CM | POA: Diagnosis not present

## 2022-07-29 DIAGNOSIS — N179 Acute kidney failure, unspecified: Secondary | ICD-10-CM | POA: Diagnosis not present

## 2022-07-29 DIAGNOSIS — K2970 Gastritis, unspecified, without bleeding: Secondary | ICD-10-CM | POA: Diagnosis not present

## 2022-07-29 DIAGNOSIS — E785 Hyperlipidemia, unspecified: Secondary | ICD-10-CM | POA: Diagnosis not present

## 2022-07-29 DIAGNOSIS — N1831 Chronic kidney disease, stage 3a: Secondary | ICD-10-CM | POA: Diagnosis not present

## 2022-07-29 DIAGNOSIS — H409 Unspecified glaucoma: Secondary | ICD-10-CM | POA: Diagnosis not present

## 2022-07-29 DIAGNOSIS — Z792 Long term (current) use of antibiotics: Secondary | ICD-10-CM | POA: Diagnosis not present

## 2022-07-29 DIAGNOSIS — Z9181 History of falling: Secondary | ICD-10-CM | POA: Diagnosis not present

## 2022-07-29 DIAGNOSIS — I129 Hypertensive chronic kidney disease with stage 1 through stage 4 chronic kidney disease, or unspecified chronic kidney disease: Secondary | ICD-10-CM | POA: Diagnosis not present

## 2022-07-29 DIAGNOSIS — E860 Dehydration: Secondary | ICD-10-CM | POA: Diagnosis not present

## 2022-07-29 DIAGNOSIS — H9190 Unspecified hearing loss, unspecified ear: Secondary | ICD-10-CM | POA: Diagnosis not present

## 2022-07-29 DIAGNOSIS — F0390 Unspecified dementia without behavioral disturbance: Secondary | ICD-10-CM | POA: Diagnosis not present

## 2022-08-10 NOTE — Progress Notes (Deleted)
08/10/2022 Meredith Owens TO:[id number] [birth year]  Referring provider: Azzie Glatter, FNP Primary GI doctor: Dr. Tarri Glenn (Dr. Ardis Hughs)  ASSESSMENT AND PLAN:  Unintentional weight loss Unremarkable labs, normal thyroid, no IDA.  Was set up for colon/EGd but could not tolerate the prep and they do not want to proceed with colonoscopy.  We will proceed with EGD and get CT Ab and pelvis, patient and daughter understand that this is not as good as a colonoscopy but I think it is good substitute at this time.  Given information about food to eat for weight gain Possible from lack of teeth/broken teeth/depression.   Hx of colonic polyps No IDA, colon 2017 1 SS Unable to tolerate prep, declines trying again at this time. Will proceed with CT AB and pelvis with contrast to evaluate further, can consider in the future pending results. .  Nausea without vomiting With weight loss Will proceed with CT AB and pelvis and EGD Can consider GES     History of Present Illness:  82 y.o. female  with a past medical history of hypertension, hyperlipidemia, prediabetes, history of adenomatous polyps and others listed below, returns to clinic today for evaluation of weight loss.  2014 colonoscopy due to history of precancerous colon polyps Dr. Ardis Hughs 5 subcentimeter adenomatous polyp. 04/2016 colonoscopy with 2 subcentimeter polyps removed 1 hyperplastic the other sessile serrated left-sided diverticulosis. 11/04/2021 office visit with Dr. Ardis Hughs to discuss colonoscopy, was complaining of mild chronic constipation and unintentional weight loss.   Scheduled for EGD and colonoscopy due to unintentional weight loss but she could not tolerate the prep.  06/07/2022 office visit with myself for unintentional weight loss, had previously been scheduled for EGD and colonoscopy but could not tolerate the prep, agreed to proceed with CT abdomen pelvis with contrast and EGD. 06/17/2022 CT abdomen pelvis with  contrast showed no acute findings abdomen or pelvis 07/05/2022 EGD with Dr. Tarri Glenn showed normal esophagus, nonbleeding gastric ulcer with no stigmata of bleeding.  Erythematous duodenopathy.  Started on pantoprazole 40 mg twice daily for at least 10 weeks and repeat EGD. EGD showed H. pylori gastritis, treated with Pylera for 10 days  1216/23 recent admission for generalized weakness, progressive dementia, hypotension and hyponatremia.  Given IV fluids and her valsartan/HCTZ was discontinued.   She is on miralax for constipation, when she takes it will help. She is not eating well.  She has gets full quickly, has some nausea, no vomit.  She does not like milk so will not drink ensure/boost. She has dental issues right now, she is not chewing or eating well.  Some dark stool occ, but no hematochezia.  They are willing to proceed with the endoscopy but does not want the colonoscopy at this time.  With recent labs she had normal thyroid and normal iron.    Wt Readings from Last 10 Encounters:  07/05/22 99 lb (44.9 kg)  06/07/22 99 lb 6 oz (45.1 kg)  04/22/22 101 lb (45.8 kg)  03/19/22 103 lb 6.4 oz (46.9 kg)  01/11/22 109 lb 4 oz (49.6 kg)  12/10/21 111 lb 6 oz (50.5 kg)  11/04/21 112 lb (50.8 kg)  09/29/21 115 lb 6.4 oz (52.3 kg)  08/24/21 113 lb 9.6 oz (51.5 kg)  07/20/21 119 lb (54 kg)     She  reports that she has never smoked. She has never used smokeless tobacco. She reports current alcohol use. She reports that she does not use drugs. Her family history includes  Cancer in her mother; Diabetes in her daughter, son, and son.   Current Medications:     Current Outpatient Medications (Cardiovascular):    carvedilol (COREG) 12.5 MG tablet, TAKE 1 TABLET BY MOUTH TWICE A DAY (Patient taking differently: Take 12.5 mg by mouth 2 (two) times daily.)   hydrALAZINE (APRESOLINE) 10 MG tablet, Take 1 tablet (10 mg total) by mouth 3 (three) times daily.   rosuvastatin (CRESTOR) 10 MG  tablet, Take 1 tablet (10 mg total) by mouth every other day.     Current Outpatient Medications (Analgesics):    aspirin 81 MG tablet, Take 81 mg by mouth daily.     Current Outpatient Medications (Other):    amoxicillin (AMOXIL) 500 MG capsule, Take 1,000 mg by mouth 2 (two) times daily. For 14 days   Bismuth/Metronidaz/Tetracyclin 140-125-125 MG CAPS, TAKE 3 CAPSULES BY MOUTH 4 (FOUR) TIMES DAILY - BEFORE MEALS AND AT BEDTIME FOR 10 DAYS.   diclofenac sodium (VOLTAREN) 1 % GEL, Apply 4 g topically 4 (four) times daily. (Patient taking differently: Apply 4 g topically 4 (four) times daily as needed (joint pain).)   donepezil (ARICEPT ODT) 5 MG disintegrating tablet, Take 2 tablets (10 mg total) by mouth at bedtime. (Patient not taking: Reported on 07/26/2022)   dorzolamide-timolol (COSOPT) 22.3-6.8 MG/ML ophthalmic solution, Place 1 drop into the left eye 2 (two) times daily.   memantine (NAMENDA) 10 MG tablet, TAKE 1 TABLET BY MOUTH TWICE A DAY   pantoprazole (PROTONIX) 40 MG tablet, Take 1 tablet (40 mg total) by mouth 2 (two) times daily. (Patient not taking: Reported on 07/26/2022)   ROCKLATAN 0.02-0.005 % SOLN, Place 1 drop into the left eye at bedtime.  Current Facility-Administered Medications (Other):    0.9 %  sodium chloride infusion  Surgical History:  She  has a past surgical history that includes Left eye surgery for cataracts (2010); Hysterectomy and removal of 1 ovary; Right eye removed (1960s); Carpal tunnel surgery (left hand); Abdominal hysterectomy; Bladder surgery; and Colon surgery.  Current Medications, Allergies, Past Medical History, Past Surgical History, Family History and Social History were reviewed in [employer] record.  Physical Exam: There were no vitals taken for this visit. General:  Thin appearing female in no acute distress Heart : Regular rate and rhythm; no murmurs Pulm: Clear anteriorly; no wheezing Abdomen:  Soft, Flat  AB, Sluggish bowel sounds. No tenderness . , No organomegaly appreciated. Rectal: Not evaluated Extremities:  without  edema. Neurologic:  Alert and  oriented x4;  No focal deficits.  Psych:  Cooperative. Normal mood and affect.   Vladimir Crofts, PA-C 08/10/22

## 2022-08-12 DIAGNOSIS — F0390 Unspecified dementia without behavioral disturbance: Secondary | ICD-10-CM | POA: Diagnosis not present

## 2022-08-12 DIAGNOSIS — Z792 Long term (current) use of antibiotics: Secondary | ICD-10-CM | POA: Diagnosis not present

## 2022-08-12 DIAGNOSIS — K2970 Gastritis, unspecified, without bleeding: Secondary | ICD-10-CM | POA: Diagnosis not present

## 2022-08-12 DIAGNOSIS — Z9181 History of falling: Secondary | ICD-10-CM | POA: Diagnosis not present

## 2022-08-12 DIAGNOSIS — H9190 Unspecified hearing loss, unspecified ear: Secondary | ICD-10-CM | POA: Diagnosis not present

## 2022-08-12 DIAGNOSIS — B9681 Helicobacter pylori [H. pylori] as the cause of diseases classified elsewhere: Secondary | ICD-10-CM | POA: Diagnosis not present

## 2022-08-12 DIAGNOSIS — E860 Dehydration: Secondary | ICD-10-CM | POA: Diagnosis not present

## 2022-08-12 DIAGNOSIS — H409 Unspecified glaucoma: Secondary | ICD-10-CM | POA: Diagnosis not present

## 2022-08-12 DIAGNOSIS — N1831 Chronic kidney disease, stage 3a: Secondary | ICD-10-CM | POA: Diagnosis not present

## 2022-08-12 DIAGNOSIS — E871 Hypo-osmolality and hyponatremia: Secondary | ICD-10-CM | POA: Diagnosis not present

## 2022-08-12 DIAGNOSIS — E785 Hyperlipidemia, unspecified: Secondary | ICD-10-CM | POA: Diagnosis not present

## 2022-08-12 DIAGNOSIS — I129 Hypertensive chronic kidney disease with stage 1 through stage 4 chronic kidney disease, or unspecified chronic kidney disease: Secondary | ICD-10-CM | POA: Diagnosis not present

## 2022-08-12 DIAGNOSIS — N179 Acute kidney failure, unspecified: Secondary | ICD-10-CM | POA: Diagnosis not present

## 2022-08-12 DIAGNOSIS — Z7982 Long term (current) use of aspirin: Secondary | ICD-10-CM | POA: Diagnosis not present

## 2022-08-18 ENCOUNTER — Ambulatory Visit: Payer: PPO | Admitting: Physician Assistant

## 2022-08-19 ENCOUNTER — Other Ambulatory Visit: Payer: PPO

## 2022-08-23 ENCOUNTER — Other Ambulatory Visit: Payer: PPO

## 2022-08-24 DIAGNOSIS — N1831 Chronic kidney disease, stage 3a: Secondary | ICD-10-CM | POA: Diagnosis not present

## 2022-08-24 DIAGNOSIS — E860 Dehydration: Secondary | ICD-10-CM | POA: Diagnosis not present

## 2022-08-24 DIAGNOSIS — Z792 Long term (current) use of antibiotics: Secondary | ICD-10-CM | POA: Diagnosis not present

## 2022-08-24 DIAGNOSIS — E785 Hyperlipidemia, unspecified: Secondary | ICD-10-CM | POA: Diagnosis not present

## 2022-08-24 DIAGNOSIS — N179 Acute kidney failure, unspecified: Secondary | ICD-10-CM | POA: Diagnosis not present

## 2022-08-24 DIAGNOSIS — E871 Hypo-osmolality and hyponatremia: Secondary | ICD-10-CM | POA: Diagnosis not present

## 2022-08-24 DIAGNOSIS — Z7982 Long term (current) use of aspirin: Secondary | ICD-10-CM | POA: Diagnosis not present

## 2022-08-24 DIAGNOSIS — Z9181 History of falling: Secondary | ICD-10-CM | POA: Diagnosis not present

## 2022-08-24 DIAGNOSIS — F0390 Unspecified dementia without behavioral disturbance: Secondary | ICD-10-CM | POA: Diagnosis not present

## 2022-08-24 DIAGNOSIS — B9681 Helicobacter pylori [H. pylori] as the cause of diseases classified elsewhere: Secondary | ICD-10-CM | POA: Diagnosis not present

## 2022-08-24 DIAGNOSIS — K2970 Gastritis, unspecified, without bleeding: Secondary | ICD-10-CM | POA: Diagnosis not present

## 2022-08-24 DIAGNOSIS — H9190 Unspecified hearing loss, unspecified ear: Secondary | ICD-10-CM | POA: Diagnosis not present

## 2022-08-24 DIAGNOSIS — I129 Hypertensive chronic kidney disease with stage 1 through stage 4 chronic kidney disease, or unspecified chronic kidney disease: Secondary | ICD-10-CM | POA: Diagnosis not present

## 2022-08-24 DIAGNOSIS — H409 Unspecified glaucoma: Secondary | ICD-10-CM | POA: Diagnosis not present

## 2022-09-08 DIAGNOSIS — H02422 Myogenic ptosis of left eyelid: Secondary | ICD-10-CM | POA: Diagnosis not present

## 2022-09-08 DIAGNOSIS — H43812 Vitreous degeneration, left eye: Secondary | ICD-10-CM | POA: Diagnosis not present

## 2022-09-08 DIAGNOSIS — Z961 Presence of intraocular lens: Secondary | ICD-10-CM | POA: Diagnosis not present

## 2022-09-08 DIAGNOSIS — Z970 Presence of artificial eye: Secondary | ICD-10-CM | POA: Diagnosis not present

## 2022-09-08 DIAGNOSIS — H401222 Low-tension glaucoma, left eye, moderate stage: Secondary | ICD-10-CM | POA: Diagnosis not present

## 2022-09-14 ENCOUNTER — Encounter: Payer: Self-pay | Admitting: Gastroenterology

## 2022-09-14 ENCOUNTER — Ambulatory Visit (AMBULATORY_SURGERY_CENTER): Payer: PPO | Admitting: Gastroenterology

## 2022-09-14 VITALS — BP 184/79 | HR 64 | Temp 98.2°F | Resp 13 | Ht 61.75 in | Wt 90.6 lb

## 2022-09-14 DIAGNOSIS — N189 Chronic kidney disease, unspecified: Secondary | ICD-10-CM | POA: Diagnosis not present

## 2022-09-14 DIAGNOSIS — B9681 Helicobacter pylori [H. pylori] as the cause of diseases classified elsewhere: Secondary | ICD-10-CM | POA: Diagnosis not present

## 2022-09-14 DIAGNOSIS — R634 Abnormal weight loss: Secondary | ICD-10-CM | POA: Diagnosis not present

## 2022-09-14 DIAGNOSIS — G309 Alzheimer's disease, unspecified: Secondary | ICD-10-CM | POA: Diagnosis not present

## 2022-09-14 DIAGNOSIS — K2970 Gastritis, unspecified, without bleeding: Secondary | ICD-10-CM

## 2022-09-14 DIAGNOSIS — F32A Depression, unspecified: Secondary | ICD-10-CM | POA: Diagnosis not present

## 2022-09-14 DIAGNOSIS — K2950 Unspecified chronic gastritis without bleeding: Secondary | ICD-10-CM | POA: Diagnosis not present

## 2022-09-14 MED ORDER — SODIUM CHLORIDE 0.9 % IV SOLN: 500.0000 mL | Freq: Once | INTRAVENOUS | Status: AC

## 2022-09-14 NOTE — Progress Notes (Signed)
Called to room to assist during endoscopic procedure.  Patient ID and intended procedure confirmed with present staff. Received instructions for my participation in the procedure from the performing physician.  

## 2022-09-14 NOTE — Progress Notes (Signed)
A and O x3. Report to RN. Tolerated MAC anesthesia well.Teeth unchanged after procedure. 
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
## 2022-09-14 NOTE — Patient Instructions (Signed)
Discharge instructions given. Biopsies taken. No aspirin,ibuprofen,naproxen,or other non-steroidal anti-inflammatory drugs. Family will call back to schedule a follow up appointment with Dr. Tarri Glenn FOR 4-6 WEEKS. Resume previous medications. YOU HAD AN ENDOSCOPIC PROCEDURE TODAY AT [facility]:   Refer to the procedure report that was given to you for any specific questions about what was found during the examination.  If the procedure report does not answer your questions, please call your gastroenterologist to clarify.  If you requested that your care partner not be given the details of your procedure findings, then the procedure report has been included in a sealed envelope for you to review at your convenience later.  YOU SHOULD EXPECT: Some feelings of bloating in the abdomen. Passage of more gas than usual.  Walking can help get rid of the air that was put into your GI tract during the procedure and reduce the bloating. If you had a lower endoscopy (such as a colonoscopy or flexible sigmoidoscopy) you may notice spotting of blood in your stool or on the toilet paper. If you underwent a bowel prep for your procedure, you may not have a normal bowel movement for a few days.  Please Note:  You might notice some irritation and congestion in your nose or some drainage.  This is from the oxygen used during your procedure.  There is no need for concern and it should clear up in a day or so.  SYMPTOMS TO REPORT IMMEDIATELY:   Following upper endoscopy (EGD)  Vomiting of blood or coffee ground material  New chest pain or pain under the shoulder blades  Painful or persistently difficult swallowing  New shortness of breath  Fever of 100F or higher  Black, tarry-looking stools  For urgent or emergent issues, a gastroenterologist can be reached at any hour by calling [phone]. Do not use MyChart messaging for urgent concerns.    DIET:  We do recommend a small meal at  first, but then you may proceed to your regular diet.  Drink plenty of fluids but you should avoid alcoholic beverages for 24 hours.  ACTIVITY:  You should plan to take it easy for the rest of today and you should NOT DRIVE or use heavy machinery until tomorrow (because of the sedation medicines used during the test).    FOLLOW UP: Our staff will call the number listed on your records the next business day following your procedure.  We will call around 7:15- 8:00 am to check on you and address any questions or concerns that you may have regarding the information given to you following your procedure. If we do not reach you, we will leave a message.     If any biopsies were taken you will be contacted by phone or by letter within the next 1-3 weeks.  Please call us at [phone] if you have not heard about the biopsies in 3 weeks.    SIGNATURES/CONFIDENTIALITY: You and/or your care partner have signed paperwork which will be entered into your electronic medical record.  These signatures attest to the fact that that the information above on your After Visit Summary has been reviewed and is understood.  Full responsibility of the confidentiality of this discharge information lies with you and/or your care-partner.

## 2022-09-14 NOTE — Progress Notes (Signed)
Vitals-DT  Pt's states no medical or surgical changes since previsit or office visit.  

## 2022-09-14 NOTE — Op Note (Signed)
[name] Patient Name: Meredith Owens Procedure Date: 09/14/2022 10:07 AM MRN: [id number] Endoscopist: Thornton Park MD, MD, [id number] Age: 82 Referring MD:  Date of Birth: 1941-07-16 Gender: Female Account #: [id number] Procedure:                Upper GI endoscopy Indications:              Follow-up of chronic gastric ulcers and erosions on                            EGD 06/2022                           Gastric biopsies showed H pylori Medicines:                Monitored Anesthesia Care Procedure:                Pre-Anesthesia Assessment:                           - Prior to the procedure, a History and Physical                            was performed, and patient medications and                            allergies were reviewed. The patient's tolerance of                            previous anesthesia was also reviewed. The risks                            and benefits of the procedure and the sedation                            options and risks were discussed with the patient.                            All questions were answered, and informed consent                            was obtained. Prior Anticoagulants: The patient has                            taken no anticoagulant or antiplatelet agents. ASA                            Grade Assessment: III - A patient with severe                            systemic disease. After reviewing the risks and                            benefits, the patient was deemed in satisfactory  condition to undergo the procedure.                           After obtaining informed consent, the endoscope was                            passed under direct vision. Throughout the                            procedure, the patient's blood pressure, pulse, and                            oxygen saturations were monitored continuously. The                            GIF [id number] #[id number] was introduced through the                             mouth, and advanced to the second part of duodenum.                            The upper GI endoscopy was accomplished without                            difficulty. The patient tolerated the procedure                            well. Scope In: Scope Out: Findings:                 The esophagus was normal.                           A few small erosions with no bleeding and no                            stigmata of recent bleeding were found in the                            gastric antrum. Biopsies were taken from the                            antrum, body, and fundus with a cold forceps for                            histology. Estimated blood loss was minimal.                           The examined duodenum was normal. Complications:            No immediate complications. Estimated Blood Loss:     Estimated blood loss was minimal. Impression:               - Normal esophagus.                           -  Erosive gastropathy with no bleeding and no                            stigmata of recent bleeding. Biopsied.                           - Normal examined duodenum. Recommendation:           - Patient has a contact number available for                            emergencies. The signs and symptoms of potential                            delayed complications were discussed with the                            patient. Return to normal activities tomorrow.                            Written discharge instructions were provided to the                            patient.                           - Resume previous diet.                           - Continue present medications including                            pantoprazole 40 mg BID.                           - No aspirin, ibuprofen, naproxen, or other                            non-steroidal anti-inflammatory drugs.                           - Await pathology results.                           - Office follow-up  in 4-6 weeks with Dr. Tarri Glenn or                            Estill Bamberg, earlier with new symptoms. Thornton Park MD, MD 09/14/2022 10:27:39 AM This report has been signed electronically.

## 2022-09-15 ENCOUNTER — Telehealth: Payer: Self-pay

## 2022-09-15 NOTE — Telephone Encounter (Signed)
Left message on follow up call. 

## 2022-09-23 DIAGNOSIS — R531 Weakness: Secondary | ICD-10-CM | POA: Diagnosis not present

## 2022-09-23 DIAGNOSIS — E871 Hypo-osmolality and hyponatremia: Secondary | ICD-10-CM | POA: Diagnosis not present

## 2022-09-23 DIAGNOSIS — A048 Other specified bacterial intestinal infections: Secondary | ICD-10-CM | POA: Diagnosis not present

## 2022-09-29 ENCOUNTER — Other Ambulatory Visit (INDEPENDENT_AMBULATORY_CARE_PROVIDER_SITE_OTHER): Payer: PPO

## 2022-09-29 ENCOUNTER — Encounter: Payer: Self-pay | Admitting: Nurse Practitioner

## 2022-09-29 ENCOUNTER — Ambulatory Visit (INDEPENDENT_AMBULATORY_CARE_PROVIDER_SITE_OTHER): Payer: PPO | Admitting: Nurse Practitioner

## 2022-09-29 VITALS — BP 150/90 | HR 88 | Ht 61.75 in | Wt 86.1 lb

## 2022-09-29 DIAGNOSIS — K5900 Constipation, unspecified: Secondary | ICD-10-CM

## 2022-09-29 DIAGNOSIS — R197 Diarrhea, unspecified: Secondary | ICD-10-CM | POA: Diagnosis not present

## 2022-09-29 LAB — CBC WITH DIFFERENTIAL/PLATELET
Basophils Absolute: 0 10*3/uL (ref 0.0–0.1)
Basophils Relative: 0.3 % (ref 0.0–3.0)
Eosinophils Absolute: 0.1 10*3/uL (ref 0.0–0.7)
Eosinophils Relative: 0.6 % (ref 0.0–5.0)
HCT: 37.4 % (ref 36.0–46.0)
Hemoglobin: 12.4 g/dL (ref 12.0–15.0)
Lymphocytes Relative: 6.7 % — ABNORMAL LOW (ref 12.0–46.0)
Lymphs Abs: 0.8 10*3/uL (ref 0.7–4.0)
MCHC: 33 g/dL (ref 30.0–36.0)
MCV: 97 fl (ref 78.0–100.0)
Monocytes Absolute: 0.7 10*3/uL (ref 0.1–1.0)
Monocytes Relative: 5.6 % (ref 3.0–12.0)
Neutro Abs: 11 10*3/uL — ABNORMAL HIGH (ref 1.4–7.7)
Neutrophils Relative %: 86.8 % — ABNORMAL HIGH (ref 43.0–77.0)
Platelets: 297 10*3/uL (ref 150.0–400.0)
RBC: 3.86 Mil/uL — ABNORMAL LOW (ref 3.87–5.11)
RDW: 14.9 % (ref 11.5–15.5)
WBC: 12.7 10*3/uL — ABNORMAL HIGH (ref 4.0–10.5)

## 2022-09-29 LAB — BASIC METABOLIC PANEL
BUN: 19 mg/dL (ref 6–23)
CO2: 22 mEq/L (ref 19–32)
Calcium: 11.1 mg/dL — ABNORMAL HIGH (ref 8.4–10.5)
Chloride: 108 mEq/L (ref 96–112)
Creatinine, Ser: 1.12 mg/dL (ref 0.40–1.20)
GFR: 46.29 mL/min — ABNORMAL LOW (ref >60.00)
Glucose, Bld: 81 mg/dL (ref 70–99)
Potassium: 4 mEq/L (ref 3.5–5.1)
Sodium: 143 mEq/L (ref 135–145)

## 2022-09-29 NOTE — Progress Notes (Unsigned)
     09/29/2022 MICHILLE EAGLES [id number] [birth year]   Chief Complaint: Diarrhea   History of Present Illness: Meredith Owens is an 82 year old female with a past medical history of depression, arthritis, Alzheimer's disease, hypertension, hyperlipidemia, CKD, osteoporosis. She is known by Dr. Tarri Glenn.       Pt daughter [name]  made aware of recent results and Dr. Tarri Glenn recommendations. [name] stated that they dropped the sample off on jan 15th. Lab contacted and stated that they do not have the results. [facility] contacted and requested faxed results. Results received and H Pylori was negative. Copies made. Placed on Dr. Modena Nunnery and [name] Best NP desk and copy sent to be scanned into epic. [name] stated that she was planning on calling our office today to schedule an office visit for her mom who has been having diarrhea for a week now and concerned that she is getting dehydrated. Pt scheduled for an office visit for tomorrow 09/29/2022 at 11:30 with [name] Best NP: [name] made aware. [name] verbalized understanding with all questions answered.  Constipated, daughter gave laxative on Tuesday 2/13.  She took Miralax loose stools Miralax. Sat felt better. Couldn't control it. Oozing out. Dribbles out. Soiled at night. Lower abdomen on and off.   Antibiotics for H. Pylori.   3 or 4 days without a BM. Tues   EGD 09/14/2022:  Surgical [P], fundus, gastric antrum, and gastric body - ANTRAL AND OXYNTIC MUCOSA WITH MILD TO MODERATE CHRONIC GASTRITIS. NOTE: WHILE AN IMMUNOHISTOCHEMICAL STAIN IS NEGATIVE FOR HELICOBACTER PYLORI ORGANISMS, THE OVERALL HISTOLOGIC APPEARANCE IS STILL CONCERNING FOR POSSIBLE UNDERLYING HELICOBACTER PYLORI INFECTION, CLINICAL CORRELATION WITH ADDITIONAL TESTING AS CLINICALLY INDICATED.  Biopsy results show chronic gastritis. There are some features that suggest H pylori but the infection is not actually seen. Given these indeterminate results, please  arrange for a [facility] H pylori test this week - then the results will be available prior to her upcoming office visit. Thanks.  Current Medications, Allergies, Past Medical History, Past Surgical History, Family History and Social History were reviewed in [employer] record.   Review of Systems:   Constitutional: Negative for fever, sweats, chills or weight loss.  Respiratory: Negative for shortness of breath.   Cardiovascular: Negative for chest pain, palpitations and leg swelling.  Gastrointestinal: See HPI.  Musculoskeletal: Negative for back pain or muscle aches.  Neurological: Negative for dizziness, headaches or paresthesias.    Physical Exam: There were no vitals taken for this visit. General: in no acute distress. Head: Normocephalic and atraumatic. Eyes: No scleral icterus. Conjunctiva pink . Ears: Normal auditory acuity. Mouth: Dentition intact. No ulcers or lesions.  Lungs: Clear throughout to auscultation. Heart: Regular rate and rhythm, no murmur. Abdomen: Soft, nontender and nondistended. No masses or hepatomegaly. Normal bowel sounds x 4 quadrants.  Rectal: No stool impaction. No blood. No hemorrhoids. Daughter at bedside at time of exam.  Musculoskeletal: Symmetrical with no gross deformities. Extremities: No edema. Neurological: Alert oriented x 4. No focal deficits.  Psychological: Alert and cooperative. Normal mood and affect  Assessment and Recommendations: ***

## 2022-09-29 NOTE — Patient Instructions (Addendum)
Your provider has requested that you have an abdominal x ray before leaving today. Please go to the basement floor to our Radiology department for the test.   Your provider has requested that you go to the basement level for lab work before leaving today. Press "B" on the elevator. The lab is located at the first door on the left as you exit the elevator.  Due to recent changes in healthcare laws, you may see the results of your imaging and laboratory studies on MyChart before your provider has had a chance to review them.  We understand that in some cases there may be results that are confusing or concerning to you. Not all laboratory results come back in the same time frame and the provider may be waiting for multiple results in order to interpret others.  Please give [place] 48 hours in order for your provider to thoroughly review all the results before contacting the office for clarification of your results.    Thank you for trusting me with your gastrointestinal care!   [name] Best, CRNP

## 2022-09-30 ENCOUNTER — Emergency Department (HOSPITAL_COMMUNITY)
Admission: EM | Admit: 2022-09-30 | Discharge: 2022-10-01 | Disposition: A | Discharge status: Home / Self Care | Payer: PPO | Attending: Emergency Medicine | Admitting: Emergency Medicine

## 2022-09-30 ENCOUNTER — Emergency Department (HOSPITAL_COMMUNITY): Payer: PPO

## 2022-09-30 ENCOUNTER — Encounter (HOSPITAL_COMMUNITY): Payer: Self-pay

## 2022-09-30 ENCOUNTER — Other Ambulatory Visit: Payer: Self-pay

## 2022-09-30 DIAGNOSIS — Z7982 Long term (current) use of aspirin: Secondary | ICD-10-CM | POA: Diagnosis not present

## 2022-09-30 DIAGNOSIS — Z91040 Latex allergy status: Secondary | ICD-10-CM | POA: Diagnosis not present

## 2022-09-30 DIAGNOSIS — D649 Anemia, unspecified: Secondary | ICD-10-CM | POA: Insufficient documentation

## 2022-09-30 DIAGNOSIS — Z79899 Other long term (current) drug therapy: Secondary | ICD-10-CM | POA: Insufficient documentation

## 2022-09-30 DIAGNOSIS — I10 Essential (primary) hypertension: Secondary | ICD-10-CM | POA: Insufficient documentation

## 2022-09-30 DIAGNOSIS — K5100 Ulcerative (chronic) pancolitis without complications: Secondary | ICD-10-CM | POA: Diagnosis not present

## 2022-09-30 DIAGNOSIS — K7689 Other specified diseases of liver: Secondary | ICD-10-CM | POA: Diagnosis not present

## 2022-09-30 DIAGNOSIS — E119 Type 2 diabetes mellitus without complications: Secondary | ICD-10-CM | POA: Diagnosis not present

## 2022-09-30 DIAGNOSIS — K529 Noninfective gastroenteritis and colitis, unspecified: Secondary | ICD-10-CM | POA: Diagnosis not present

## 2022-09-30 DIAGNOSIS — R197 Diarrhea, unspecified: Secondary | ICD-10-CM | POA: Diagnosis present

## 2022-09-30 HISTORY — DX: Other specified bacterial intestinal infections: A04.8

## 2022-09-30 LAB — I-STAT CHEM 8, ED
BUN: 26 mg/dL — ABNORMAL HIGH (ref 8–23)
Calcium, Ion: 1.39 mmol/L (ref 1.15–1.40)
Chloride: 107 mmol/L (ref 98–111)
Creatinine, Ser: 1.5 mg/dL — ABNORMAL HIGH (ref 0.44–1.00)
Glucose, Bld: 79 mg/dL (ref 70–99)
HCT: 35 % — ABNORMAL LOW (ref 36.0–46.0)
Hemoglobin: 11.9 g/dL — ABNORMAL LOW (ref 12.0–15.0)
Potassium: 4.2 mmol/L (ref 3.5–5.1)
Sodium: 138 mmol/L (ref 135–145)
TCO2: 20 mmol/L — ABNORMAL LOW (ref 22–32)

## 2022-09-30 LAB — COMPREHENSIVE METABOLIC PANEL
ALT: 17 U/L (ref 0–44)
AST: 22 U/L (ref 15–41)
Albumin: 3.8 g/dL (ref 3.5–5.0)
Alkaline Phosphatase: 49 U/L (ref 38–126)
Anion gap: 12 (ref 5–15)
BUN: 24 mg/dL — ABNORMAL HIGH (ref 8–23)
CO2: 19 mmol/L — ABNORMAL LOW (ref 22–32)
Calcium: 10.3 mg/dL (ref 8.9–10.3)
Chloride: 105 mmol/L (ref 98–111)
Creatinine, Ser: 1.44 mg/dL — ABNORMAL HIGH (ref 0.44–1.00)
GFR, Estimated: 37 mL/min — ABNORMAL LOW (ref >60)
Glucose, Bld: 82 mg/dL (ref 70–99)
Potassium: 4.2 mmol/L (ref 3.5–5.1)
Sodium: 136 mmol/L (ref 135–145)
Total Bilirubin: 0.7 mg/dL (ref 0.3–1.2)
Total Protein: 7 g/dL (ref 6.5–8.1)

## 2022-09-30 LAB — CBC
HCT: 35.9 % — ABNORMAL LOW (ref 36.0–46.0)
Hemoglobin: 11.6 g/dL — ABNORMAL LOW (ref 12.0–15.0)
MCH: 31.8 pg (ref 26.0–34.0)
MCHC: 32.3 g/dL (ref 30.0–36.0)
MCV: 98.4 fL (ref 80.0–100.0)
Platelets: 299 10*3/uL (ref 150–400)
RBC: 3.65 MIL/uL — ABNORMAL LOW (ref 3.87–5.11)
RDW: 14.6 % (ref 11.5–15.5)
WBC: 10.3 10*3/uL (ref 4.0–10.5)
nRBC: 0 % (ref 0.0–0.2)

## 2022-09-30 LAB — LIPASE, BLOOD: Lipase: 58 U/L — ABNORMAL HIGH (ref 11–51)

## 2022-09-30 LAB — MAGNESIUM: Magnesium: 1.8 mg/dL (ref 1.7–2.4)

## 2022-09-30 MED ORDER — IOHEXOL 350 MG/ML SOLN
55.0000 mL | Freq: Once | INTRAVENOUS | Status: AC | PRN
  Administered 2022-09-30: 55 mL via INTRAVENOUS

## 2022-09-30 MED ORDER — SODIUM CHLORIDE 0.9 % IV BOLUS
1000.0000 mL | Freq: Once | INTRAVENOUS | Status: AC
  Administered 2022-09-30: 1000 mL via INTRAVENOUS

## 2022-09-30 NOTE — ED Triage Notes (Signed)
Pt c/o abdominal pain, diarrhea and losing wtx1wk. Pt told daughter she felt constipated and wanted a laxative Monday then Tuesday started having loose stools and incontinent of loose stoolsx3d. Pt was given miralax. Pt just got tx for H. Pylori.

## 2022-09-30 NOTE — ED Notes (Signed)
Patient placed in ER 15, assumed care of patient at this time.

## 2022-09-30 NOTE — ED Notes (Signed)
Patient provided with er meal and beverage.

## 2022-09-30 NOTE — ED Provider Triage Note (Signed)
Emergency Medicine Provider Triage Evaluation Note  Meredith Owens , a 82 y.o. female  was evaluated in triage.  Pt complains of abdominal pain, and diarrhea.  Recently completed H. pylori treatment few weeks ago.  States her stools are watery and has innumerable episodes per day.  Denies fever, chills, chest pain.  Review of Systems  Positive: As above Negative: As above  Physical Exam  BP (!) 158/67   Pulse 66   Temp 98.2 F (36.8 C) (Oral)   Resp 16   Ht 5' 1.75" (1.568 m)   Wt 39.1 kg   SpO2 99%   BMI 15.89 kg/m  Gen:   Awake, no distress   Resp:  Normal effort  MSK:   Moves extremities without difficulty  Other:    Medical Decision Making  Medically screening exam initiated at 5:45 PM.  Appropriate orders placed.  [name] was informed that the remainder of the evaluation will be completed by another provider, this initial triage assessment does not replace that evaluation, and the importance of remaining in the ED until their evaluation is complete.    Evlyn Courier, PA-C 09/30/22 1747

## 2022-09-30 NOTE — ED Provider Notes (Signed)
[facility] Provider Note   CSN: [id number] Arrival date & time: 09/30/22  1725     History  Chief Complaint  Patient presents with   Abdominal Pain   Diarrhea    Meredith Owens is a 82 y.o. female.  HPI   Patient with medical history including hypertension, diabetes, history of H. pylori, has presented with complaints of multiple episodes of diarrhea.  Patient states that she has been having diarrhea since Tuesday, states that she initially was constipated so she took MiraLAX on Monday Tuesday and then had worsening diarrhea.  She states that it sounded just liquid, she denies any bloody stools or coffee-ground stools, she has had some nausea any vomiting, she has some slight epigastric tenderness, but no other abdominal pain, she states she has felt cold but has no actual fever, she denies any cough congestion general body aches.  She has had a hysterectomy performed but no other significant abdominal surgeries.  No recent antibiotic use, no history of C. difficile.  Reviewed patient's chart is followed by GI, had a upper endoscopy last year in November confirmed H. pylori, was started on antibiotics, she finished them on January, repeat colonoscopy was indeterminant for evidence of H. pylori, stool sample was collected which was negative.  Home Medications Prior to Admission medications   Medication Sig Start Date End Date Taking? Authorizing Provider  aspirin 81 MG tablet Take 81 mg by mouth daily. 01/17/12   Oh Park, Samuel Germany, MD  carvedilol (COREG) 12.5 MG tablet TAKE 1 TABLET BY MOUTH TWICE A DAY Patient taking differently: Take 12.5 mg by mouth 2 (two) times daily. 10/12/21   Gladys Damme, MD  diclofenac sodium (VOLTAREN) 1 % GEL Apply 4 g topically 4 (four) times daily. Patient taking differently: Apply 4 g topically 4 (four) times daily as needed (joint pain). 05/01/19   Shirley, [place], DO  donepezil (ARICEPT ODT) 5 MG  disintegrating tablet Take 2 tablets (10 mg total) by mouth at bedtime. 04/22/22 04/17/23  Alric Ran, MD  dorzolamide-timolol (COSOPT) 22.3-6.8 MG/ML ophthalmic solution Place 1 drop into the left eye 2 (two) times daily.    [provider]  hydrALAZINE (APRESOLINE) 10 MG tablet Take 1 tablet (10 mg total) by mouth 3 (three) times daily. 03/19/22   Marylu Lund., NP  memantine (NAMENDA) 10 MG tablet TAKE 1 TABLET BY MOUTH TWICE A DAY 05/19/22   Alric Ran, MD  mirtazapine (REMERON) 7.5 MG tablet Take 1 tablet by mouth daily. 08/24/22 02/20/23  [provider]  pantoprazole (PROTONIX) 40 MG tablet Take 1 tablet (40 mg total) by mouth 2 (two) times daily. 07/05/22   Thornton Park, MD  potassium chloride (KLOR-CON) 10 MEQ tablet Take by mouth. 07/28/22   [provider]  ROCKLATAN 0.02-0.005 % SOLN Place 1 drop into the left eye at bedtime. 02/14/18   [provider]  rosuvastatin (CRESTOR) 10 MG tablet Take 1 tablet (10 mg total) by mouth every other day. 03/19/22   Marylu Lund., NP  valsartan-hydrochlorothiazide (DIOVAN-HCT) 80-12.5 MG tablet Take 1 tablet by mouth daily. 09/25/22   [provider]  Calcium Carbonate-Vitamin D 600-400 MG-UNIT tablet Take 2 tablets by mouth daily. 03/12/20 07/10/20  Simmons-Robinson, Riki Sheer, MD      Allergies    Brimonidine, Oxycodone hcl, Prochlorperazine edisylate, and Latex    Review of Systems   Review of Systems  Constitutional:  Negative for chills and fever.  Respiratory:  Negative for shortness of breath.   Cardiovascular:  Negative for chest pain.  Gastrointestinal:  Positive for abdominal pain, diarrhea and nausea. Negative for vomiting.  Neurological:  Negative for headaches.    Physical Exam Updated Vital Signs BP (!) 145/60   Pulse 65   Temp 97.9 F (36.6 C) (Oral)   Resp 16   Ht 5' 1.75" (1.568 m)   Wt 39.1 kg   SpO2 100%   BMI 15.89 kg/m  Physical Exam Vitals and nursing note  reviewed.  Constitutional:      General: She is not in acute distress.    Appearance: She is not ill-appearing.  HENT:     Head: Normocephalic and atraumatic.     Nose: No congestion.  Eyes:     Conjunctiva/sclera: Conjunctivae normal.  Cardiovascular:     Rate and Rhythm: Normal rate and regular rhythm.     Pulses: Normal pulses.     Heart sounds: No murmur heard.    No friction rub. No gallop.  Pulmonary:     Effort: No respiratory distress.     Breath sounds: No wheezing, rhonchi or rales.  Abdominal:     Palpations: Abdomen is soft.     Tenderness: There is no abdominal tenderness. There is no right CVA tenderness or left CVA tenderness.     Comments: Abdomen nondistended, soft, abdomen was nontender to palpation.  Skin:    General: Skin is warm and dry.  Neurological:     Mental Status: She is alert.  Psychiatric:        Mood and Affect: Mood normal.     ED Results / Procedures / Treatments   Labs (all labs ordered are listed, but only abnormal results are displayed) Labs Reviewed  LIPASE, BLOOD - Abnormal; Notable for the following components:      Result Value   Lipase 58 (*)    All other components within normal limits  COMPREHENSIVE METABOLIC PANEL - Abnormal; Notable for the following components:   CO2 19 (*)    BUN 24 (*)    Creatinine, Ser 1.44 (*)    GFR, Estimated 37 (*)    All other components within normal limits  CBC - Abnormal; Notable for the following components:   RBC 3.65 (*)    Hemoglobin 11.6 (*)    HCT 35.9 (*)    All other components within normal limits  URINALYSIS, ROUTINE W REFLEX MICROSCOPIC - Abnormal; Notable for the following components:   Specific Gravity, Urine 1.043 (*)    Hgb urine dipstick SMALL (*)    Ketones, ur 20 (*)    Leukocytes,Ua TRACE (*)    All other components within normal limits  I-STAT CHEM 8, ED - Abnormal; Notable for the following components:   BUN 26 (*)    Creatinine, Ser 1.50 (*)    TCO2 20 (*)     Hemoglobin 11.9 (*)    HCT 35.0 (*)    All other components within normal limits  C DIFFICILE QUICK SCREEN W PCR REFLEX    MAGNESIUM    EKG None  Radiology CT ABDOMEN PELVIS W CONTRAST  Result Date: 09/30/2022 CLINICAL DATA:  CT Abdomen/Pelvis with Contrast Patient states having abdominal pain with diarrhea Patient previously has finished treatment for H. Pylori infection few weeks ago ; Reduced dose per patient necessity EXAM: CT ABDOMEN AND PELVIS WITH CONTRAST TECHNIQUE: Multidetector CT imaging of the abdomen and pelvis was performed using the standard protocol following bolus  administration of intravenous contrast. RADIATION DOSE REDUCTION: This exam was performed according to the departmental dose-optimization program which includes automated exposure control, adjustment of the mA and/or kV according to patient size and/or use of iterative reconstruction technique. CONTRAST:  39m OMNIPAQUE IOHEXOL 350 MG/ML SOLN COMPARISON:  CT abdomen pelvis 06/17/2022 FINDINGS: Lower chest: No acute abnormality. Hepatobiliary: Subcentimeter hypodensity too small to characterize. Fluid density lesion within the right inferior hepatic lobe likely a simple hepatic cyst. No focal liver abnormality. No gallstones, gallbladder wall thickening, or pericholecystic fluid. No biliary dilatation. Pancreas: No focal lesion. Normal pancreatic contour. No surrounding inflammatory changes. No main pancreatic ductal dilatation. Spleen: Normal in size without focal abnormality. Adrenals/Urinary Tract: No adrenal nodule bilaterally. Bilateral kidneys enhance symmetrically. No hydronephrosis. No hydroureter. The urinary bladder is unremarkable. On delayed imaging, there is no urothelial wall thickening and there are no filling defects in the opacified portions of the bilateral collecting systems or ureters. Stomach/Bowel: Stomach is within normal limits. No evidence of small bowel wall thickening or dilatation. Limited evaluation  to due to decompressed bowel and lack of intraperitoneal fat. Bowel wall thickening and pericolonic fat stranding suggestive of colitis. Associated mesenteric edema. No pneumatosis. The appendix is not definitely identified with no inflammatory changes in the right lower quadrant to suggest acute appendicitis. Vascular/Lymphatic: No abdominal aorta or iliac aneurysm. Severe atherosclerotic plaque of the aorta and its branches. No abdominal, pelvic, or inguinal lymphadenopathy. Reproductive: Status post hysterectomy. No adnexal masses. Other: No intraperitoneal free fluid. No intraperitoneal free gas. No organized fluid collection. Musculoskeletal: No abdominal wall hernia or abnormality. No suspicious lytic or blastic osseous lesions. No acute displaced fracture. Grade 1 anterolisthesis of L4 on L5. IMPRESSION: 1. Markedly limited evaluation due to decompressed bowel and lack of intraperitoneal fat. 2. Pancolitis. No definite bowel perforation. No bowel obstruction. 3.  Aortic Atherosclerosis (ICD10-I70.0). Electronically Signed   By: MIven FinnM.D.   On: 09/30/2022 20:40    Procedures Procedures    Medications Ordered in ED Medications  iohexol (OMNIPAQUE) 350 MG/ML injection 55 mL (55 mLs Intravenous Contrast Given 09/30/22 1959)  sodium chloride 0.9 % bolus 1,000 mL (0 mLs Intravenous Stopped 10/01/22 0001)    ED Course/ Medical Decision Making/ A&P                             Medical Decision Making Amount and/or Complexity of Data Reviewed Labs: ordered.   This patient presents to the ED for concern of diarrhea, this involves an extensive number of treatment options, and is a complaint that carries with it a high risk of complications and morbidity.  The differential diagnosis includes diverticulitis, bowel obstruction, Crohn's disease, UC,    Additional history obtained:  Additional history obtained from daughter at bedside External records from outside source obtained and  reviewed including gastroenterology notes   Co morbidities that complicate the patient evaluation  Diabetes, history of H. pylori  Social Determinants of Health:  N/A    Lab Tests:  I Ordered, and personally interpreted labs.  The pertinent results include: CBC shows normocytic anemia with hemoglobin 0.6, CMP shows CO2 of 19 BUN 24 creatinine 1.44, lipase 58,   Imaging Studies ordered:  I ordered imaging studies including CT abdomen pelvis I independently visualized and interpreted imaging which showed pancolitis without bowel perforation no bowel obstruction. I agree with the radiologist interpretation   Cardiac Monitoring:  The patient was maintained on a cardiac monitor.  I personally viewed and interpreted the cardiac monitored which showed an underlying rhythm of: N/A   Medicines ordered and prescription drug management:  I ordered medication including fluids I have reviewed the patients home medicines and have made adjustments as needed  Critical Interventions:  N/A   Reevaluation:  Presents with diarrhea, triage obtain basic lab work imaging which I personally viewed, notable for elevated creatinine, she had a benign physical exam, imaging reveals pancolitis, she is not had any diarrhea since arrival to the ED.  Will provide with fluids and continue to monitor.  Patient was reassessed resting comfortably, she is tolerating p.o., she has no complaints, agreement discharge at this time.  Consultations Obtained:  N/A    Test Considered:  N/A    Rule out Suspicion for UTI [place] or kidney stone is low no endorsing any urinary symptoms, UA is negative signs infection or hematuria.  I doubt C. difficile she has low risk factors no recent antibiotic use she has not been exposed she is never had this in the past.  I doubt bowel obstruction, diverticulitis, volvulus, AAA, kidney stone CT imaging negative these findings.  Suspicion for ischemic colitis/ischemic  bowel is low she has a nonsurgical abdomen she is nontoxic-appearing presentation appears to be atypical of etiology.    Dispostion and problem list  After consideration of the diagnostic results and the patients response to treatment, I feel that the patent would benefit from discharge.  Colitis-suspect this is more viral in nature, will defer on antibiotics, she denies any bloody stools shows no leukocytosis she is afebrile nontachycardic.  Also could be secondary due to the MiraLAX use.  Will recommend a bland diet follow-up with her GI doctor for further evaluation.            Final Clinical Impression(s) / ED Diagnoses Final diagnoses:  Colitis    Rx / DC Orders ED Discharge Orders     None         Marcello Fennel, PA-C 10/01/22 0106    Quintella Reichert, MD 10/03/22 [phone]

## 2022-10-01 ENCOUNTER — Telehealth: Payer: Self-pay | Admitting: Nurse Practitioner

## 2022-10-01 ENCOUNTER — Other Ambulatory Visit: Payer: Self-pay

## 2022-10-01 DIAGNOSIS — R197 Diarrhea, unspecified: Secondary | ICD-10-CM

## 2022-10-01 DIAGNOSIS — A0472 Enterocolitis due to Clostridium difficile, not specified as recurrent: Secondary | ICD-10-CM

## 2022-10-01 LAB — C DIFFICILE QUICK SCREEN W PCR REFLEX
C Diff antigen: POSITIVE — AB
C Diff toxin: NEGATIVE

## 2022-10-01 LAB — URINALYSIS, ROUTINE W REFLEX MICROSCOPIC
Bacteria, UA: NONE SEEN
Bilirubin Urine: NEGATIVE
Glucose, UA: NEGATIVE mg/dL
Ketones, ur: 20 mg/dL — AB
Nitrite: NEGATIVE
Protein, ur: NEGATIVE mg/dL
Specific Gravity, Urine: 1.043 — ABNORMAL HIGH (ref 1.005–1.030)
pH: 5 (ref 5.0–8.0)

## 2022-10-01 LAB — CLOSTRIDIUM DIFFICILE BY PCR, REFLEXED: Toxigenic C. Difficile by PCR: POSITIVE — AB

## 2022-10-01 MED ORDER — VANCOMYCIN HCL 125 MG PO CAPS: 125.0000 mg | ORAL_CAPSULE | Freq: Four times a day (QID) | ORAL | 0 refills | Status: DC

## 2022-10-01 NOTE — Telephone Encounter (Signed)
Pt daughter [name] made aware of recent results and [name] Best NP recommendations: Prescription was sent to pt pharmacy. [name] made aware. Pt was scheduled to see [name] Best NP on 10/14/2022 at 1:30 PM. [name] made aware. [name] verbalized understanding with all questions answered.

## 2022-10-01 NOTE — Telephone Encounter (Signed)
Remo Lipps, pls contact the patient's daughter and let her know her mother's  ED work up yesterday showed she has C. Diff colitis. Pls send in RX for Vancomycin '125mg'$  one capsule po qid x 2 weeks # 56, no refills. Pls schedule her for a follow up appt in 2 weeks, you can add her to my schedule at 3:30pm if no other appointment available. Push fluids, bland diet and contact office if symptoms worsen. Patient to wash hands thoroughly to reduce spreading C. Diff to household members. THX

## 2022-10-01 NOTE — Discharge Instructions (Signed)
Likely this is result from the MiraLAX as well as possible viral infection, this should resolve on its own, I recommend a bland diet.  Please remember to stay hydrated, you may supplement with Gatorade or Pedialyte.  Please follow-up with your GI doctor for reassessment.   Come back to the emergency department if you develop chest pain, shortness of breath, severe abdominal pain, uncontrolled nausea, vomiting, diarrhea.

## 2022-10-01 NOTE — ED Notes (Signed)
Patient ate entire sandwich and applesauce. Denies N/V/D

## 2022-10-01 NOTE — Telephone Encounter (Signed)
Noted. Thanks for your help.  KLB

## 2022-10-01 NOTE — Telephone Encounter (Addendum)
Left message for pt daughter lisa to call back

## 2022-10-04 ENCOUNTER — Telehealth: Payer: Self-pay

## 2022-10-04 NOTE — Telephone Encounter (Signed)
Yes, see other messages regarding + C. Diff result. CTAP showed colitis.  I previously ordered vancomycin 125 mg 1 p.o. 4 times daily for 14 days with instructions for patient to follow-up in the office in 2 weeks.

## 2022-10-04 NOTE — Telephone Encounter (Signed)
Left message for [name] to call back: Left message for pt daughter Meredith Owens to call back:

## 2022-10-04 NOTE — Telephone Encounter (Signed)
Message Received: 4 days ago Noralyn Pick, NP  Thornton Park, MD; Gillermina Hu, RN Remo Lipps, Pls contact  [facility] x-ray as patient was ordered to have a 1 view x-ray done yesterday but looks like she did not do it. THX

## 2022-10-04 NOTE — Telephone Encounter (Signed)
Spoke with pt daughter [name]. [name] stated that after the office visit on 09/29/2022 that they went straight home due to pt not feeling well and did not do the xray. [name] stated that pt went to the ED the next day and CT scan was completed there.

## 2022-10-08 ENCOUNTER — Telehealth: Payer: Self-pay

## 2022-10-08 NOTE — Telephone Encounter (Signed)
     Patient  visit on 2/23  Meredith Owens  Have you been able to follow up with your primary care physician? Yes   The patient was or was not able to obtain any needed medicine or equipment. Yes  Are there diet recommendations that you are having difficulty following? Na   Patient expresses understanding of discharge instructions and education provided has no other needs at this time. Yes       [facility] [phone] [address], [place], [place] [address] Phone: [phone] Email: [email or web address]

## 2022-10-13 ENCOUNTER — Ambulatory Visit: Payer: PPO | Admitting: Podiatry

## 2022-10-14 ENCOUNTER — Other Ambulatory Visit (INDEPENDENT_AMBULATORY_CARE_PROVIDER_SITE_OTHER): Payer: PPO

## 2022-10-14 ENCOUNTER — Encounter: Payer: Self-pay | Admitting: Nurse Practitioner

## 2022-10-14 ENCOUNTER — Ambulatory Visit: Payer: PPO | Admitting: Nurse Practitioner

## 2022-10-14 VITALS — BP 110/60 | HR 60 | Ht 61.75 in | Wt 86.4 lb

## 2022-10-14 DIAGNOSIS — A0472 Enterocolitis due to Clostridium difficile, not specified as recurrent: Secondary | ICD-10-CM

## 2022-10-14 LAB — CBC WITH DIFFERENTIAL/PLATELET
Basophils Absolute: 0 10*3/uL (ref 0.0–0.1)
Basophils Relative: 1 % (ref 0.0–3.0)
Eosinophils Absolute: 0.1 10*3/uL (ref 0.0–0.7)
Eosinophils Relative: 1.9 % (ref 0.0–5.0)
HCT: 32.8 % — ABNORMAL LOW (ref 36.0–46.0)
Hemoglobin: 10.9 g/dL — ABNORMAL LOW (ref 12.0–15.0)
Lymphocytes Relative: 34.1 % (ref 12.0–46.0)
Lymphs Abs: 1.5 10*3/uL (ref 0.7–4.0)
MCHC: 33.3 g/dL (ref 30.0–36.0)
MCV: 97.2 fl (ref 78.0–100.0)
Monocytes Absolute: 0.2 10*3/uL (ref 0.1–1.0)
Monocytes Relative: 5.3 % (ref 3.0–12.0)
Neutro Abs: 2.5 10*3/uL (ref 1.4–7.7)
Neutrophils Relative %: 57.7 % (ref 43.0–77.0)
Platelets: 263 10*3/uL (ref 150.0–400.0)
RBC: 3.38 Mil/uL — ABNORMAL LOW (ref 3.87–5.11)
RDW: 14.9 % (ref 11.5–15.5)
WBC: 4.3 10*3/uL (ref 4.0–10.5)

## 2022-10-14 LAB — BASIC METABOLIC PANEL
BUN: 13 mg/dL (ref 6–23)
CO2: 28 mEq/L (ref 19–32)
Calcium: 10.5 mg/dL (ref 8.4–10.5)
Chloride: 105 mEq/L (ref 96–112)
Creatinine, Ser: 1.06 mg/dL (ref 0.40–1.20)
GFR: 49.44 mL/min — ABNORMAL LOW (ref >60.00)
Glucose, Bld: 88 mg/dL (ref 70–99)
Potassium: 4 mEq/L (ref 3.5–5.1)
Sodium: 140 mEq/L (ref 135–145)

## 2022-10-14 MED ORDER — VANCOMYCIN HCL 125 MG PO CAPS: ORAL_CAPSULE | ORAL | 0 refills | Status: DC

## 2022-10-14 MED ORDER — VANCOMYCIN HCL 125 MG PO CAPS: ORAL_CAPSULE | ORAL | 0 refills | Status: AC

## 2022-10-14 NOTE — Progress Notes (Signed)
10/14/2022 Meredith Owens TO:[id number] 1940-10-20   Chief Complaint: Urgent stools with incontinence   History of Present Illness: Meredith Owens is an 82 year old female with a past medical history of depression, arthritis, Alzheimer's disease, hypertension, hyperlipidemia, CKD, osteoporosis and H.Pylori positive gastric ulcer 06/2022, treated with antibiotic/PPI 07/2022. She is known by Dr. Tarri Glenn. I last saw Meredith Owens more in office accompanied by her daughter on 09/29/2022 for further evaluation regarding constipation, no BM for 3 to 4 days followed by diarrhea after she took MiraLAX.  At that time, an abdominal x-ray to rule out stool-filled colon was ordered and labs including CBC and CMP were done. WBC count was elevated at 12.7.  She had worsening symptoms and abdominal pain therefore she presented to the ED 09/30/2022. Labs showed a WBC count of 10.3.  Hemoglobin 11.6.  Creatinine 1.44. CTAP showed pancolitis.  The ED provider initially thought she had a viral colitis and she was discharged from the ED prior to receiving C. Diff  test results. C. difficile PCR and C. difficile antigen were positive. I contacted the patient and prescribed Vancomycin 125 mg 1 p.o. 4 times daily for 14 days and Florastor probiotic 1 p.o. twice daily.  She presents today for follow-up.  She will complete the 14-day course of Florastor later tonight or tomorrow morning.  Her abdominal pain abated by the third day on vancomycin.  Her diarrhea abated about 1 week ago but she continues to have urgency to pass a BM with intermittent fecal leakage.  She is passing 1 or 2 formed stools daily.  No rectal bleeding.  She is pushing fluids and tolerating a bland diet.  No other complaints at this time.      Latest Ref Rng & Units 09/30/2022    6:19 PM 09/30/2022    5:58 PM 09/29/2022   12:28 PM  CBC  WBC 4.0 - 10.5 K/uL  10.3  12.7   Hemoglobin 12.0 - 15.0 g/dL 11.9  11.6  12.4   Hematocrit 36.0 - 46.0 % 35.0  35.9   37.4   Platelets 150 - 400 K/uL  299  297.0        Latest Ref Rng & Units 09/30/2022    6:19 PM 09/30/2022    5:58 PM 09/29/2022   12:28 PM  CMP  Glucose 70 - 99 mg/dL 79  82  81   BUN 8 - 23 mg/dL '26  24  19   '$ Creatinine 0.44 - 1.00 mg/dL 1.50  1.44  1.12   Sodium 135 - 145 mmol/L 138  136  143   Potassium 3.5 - 5.1 mmol/L 4.2  4.2  4.0   Chloride 98 - 111 mmol/L 107  105  108   CO2 22 - 32 mmol/L  19  22   Calcium 8.9 - 10.3 mg/dL  10.3  11.1   Total Protein 6.5 - 8.1 g/dL  7.0    Total Bilirubin 0.3 - 1.2 mg/dL  0.7    Alkaline Phos 38 - 126 U/L  49    AST 15 - 41 U/L  22    ALT 0 - 44 U/L  17       CTAP 09/30/2022: FINDINGS: Lower chest: No acute abnormality.   Hepatobiliary: Subcentimeter hypodensity too small to characterize. Fluid density lesion within the right inferior hepatic lobe likely a simple hepatic cyst. No focal liver abnormality. No gallstones, gallbladder wall thickening, or pericholecystic fluid. No biliary dilatation.  Pancreas: No focal lesion. Normal pancreatic contour. No surrounding inflammatory changes. No main pancreatic ductal dilatation.   Spleen: Normal in size without focal abnormality.   Adrenals/Urinary Tract:   No adrenal nodule bilaterally.   Bilateral kidneys enhance symmetrically.   No hydronephrosis. No hydroureter.   The urinary bladder is unremarkable.   On delayed imaging, there is no urothelial wall thickening and there are no filling defects in the opacified portions of the bilateral collecting systems or ureters.   Stomach/Bowel: Stomach is within normal limits. No evidence of small bowel wall thickening or dilatation. Limited evaluation to due to decompressed bowel and lack of intraperitoneal fat. Bowel wall thickening and pericolonic fat stranding suggestive of colitis. Associated mesenteric edema. No pneumatosis. The appendix is not definitely identified with no inflammatory changes in the right lower quadrant to  suggest acute appendicitis.   Vascular/Lymphatic: No abdominal aorta or iliac aneurysm. Severe atherosclerotic plaque of the aorta and its branches. No abdominal, pelvic, or inguinal lymphadenopathy.   Reproductive: Status post hysterectomy. No adnexal masses.   Other: No intraperitoneal free fluid. No intraperitoneal free gas. No organized fluid collection.   Musculoskeletal:   No abdominal wall hernia or abnormality.   No suspicious lytic or blastic osseous lesions. No acute displaced fracture. Grade 1 anterolisthesis of L4 on L5.   IMPRESSION: 1. Markedly limited evaluation due to decompressed bowel and lack of intraperitoneal fat. 2. Pancolitis. No definite bowel perforation. No bowel obstruction. 3.  Aortic Atherosclerosis (ICD10-I70.0).    PAST GI PROCEDURES:   EGD 09/14/2022: Esophagus was normal A few small erosions with no bleeding and no stigmata of recent bleeding were found in the gastric antrum.  Biopsies obtained. The examined duodenum was normal. Surgical [P], fundus, gastric antrum, and gastric body - ANTRAL AND OXYNTIC MUCOSA WITH MILD TO MODERATE CHRONIC GASTRITIS. NOTE: WHILE AN IMMUNOHISTOCHEMICAL STAIN IS NEGATIVE FOR HELICOBACTER PYLORI ORGANISMS, THE OVERALL HISTOLOGIC APPEARANCE IS STILL CONCERNING FOR POSSIBLE UNDERLYING HELICOBACTER PYLORI INFECTION, CLINICAL CORRELATION WITH ADDITIONAL TESTING AS CLINICALLY INDICATED.   EGD 07/05/2022: Nonbleeding gastric ulcer without stigmata of bleeding.  Erythematous duodenopathy. Repeat EGD in 10 weeks. 1. Surgical [P], duodenal - PEPTIC DUODENITIS 2. Surgical [P], fundus, gastric antrum, and gastric body - H. PYLORI GASTRITIS - NEGATIVE FOR INTESTINAL METAPLASIA, DYSPLASIA OR MALIGNANCY Review of her medications, a Pylera prescription was prescribed 07/09/2022 but apparently was not covered by insurance.  She was then prescribed high-dose amoxicillin 1000 mg twice daily for 14 days on 07/14/2022.  Current  Outpatient Medications on File Prior to Visit  Medication Sig Dispense Refill   aspirin 81 MG tablet Take 81 mg by mouth daily.     carvedilol (COREG) 12.5 MG tablet TAKE 1 TABLET BY MOUTH TWICE A DAY (Patient taking differently: Take 12.5 mg by mouth 2 (two) times daily.) 180 tablet 2   diclofenac sodium (VOLTAREN) 1 % GEL Apply 4 g topically 4 (four) times daily. (Patient taking differently: Apply 4 g topically 4 (four) times daily as needed (joint pain).) 100 g 1   donepezil (ARICEPT ODT) 5 MG disintegrating tablet Take 2 tablets (10 mg total) by mouth at bedtime. 60 tablet 11   dorzolamide-timolol (COSOPT) 22.3-6.8 MG/ML ophthalmic solution Place 1 drop into the left eye 2 (two) times daily.     hydrALAZINE (APRESOLINE) 10 MG tablet Take 1 tablet (10 mg total) by mouth 3 (three) times daily. 270 tablet 3   memantine (NAMENDA) 10 MG tablet TAKE 1 TABLET BY MOUTH TWICE A DAY 180  tablet 3   mirtazapine (REMERON) 7.5 MG tablet Take 1 tablet by mouth daily.     pantoprazole (PROTONIX) 40 MG tablet Take 1 tablet (40 mg total) by mouth 2 (two) times daily. 90 tablet 3   potassium chloride (KLOR-CON) 10 MEQ tablet Take by mouth.     ROCKLATAN 0.02-0.005 % SOLN Place 1 drop into the left eye at bedtime.  3   rosuvastatin (CRESTOR) 10 MG tablet Take 1 tablet (10 mg total) by mouth every other day. 90 tablet 3   polyethylene glycol (MIRALAX / GLYCOLAX) 17 g packet Take 17 g by mouth as needed. (Patient not taking: Reported on 10/14/2022)     [DISCONTINUED] Calcium Carbonate-Vitamin D 600-400 MG-UNIT tablet Take 2 tablets by mouth daily. 90 tablet 1   Current Facility-Administered Medications on File Prior to Visit  Medication Dose Route Frequency Provider Last Rate Last Admin   0.9 %  sodium chloride infusion  500 mL Intravenous Once [place], MD       0.9 %  sodium chloride infusion  500 mL Intravenous Once [place], MD       Allergies  Allergen Reactions   Brimonidine Other (See  Comments)    Red eyes   Oxycodone Hcl     REACTION: Hives   Prochlorperazine Edisylate     REACTION: anaphylaxis   Latex Itching and Rash    Current Medications, Allergies, Past Medical History, Past Surgical History, Family History and Social History were reviewed in [employer] record.  Review of Systems:   Constitutional: Negative for fever, sweats, chills or weight loss.  Respiratory: Negative for shortness of breath.   Cardiovascular: Negative for chest pain, palpitations and leg swelling.  Gastrointestinal: See HPI.  Musculoskeletal: Negative for back pain or muscle aches.  Neurological: Negative for dizziness, headaches or paresthesias.   Physical Exam: BP 110/60 (BP Location: Left Arm, Patient Position: Sitting, Cuff Size: Normal)   Pulse 60   Ht 5' 1.75" (1.568 m)   Wt 86 lb 6 oz (39.2 kg)   BMI 15.93 kg/m   General: 82 year old female in no acute distress. Head: Normocephalic and atraumatic. Eyes: Right eye deformity.  Conjunctiva pink . Ears: Normal auditory acuity. Mouth: Dentition intact. No ulcers or lesions.  Lungs: Clear throughout to auscultation. Heart: Regular rate and rhythm, no murmur. Abdomen: Soft, nondistended. Mild lower abdominal tenderness without rebound or guarding. No masses or hepatomegaly. Normal bowel sounds x 4 quadrants.  Rectal: Deferred.  Musculoskeletal: Symmetrical with no gross deformities. Extremities: No edema. Neurological: Alert oriented x 4. No focal deficits.  Psychological: Alert and cooperative. Normal mood and affect  Assessment and Recommendations:   82 year old female with C. Diff colitis. C. Diff PCR and C. Diff antigen positive. CTAP 09/30/2022 showed pan colitis without perforation. She was prescribed Vancomycin 125 mg 1 p.o. 4 times daily for 14 days. She will finish the course of Vancomycin either tonight or tomorrow morning.  No further lower abdominal pain but she continues to have urgency with  fecal leakage. -Patient will complete the previously prescribed 14-day course of Vancomycin later tonight or tomorrow morning. Due to her ongoing urgency with fecal leakage, I elected to extend her Vancomycin course as follows: Vancomycin 125 mg 1 capsule p.o. 3 times daily for 7 days then 1 p.o. twice daily for 7 days then 1 p.o. daily for 7 days -Florastor probiotic 1 p.o. twice daily for 4 weeks if affordable -Push fluids, diet as  tolerated -Daughter to contact office if diarrhea or abdominal recurs or if fecal urgency worsens  History of gastric ulcer + H. Pylori 06/2022 per EGD, treated with Amox/Flagyl/ Pepto and PPI. Repeat EGD 09/14/2022 identified chronic gastritis, biopsies were consistent with chronic gastritis with some features that suggest H. pylori but the infection was not accurately identified. [facility] H. pylori antigen test 08/23/2022 resulted negative.   -On Pantoprazole 40 mg twice daily   History of dementia   History of colon polyps colonoscopy 2017.  CTAP 06/17/2022 without intra-abdominal/pelvic pathology. Normal large and small bowel.

## 2022-10-14 NOTE — Patient Instructions (Addendum)
_______________________________________________________  If your blood pressure at your visit was 140/90 or greater, please contact your primary care physician to follow up on this.  _______________________________________________________  If you are age 81 or older, your body mass index should be between 23-30. Your Body mass index is 15.93 kg/m. If this is out of the aforementioned range listed, please consider follow up with your Primary Care Provider.  If you are age 57 or younger, your body mass index should be between 19-25. Your Body mass index is 15.93 kg/m. If this is out of the aformentioned range listed, please consider follow up with your Primary Care Provider.   ________________________________________________________  The [facility] providers would like to encourage you to use [facility] to communicate with providers for non-urgent requests or questions.  Due to long hold times on the telephone, sending your provider a message by [facility] may be a faster and more efficient way to get a response.  Please allow 48 business hours for a response.  Please remember that this is for non-urgent requests.  _______________________________________________________  Vancomycin Taper  125 mg Take three times daily for 1 week then take twice daily for 1 week then 1 by mouth daily for 1 week  Flora Stor Probiotic over the counter take twice daily by mouth for 4 weeks  Your provider has requested that you go to the basement level for lab work before leaving today. Press "B" on the elevator. The lab is located at the first door on the left as you exit the elevator.

## 2022-10-15 NOTE — Progress Notes (Signed)
Reviewed and agree with management plans. ? ?Sharee Sturdy L. Fread Kottke, MD, MPH  ?

## 2022-10-21 ENCOUNTER — Ambulatory Visit: Payer: PPO | Admitting: Physician Assistant

## 2022-10-31 NOTE — Progress Notes (Signed)
Reviewed and agree with management plans. ? ?Inioluwa Baris L. Larken Urias, MD, MPH  ?

## 2022-11-17 ENCOUNTER — Other Ambulatory Visit: Payer: PPO

## 2022-11-17 DIAGNOSIS — A0472 Enterocolitis due to Clostridium difficile, not specified as recurrent: Secondary | ICD-10-CM

## 2022-11-23 LAB — CALPROTECTIN, FECAL: Calprotectin, Fecal: 53 ug/g (ref 0–120)

## 2023-01-07 ENCOUNTER — Other Ambulatory Visit: Payer: Self-pay | Admitting: Gastroenterology

## 2023-01-12 ENCOUNTER — Telehealth: Payer: Self-pay | Admitting: Nurse Practitioner

## 2023-01-12 NOTE — Telephone Encounter (Signed)
[facility] called on behalf of patient. She is requesting a call back at [phone] to discuss if the patient needs a follow visit in the future so she can send authorization for it. Please advise, thank you.

## 2023-01-12 NOTE — Telephone Encounter (Signed)
Spoke with [place] at [facility] to advise that patient does not need follow up with our office unless she continues with abdominal pain, diarrhea or fecal urgency following treatment for CDiff. She verbalizes understanding and states that she saw the patient yesterday and she was not having any gi symptoms.

## 2023-01-19 DIAGNOSIS — H02422 Myogenic ptosis of left eyelid: Secondary | ICD-10-CM | POA: Diagnosis not present

## 2023-01-19 DIAGNOSIS — H401222 Low-tension glaucoma, left eye, moderate stage: Secondary | ICD-10-CM | POA: Diagnosis not present

## 2023-01-19 DIAGNOSIS — Z970 Presence of artificial eye: Secondary | ICD-10-CM | POA: Diagnosis not present

## 2023-01-19 DIAGNOSIS — Z961 Presence of intraocular lens: Secondary | ICD-10-CM | POA: Diagnosis not present

## 2023-01-19 DIAGNOSIS — H43812 Vitreous degeneration, left eye: Secondary | ICD-10-CM | POA: Diagnosis not present

## 2023-03-02 ENCOUNTER — Telehealth: Payer: Self-pay | Admitting: Neurology

## 2023-03-02 NOTE — Telephone Encounter (Signed)
Nothing needed at this time.  

## 2023-03-02 NOTE — Telephone Encounter (Signed)
Pt's daughter states pt insurance is now with [facility], she states as long as Dr Teresa Coombs does not change the Rx for memantine (NAMENDA) 10 MG tablet [facility] will continue to deliver pt's medication to their home(which is what pt and daughter prefers)

## 2023-03-02 NOTE — Telephone Encounter (Signed)
Perfect! So is there anything that she needs on our end at this time? Was this just to keep [place] informed of the change?

## 2023-03-15 ENCOUNTER — Telehealth: Payer: Self-pay | Admitting: Nurse Practitioner

## 2023-03-15 NOTE — Telephone Encounter (Signed)
Inbound call from patient's daughter stating patient medications do not need to be filled through [facility]. Patient medications are being filled through [facility]. Patient's daughter requesting a call back to discuss further. Please advise, thank you.

## 2023-03-16 NOTE — Telephone Encounter (Signed)
Pt's daughter called. Pt's daughter stated that her mom no longer has [employer] & would like for her medication to not be refilled.

## 2023-03-16 NOTE — Telephone Encounter (Signed)
Contacted pt & LVM to return call 

## 2023-03-17 NOTE — Telephone Encounter (Signed)
DD, thank you for the update, I soon the medication in question is Pantoprazole?  Please inform the daughter if patient develops any stomach discomfort or heartburn she can take over-the-counter Omeprazole 20 mg 1 p.o. daily as needed.  Patient to follow-up in our office if she has any ongoing GI symptoms.  Thank you

## 2023-04-19 ENCOUNTER — Ambulatory Visit (INDEPENDENT_AMBULATORY_CARE_PROVIDER_SITE_OTHER): Payer: Medicare (Managed Care) | Admitting: Neurology

## 2023-04-19 ENCOUNTER — Encounter: Payer: Self-pay | Admitting: Neurology

## 2023-04-19 VITALS — BP 136/80 | Ht 60.0 in | Wt 88.0 lb

## 2023-04-19 DIAGNOSIS — F02A0 Dementia in other diseases classified elsewhere, mild, without behavioral disturbance, psychotic disturbance, mood disturbance, and anxiety: Secondary | ICD-10-CM

## 2023-04-19 DIAGNOSIS — G301 Alzheimer's disease with late onset: Secondary | ICD-10-CM

## 2023-04-19 NOTE — Patient Instructions (Addendum)
Continue with Aricept 10 mg nightly Continue with Namenda 10 mg twice daily Continue your other medications including mirtazapine and boost supplement Continue to follow-up with your doctors Return as needed
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
## 2023-05-02 ENCOUNTER — Telehealth: Payer: Self-pay | Admitting: Pharmacist

## 2023-05-02 NOTE — Telephone Encounter (Signed)
Attempted to contact patient for follow-up of medication adherence - rosuvastatin.   Left HIPAA compliant voice mail requesting call back to direct phone: [phone]  Total time with patient call and documentation of interaction: 2 minutes.

## 2023-05-10 ENCOUNTER — Telehealth: Payer: Self-pay | Admitting: Cardiology

## 2023-05-10 NOTE — Telephone Encounter (Signed)
Just FYI--Patient's daughter states [facility] is taking over and they will handle all patient's prescriptions going forward.

## 2023-05-12 NOTE — Telephone Encounter (Signed)
Noted thank you

## 2023-06-22 NOTE — Progress Notes (Unsigned)
Cardiology Office Note    Patient Name: Meredith Owens Date of Encounter: 06/23/2023  Primary Care Provider:  Kallie Locks, FNP Primary Cardiologist:  None Primary Electrophysiologist: None   Past Medical History    Past Medical History:  Diagnosis Date   Alzheimer disease (HCC) 2022   Arthritis    Bilateral leg cramps 07/28/2012   Cataract    Chronic fatigue 01/20/2016   Chronic kidney disease    Depression    Glaucoma    H. pylori infection    Hyperlipidemia    Hypertension    Lower leg edema 08/18/2018   Mild cognitive impairment with memory loss 03/13/2018   Osteoporosis    Pain in joint of right shoulder 04/26/2018   Prediabetes 11/20/2013   A1c 6.2 - 11/2013    Trigger finger of left thumb 08/12/2017    History of Present Illness  Meredith Owens is a 82 y.o. female with PMH of HLD, HTN, prediabetes, arthritis, Alzheimer's dementia, chronic kidney disease stage IIIa who presents today for 1 year follow-up.  Meredith Owens was last seen 03/19/2022 for annual follow-up.  She was found to have controlled blood pressures during her follow-up visit her daughter reported that she was having some memory issues and was taking her medications improperly.  She reported resolution of her atypical chest pain and patient was advised to obtain new blood pressure cuff to monitor BP.  She noted resolution of her palpitations and patient was referred to neurology due to poor memory.  She was found to have progressed mild Alzheimer's dementia her Aricept was increased and patient was started on Namenda.  She was diagnosed with a H. pylori positive gastric ulcer on 06/2022 underwent EGD which showed erosions in the gastric antrum.  She was treated multiple times for colitis and hyponatremia over the past year.  She has continued to have memory decline and is currently living with her daughter.  Meredith Owens presents today with her daughter for 1 year follow-up.  Since last being seen she has  experienced progressive weight loss due to esophageal erosions and colitis.  She has suffered weight loss despite being on a bland diet and consuming supplemental shakes like Ensure. She has been taking Remeron, an appetite stimulant, but the dosage and duration are not specified. She also reports a slow heartbeat, which is managed with carvedilol. Her blood pressure has been in the 140s at home, despite taking hydralazine 25mg  three times a day since June. She is under the care of [facility], which helps manage her medications. Patient denies chest pain, palpitations, dyspnea, PND, orthopnea, nausea, vomiting, dizziness, syncope, edema, weight gain, or early satiety.  Review of Systems  Please see the history of present illness.    All other systems reviewed and are otherwise negative except as noted above.  Physical Exam    Wt Readings from Last 3 Encounters:  06/23/23 80 lb 6.4 oz (36.5 kg)  04/19/23 88 lb (39.9 kg)  10/14/22 86 lb 6 oz (39.2 kg)   VS: Vitals:   06/23/23 1526 06/23/23 1727  BP: (!) 162/60 (!) 162/60  Pulse: (!) 54   SpO2: 98%   ,Body mass index is 15.7 kg/m. GEN: Malnourished and emaciated elderly female Neck: No JVD; No carotid bruits Pulmonary: Clear to auscultation without rales, wheezing or rhonchi  Cardiovascular: Normal rate. Regular rhythm. Normal S1. Normal S2.   Murmurs: There is no murmur.  ABDOMEN: Soft, non-tender, non-distended EXTREMITIES:  No edema; No deformity  EKG/LABS/ Recent Cardiac Studies   ECG personally reviewed by me today -sinus bradycardia with rate of 54 bpm and no acute changes consistent with previous EKG.  Risk Assessment/Calculations:          Lab Results  Component Value Date   WBC 4.3 10/14/2022   HGB 10.9 (L) 10/14/2022   HCT 32.8 (L) 10/14/2022   MCV 97.2 10/14/2022   PLT 263.0 10/14/2022   Lab Results  Component Value Date   CREATININE 1.06 10/14/2022   BUN 13 10/14/2022   NA 140 10/14/2022   K 4.0 10/14/2022   CL  105 10/14/2022   CO2 28 10/14/2022   Lab Results  Component Value Date   CHOL 174 01/05/2018   HDL 77 01/05/2018   LDLCALC 84 01/05/2018   TRIG 67 01/05/2018   CHOLHDL 2.3 01/05/2018    Lab Results  Component Value Date   HGBA1C 5.6 01/11/2020   Assessment & Plan    1.  Essential hypertension: -HYPERTENSION CONTROL Vitals:   06/23/23 1526 06/23/23 1727  BP: (!) 162/60 (!) 162/60    The patient's blood pressure is elevated above target today.  In order to address the patient's elevated BP: Blood pressure will be monitored at home to determine if medication changes need to be made.; Follow up with primary care provider for management.     -Continue carvedilol 12.5 mg twice daily and hydralazine 25 mg 3 times daily  2.  Hyperlipidemia: -We will await copy of most recent LDL from PCP -Continue Crestor 10 mg daily  3.  CKD stage IIIa: -Most recent creatinine was 1.06  4.  Alzheimer's dementia: -Currently monitored by neurology -Continue Namenda 10 mg twice daily and Aricept 5 mg twice daily  Disposition: Follow-up with None or APP in 12 months    Signed, Napoleon Form, Leodis Rains, NP 06/23/2023, 5:27 PM [facility]

## 2023-06-23 ENCOUNTER — Ambulatory Visit: Payer: Medicare (Managed Care) | Attending: Nurse Practitioner | Admitting: Nurse Practitioner

## 2023-06-23 ENCOUNTER — Encounter: Payer: Self-pay | Admitting: Nurse Practitioner

## 2023-06-23 VITALS — BP 162/60 | HR 54 | Ht 60.0 in | Wt 80.4 lb

## 2023-06-23 DIAGNOSIS — N183 Chronic kidney disease, stage 3 unspecified: Secondary | ICD-10-CM

## 2023-06-23 DIAGNOSIS — E7800 Pure hypercholesterolemia, unspecified: Secondary | ICD-10-CM | POA: Diagnosis not present

## 2023-06-23 DIAGNOSIS — Z09 Encounter for follow-up examination after completed treatment for conditions other than malignant neoplasm: Secondary | ICD-10-CM

## 2023-06-23 DIAGNOSIS — G309 Alzheimer's disease, unspecified: Secondary | ICD-10-CM

## 2023-06-23 DIAGNOSIS — I10 Essential (primary) hypertension: Secondary | ICD-10-CM | POA: Diagnosis not present

## 2023-06-23 DIAGNOSIS — F0280 Dementia in other diseases classified elsewhere without behavioral disturbance: Secondary | ICD-10-CM

## 2023-06-23 NOTE — Patient Instructions (Signed)
 Medication Instructions:  Your physician recommends that you continue on your current medications as directed. Please refer to the Current Medication list given to you today. *If you need a refill on your cardiac medications before your next appointment, please call your pharmacy*   Lab Work: None ordered   Testing/Procedures: None ordered   Follow-Up: At [facility], you and your health needs are our priority.  As part of our continuing mission to provide you with exceptional heart care, we have created designated Provider Care Teams.  These Care Teams include your primary Cardiologist (physician) and Advanced Practice Providers (APPs -  Physician Assistants and Nurse Practitioners) who all work together to provide you with the care you need, when you need it.  We recommend signing up for the patient portal called "MyChart".  Sign up information is provided on this After Visit Summary.  MyChart is used to connect with patients for Virtual Visits (Telemedicine).  Patients are able to view lab/test results, encounter notes, upcoming appointments, etc.  Non-urgent messages can be sent to your provider as well.   To learn more about what you can do with MyChart, go to [email or web address].    Your next appointment:   12 month(s)  Provider:   Candee Furbish, MD     Other Instructions

## 2023-06-28 ENCOUNTER — Telehealth: Payer: Self-pay | Admitting: Neurology

## 2023-06-28 NOTE — Telephone Encounter (Signed)
Pt's daughter calling to schedule appointment regarding FL2. Scheduled 01/30/24 at 9:15am.

## 2023-07-18 ENCOUNTER — Encounter: Payer: Self-pay | Admitting: Podiatry

## 2023-07-18 ENCOUNTER — Ambulatory Visit: Payer: Medicare (Managed Care) | Admitting: Podiatry

## 2023-07-18 VITALS — Ht 60.0 in | Wt 80.4 lb

## 2023-07-18 DIAGNOSIS — M79674 Pain in right toe(s): Secondary | ICD-10-CM

## 2023-07-18 DIAGNOSIS — M79675 Pain in left toe(s): Secondary | ICD-10-CM | POA: Diagnosis not present

## 2023-07-18 DIAGNOSIS — B351 Tinea unguium: Secondary | ICD-10-CM

## 2023-07-20 ENCOUNTER — Ambulatory Visit: Payer: PPO | Admitting: Podiatry

## 2023-07-21 NOTE — Progress Notes (Incomplete)
  Subjective:  Patient ID: Meredith Owens, female    DOB: [birth year],  MRN: [id number]  82 y.o. female presents to clinic with  painful elongated mycotic toenails 1-5 bilaterally which are tender when wearing enclosed shoe gear. Pain is relieved with periodic professional debridement.  Chief Complaint  Patient presents with  . Nail Problem    Pt is here for [facility] last A1C was 6 PCP is Dr Bradly Chris and LOV was a few months ago.     New problem(s): None   PCP is Kallie Locks, FNP.  Allergies  Allergen Reactions  . Brimonidine Other (See Comments)    Red eyes  . Oxycodone Hcl     REACTION: Hives  . Prochlorperazine Edisylate     REACTION: anaphylaxis  . Latex Itching and Rash    Review of Systems: Negative except as noted in the HPI.   Objective:  Meredith Owens is a pleasant 82 y.o. female thin build in NAD.Marland Kitchen AAO x 3.  Vascular Examination: Vascular status intact b/l with palpable pedal pulses. CFT immediate b/l. No edema. No pain with calf compression b/l. Skin temperature gradient WNL b/l. No cyanosis or clubbing noted b/l LE.  Neurological Examination: Sensation grossly intact b/l with 10 gram monofilament. Vibratory sensation intact b/l.   Dermatological Examination: Pedal skin with normal turgor, texture and tone b/l. Toenails 1-5 b/l thick, discolored, elongated with subungual debris and pain on dorsal palpation. No hyperkeratotic lesions noted b/l.   Musculoskeletal Examination: Muscle strength 5/5 to b/l LE. HAV with bunion deformity noted b/l LE.  Radiographs: None  Last A1c:       No data to display           Assessment:   1. Pain due to onychomycosis of toenails of both feet    Plan:  Patient was evaluated and treated. All patient's and/or POA's questions/concerns addressed on today's visit. Mycotic toenails 1-5 debrided in length and girth without incident. Continue soft, supportive shoe gear daily. Report any pedal injuries to medical professional.  Call office if there are any quesitons/concerns. -Patient/POA to call should there be question/concern in the interim.  Return in about 3 months (around 10/16/2023).  Freddie Breech, DPM      [place] LOCATION: [address] [place] [address]                   Office [phone]   [facility] LOCATION: [address] [place], [place] [address] Office [phone]

## 2023-07-21 NOTE — Progress Notes (Signed)
  Subjective:  Patient ID: Meredith Owens, female    DOB: [birth year],  MRN: [id number]  82 y.o. female presents to clinic with  painful elongated mycotic toenails 1-5 bilaterally which are tender when wearing enclosed shoe gear. Pain is relieved with periodic professional debridement.  Chief Complaint  Patient presents with  . Nail Problem    Pt is here for [facility] last A1C was 6 PCP is Dr Bradly Chris and LOV was a few months ago.     New problem(s): None   PCP is Kallie Locks, FNP.  Allergies  Allergen Reactions  . Brimonidine Other (See Comments)    Red eyes  . Oxycodone Hcl     REACTION: Hives  . Prochlorperazine Edisylate     REACTION: anaphylaxis  . Latex Itching and Rash    Review of Systems: Negative except as noted in the HPI.   Objective:  Meredith Owens is a pleasant 82 y.o. female thin build in NAD.Marland Kitchen AAO x 3.  Vascular Examination: Vascular status intact b/l with palpable pedal pulses. CFT immediate b/l. No edema. No pain with calf compression b/l. Skin temperature gradient WNL b/l. No cyanosis or clubbing noted b/l LE.  Neurological Examination: Sensation grossly intact b/l with 10 gram monofilament. Vibratory sensation intact b/l.   Dermatological Examination: Pedal skin with normal turgor, texture and tone b/l. Toenails 1-5 b/l thick, discolored, elongated with subungual debris and pain on dorsal palpation. No hyperkeratotic lesions noted b/l.   Musculoskeletal Examination: Muscle strength 5/5 to b/l LE. HAV with bunion deformity noted b/l LE.  Radiographs: None  Last A1c:       No data to display           Assessment:   1. Pain due to onychomycosis of toenails of both feet    Plan:  Patient was evaluated and treated. All patient's and/or POA's questions/concerns addressed on today's visit. Mycotic toenails 1-5 debrided in length and girth without incident. Continue soft, supportive shoe gear daily. Report any pedal injuries to medical professional.  Call office if there are any quesitons/concerns. -Patient/POA to call should there be question/concern in the interim.  Return in about 3 months (around 10/16/2023).  Freddie Breech, DPM      [place] LOCATION: [address] [place] [address]                   Office [phone]   [facility] LOCATION: [address] [place], [place] [address] Office [phone]

## 2023-08-31 ENCOUNTER — Ambulatory Visit: Payer: Self-pay | Admitting: *Deleted

## 2023-08-31 NOTE — Telephone Encounter (Signed)
  Chief Complaint: Daughter, Zada Girt (On [place])  was concerned her mother was going to run out of her medications before being seen as a new pt with [facility] on 10/07/2023 with Debera Lat, PA-C however she has enough to last until the first week of March 2025.   Misty Stanley thanked me for returning her call however her mother is not going to run out of medications before being seen.   Symptoms: N/A Frequency: N/A Pertinent Negatives: Patient denies N/A Disposition: [] ED /[] Urgent Care (no appt availability in office) / [x] Appointment(In office/virtual)/ []  [facility]/ [] Home Care/ [] Refused Recommended Disposition /[] [facility]/ []  Follow-up with PCP Additional Notes: Keep appt with Debera Lat, PA-C on 10/07/2023 for new pt appt.

## 2023-08-31 NOTE — Telephone Encounter (Signed)
Message from Meredith Owens sent at 08/31/2023 12:17 PM EST  Summary: Medication Managment   Caller wanted to schedule a new patient appointment with Dr. Ronnald Ramp, the first available appointment is not until March 5. Caller states her mother current PCP is no longer in the office and she sates her mother will run out of her prescription prior to March 5.  Please reach out to patient daughter and suggest what she can do regarding getting her medication filled and if OK  please schedule her as well.          Call History  Contact Date/Time Type Contact Phone/Fax By  08/31/2023 12:09 PM EST Phone (Incoming) Smith,Lisa (Emergency Contact) [phone] Judie Petit) Elliot Gault   Reason for Disposition  Caller has medicine question only, adult not sick, AND triager answers question  Answer Assessment - Initial Assessment Questions 1. NAME of MEDICINE: "What medicine(s) are you calling about?"     I returned call to Zada Girt, daughter (On [place]).   All of her medications will be running out at the beginning of March 2025.   The provider her mother used to see is no longer with that practice.   They won't tell us where she went and the other providers in the office are not accepting new pts.   I've made an appt with y'all [facility]) for my mother for Feb.  (10/07/2023 with Debera Lat, PA-C) for a new pt appt.   2. QUESTION: "What is your question?" (e.g., double dose of medicine, side effect)     I was worried about her medications running out before she can be seen but she is good until the first week of March.   So she will have seen her new doctor by then and can get refills.   I appreciate you calling me back.   I let her know that in that situation if we were not able to see her in time to get her refills before they ran out she would need to go to the urgent care to get refills to last until her new pt appt.   In this case that wasn't going to be necessary. 3.  PRESCRIBER: "Who prescribed the medicine?" Reason: if prescribed by specialist, call should be referred to that group.     Former provider that is no longer with the practice her mother used to go to. 4. SYMPTOMS: "Do you have any symptoms?" If Yes, ask: "What symptoms are you having?"  "How bad are the symptoms (e.g., mild, moderate, severe)     N/A 5. PREGNANCY:  "Is there any chance that you are pregnant?" "When was your last menstrual period?"     N/A  Protocols used: Medication Question Call-A-AH

## 2023-10-07 ENCOUNTER — Ambulatory Visit (INDEPENDENT_AMBULATORY_CARE_PROVIDER_SITE_OTHER): Payer: Medicare PPO | Admitting: Physician Assistant

## 2023-10-07 ENCOUNTER — Encounter: Payer: Self-pay | Admitting: Physician Assistant

## 2023-10-07 VITALS — BP 195/62 | HR 57 | Ht 61.0 in | Wt 90.0 lb

## 2023-10-07 DIAGNOSIS — E43 Unspecified severe protein-calorie malnutrition: Secondary | ICD-10-CM

## 2023-10-07 DIAGNOSIS — R7303 Prediabetes: Secondary | ICD-10-CM | POA: Diagnosis not present

## 2023-10-07 DIAGNOSIS — H4089 Other specified glaucoma: Secondary | ICD-10-CM

## 2023-10-07 DIAGNOSIS — K219 Gastro-esophageal reflux disease without esophagitis: Secondary | ICD-10-CM

## 2023-10-07 DIAGNOSIS — G301 Alzheimer's disease with late onset: Secondary | ICD-10-CM

## 2023-10-07 DIAGNOSIS — E782 Mixed hyperlipidemia: Secondary | ICD-10-CM

## 2023-10-07 DIAGNOSIS — E440 Moderate protein-calorie malnutrition: Secondary | ICD-10-CM

## 2023-10-07 DIAGNOSIS — N1832 Chronic kidney disease, stage 3b: Secondary | ICD-10-CM

## 2023-10-07 DIAGNOSIS — F02A0 Dementia in other diseases classified elsewhere, mild, without behavioral disturbance, psychotic disturbance, mood disturbance, and anxiety: Secondary | ICD-10-CM

## 2023-10-07 DIAGNOSIS — N39498 Other specified urinary incontinence: Secondary | ICD-10-CM

## 2023-10-07 DIAGNOSIS — Z7689 Persons encountering health services in other specified circumstances: Secondary | ICD-10-CM

## 2023-10-07 DIAGNOSIS — I10 Essential (primary) hypertension: Secondary | ICD-10-CM

## 2023-10-07 DIAGNOSIS — H02402 Unspecified ptosis of left eyelid: Secondary | ICD-10-CM
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
## 2023-10-10 ENCOUNTER — Telehealth: Payer: Self-pay | Admitting: Cardiology

## 2023-10-10 ENCOUNTER — Telehealth: Payer: Self-pay | Admitting: Neurology

## 2023-10-10 ENCOUNTER — Other Ambulatory Visit: Payer: Self-pay | Admitting: Neurology

## 2023-10-10 MED ORDER — ROSUVASTATIN CALCIUM 10 MG PO TABS: 10.0000 mg | ORAL_TABLET | ORAL | 2 refills | Status: AC

## 2023-10-10 MED ORDER — MEMANTINE HCL 10 MG PO TABS: 10.0000 mg | ORAL_TABLET | Freq: Two times a day (BID) | ORAL | 3 refills | Status: DC

## 2023-10-10 MED ORDER — HYDRALAZINE HCL 25 MG PO TABS: 25.0000 mg | ORAL_TABLET | Freq: Three times a day (TID) | ORAL | 1 refills | Status: DC

## 2023-10-10 MED ORDER — CARVEDILOL 12.5 MG PO TABS: 12.5000 mg | ORAL_TABLET | Freq: Two times a day (BID) | ORAL | 2 refills | Status: AC

## 2023-10-10 MED ORDER — DONEPEZIL HCL 10 MG PO TABS: 10.0000 mg | ORAL_TABLET | Freq: Every day | ORAL | 3 refills | Status: DC

## 2023-10-10 MED ORDER — HYDRALAZINE HCL 10 MG PO TABS: 10.0000 mg | ORAL_TABLET | Freq: Three times a day (TID) | ORAL | 2 refills | Status: DC

## 2023-10-10 NOTE — Telephone Encounter (Signed)
 done

## 2023-10-10 NOTE — Telephone Encounter (Signed)
 Daughter reports patient not being cared for properly at SNF and is now living with her, she is needing refills of Aricept and Namenda

## 2023-10-10 NOTE — Telephone Encounter (Signed)
 Pt's daughter called wanting to discuss restarting the pt on the donepezil (ARICEPT ODT) 5 MG disintegrating tablet (Expired) and the memantine (NAMENDA) 10 MG tablet since the pt has moved out of the rehab. Please advise.

## 2023-10-10 NOTE — Telephone Encounter (Signed)
*  STAT* If patient is at the pharmacy, call can be transferred to refill team.   1. Which medications need to be refilled? (please list name of each medication and dose if known) carvedilol (COREG) 12.5 MG tablet   hydrALAZINE (APRESOLINE) 10 MG tablet   rosuvastatin (CRESTOR) 10 MG tablet   2. Which pharmacy/location (including street and city if local pharmacy) is medication to be sent to? [id number] - [place],  - [address]   3. Do they need a 30 day or 90 day supply? 90

## 2023-10-11 DIAGNOSIS — G301 Alzheimer's disease with late onset: Secondary | ICD-10-CM | POA: Insufficient documentation

## 2023-10-12 ENCOUNTER — Ambulatory Visit: Payer: Medicare PPO | Admitting: Family Medicine

## 2023-10-17 ENCOUNTER — Ambulatory Visit (INDEPENDENT_AMBULATORY_CARE_PROVIDER_SITE_OTHER): Payer: Medicare PPO | Admitting: Podiatry

## 2023-10-17 DIAGNOSIS — Z91199 Patient's noncompliance with other medical treatment and regimen due to unspecified reason: Secondary | ICD-10-CM

## 2023-10-19 NOTE — Progress Notes (Signed)
 1. No-show for appointment

## 2023-10-24 MED ORDER — DONEPEZIL HCL 5 MG PO TBDP: 10.0000 mg | ORAL_TABLET | Freq: Every day | ORAL | 11 refills | Status: AC

## 2023-10-24 MED ORDER — MEMANTINE HCL 10 MG PO TABS: 10.0000 mg | ORAL_TABLET | Freq: Two times a day (BID) | ORAL | 3 refills | Status: AC

## 2023-10-31 ENCOUNTER — Ambulatory Visit: Admitting: Podiatry

## 2023-10-31 ENCOUNTER — Encounter: Payer: Self-pay | Admitting: Podiatry

## 2023-10-31 VITALS — Ht 61.0 in | Wt 90.0 lb

## 2023-10-31 DIAGNOSIS — B351 Tinea unguium: Secondary | ICD-10-CM

## 2023-10-31 DIAGNOSIS — M79675 Pain in left toe(s): Secondary | ICD-10-CM | POA: Diagnosis not present

## 2023-10-31 DIAGNOSIS — M79674 Pain in right toe(s): Secondary | ICD-10-CM

## 2023-11-04 NOTE — Progress Notes (Signed)
  Subjective:  Patient ID: Meredith Owens, female    DOB: Dec 18, 1940,  MRN: [id number]  83 y.o. female presents painful, elongated thickened toenails x 10 which are symptomatic when wearing enclosed shoe gear. This interferes with his/her daily activities. Chief Complaint  Patient presents with   Nail Problem    Pt is here for [facility] PCP is Dr Bradly Chris and LOV was in January.   New problem(s): None   PCP is Kallie Locks, FNP.  Allergies  Allergen Reactions   Brimonidine Other (See Comments)    Red eyes   Oxycodone Hcl     REACTION: Hives   Prochlorperazine Edisylate     REACTION: anaphylaxis   Latex Itching and Rash   Review of Systems: Negative except as noted in the HPI.   Objective:  Meredith Owens is a pleasant 83 y.o. female thin build in NAD. AAO x 3.  Vascular Examination: Vascular status intact b/l with palpable pedal pulses. CFT immediate b/l. Pedal hair present. No edema. No pain with calf compression b/l. Skin temperature gradient WNL b/l. No varicosities noted. No cyanosis or clubbing noted.  Neurological Examination: Sensation grossly intact b/l with 10 gram monofilament. Vibratory sensation intact b/l.  Dermatological Examination: Pedal skin with normal turgor, texture and tone b/l. No open wounds nor interdigital macerations noted. Toenails 1-5 b/l thick, discolored, elongated with subungual debris and pain on dorsal palpation. No hyperkeratotic lesions noted b/l.   Musculoskeletal Examination: Muscle strength 5/5 to b/l LE.  No pain, crepitus noted b/l. HAV with bunion deformity noted b/l LE.  Radiographs: None  Last A1c:       No data to display         Assessment:   1. Pain due to onychomycosis of toenails of both feet    Plan:  Consent given for treatment. Patient examined. All patient's and/or POA's questions/concerns addressed on today's visit.Toenails 1-5 debrided in length and girth without incident. Continue soft, supportive shoe gear  daily. Report any pedal injuries to medical professional. Call office if there are any questions/concerns. -Patient/POA to call should there be question/concern in the interim.  Return in about 3 months (around 01/31/2024).  Freddie Breech, DPM      [place] LOCATION: [address], [place] [address]                   Office [phone]   [facility] LOCATION: [address] [place], [place] [address] Office [phone]
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
## 2023-11-08 ENCOUNTER — Ambulatory Visit (INDEPENDENT_AMBULATORY_CARE_PROVIDER_SITE_OTHER): Payer: Medicare PPO | Admitting: Family Medicine

## 2023-11-08 ENCOUNTER — Encounter: Payer: Self-pay | Admitting: Family Medicine

## 2023-11-08 VITALS — BP 130/74 | HR 53 | Resp 16 | Ht 61.0 in | Wt 92.0 lb

## 2023-11-08 DIAGNOSIS — E43 Unspecified severe protein-calorie malnutrition: Secondary | ICD-10-CM | POA: Insufficient documentation

## 2023-11-08 DIAGNOSIS — E876 Hypokalemia: Secondary | ICD-10-CM | POA: Insufficient documentation

## 2023-11-08 DIAGNOSIS — H02402 Unspecified ptosis of left eyelid: Secondary | ICD-10-CM

## 2023-11-08 DIAGNOSIS — F02A0 Dementia in other diseases classified elsewhere, mild, without behavioral disturbance, psychotic disturbance, mood disturbance, and anxiety: Secondary | ICD-10-CM

## 2023-11-08 DIAGNOSIS — G301 Alzheimer's disease with late onset: Secondary | ICD-10-CM | POA: Diagnosis not present

## 2023-11-08 DIAGNOSIS — E7800 Pure hypercholesterolemia, unspecified: Secondary | ICD-10-CM

## 2023-11-08 DIAGNOSIS — I10 Essential (primary) hypertension: Secondary | ICD-10-CM

## 2023-11-08 MED ORDER — HYDRALAZINE HCL 25 MG PO TABS: 25.0000 mg | ORAL_TABLET | Freq: Three times a day (TID) | ORAL | 1 refills | Status: AC

## 2023-11-08 NOTE — Assessment & Plan Note (Signed)
 Experienced weight loss and is currently on mirtazapine? (Not on patient's medication list but patient's daughter reports  to help with mood and appetite. Importance of maintaining weight discussed, and nutritional supplements like Boost or protein milkshakes suggested to aid in weight gain. - Encourage nutritional supplements like Boost or protein milkshakes

## 2023-11-08 NOTE — Assessment & Plan Note (Signed)
 Chronic  CMP  Currently on potassium supplements daily

## 2023-11-08 NOTE — Assessment & Plan Note (Signed)
 Chronic  Reports ongoing memory issues, including difficulty with numbers and conversations. Currently taking donepezil and memantine. Under care of neurologist Dr. Levert Feinstein but has not seen her in over a year. Acknowledged memory changes and current management plan with neurology. - Continue donepezil and memantine - Coordinate with neurologist Dr. Levert Feinstein for follow-up

## 2023-11-08 NOTE — Assessment & Plan Note (Signed)
 Chronic Blood pressure remains elevated with home readings around 157/70 mmHg and in-office readings of 130/74 mmHg and 180 mmHg. Currently on carvedilol 12.5 mg twice daily and hydralazine 10 mg three times daily. Discussed increasing hydralazine to 25 mg three times daily to better control blood pressure. Concern about blood pressure consistently above target range and importance of maintaining it under 150/90 mmHg was emphasized. Potential white coat hypertension was considered. - Increase hydralazine to 25 mg three times daily - Order basic metabolic panel to monitor potassium levels - Schedule virtual follow-up in two weeks to monitor blood pressure

## 2023-11-09 ENCOUNTER — Telehealth: Payer: Self-pay | Admitting: Family Medicine

## 2023-11-09 LAB — BMP8+EGFR
BUN/Creatinine Ratio: 19 (ref 12–28)
BUN: 21 mg/dL (ref 8–27)
CO2: 25 mmol/L (ref 20–29)
Calcium: 10.5 mg/dL — ABNORMAL HIGH (ref 8.7–10.3)
Chloride: 104 mmol/L (ref 96–106)
Creatinine, Ser: 1.11 mg/dL — ABNORMAL HIGH (ref 0.57–1.00)
Glucose: 84 mg/dL (ref 70–99)
Potassium: 4.7 mmol/L (ref 3.5–5.2)
Sodium: 141 mmol/L (ref 134–144)
eGFR: 50 mL/min/{1.73_m2} — ABNORMAL LOW (ref >59)

## 2023-11-09 NOTE — Telephone Encounter (Signed)
 Spoke with patient daughter to confirm medical records were finally sent from [facility]. She will be by here today to pick up copy for her records as well

## 2023-11-10 ENCOUNTER — Encounter: Payer: Self-pay | Admitting: Family Medicine

## 2023-11-17 ENCOUNTER — Telehealth: Payer: Self-pay | Admitting: Cardiology

## 2023-11-17 ENCOUNTER — Telehealth: Payer: Self-pay | Admitting: Neurology

## 2023-11-17 NOTE — Telephone Encounter (Signed)
 FYI-Pt's daughter called and cx everything for the pt, meds and appts, due to pt being in nursing home now. She states that the pt will now be getting treatment by the Nursing home provider.

## 2023-11-17 NOTE — Telephone Encounter (Signed)
 Pt daughter asked to speak with you about pt refills. She said she is in a nursing home and wants refills stopped.

## 2023-11-17 NOTE — Telephone Encounter (Signed)
 Called pt's daughter informing her that we do not send any medication into the pharmacy unless someone is requesting a refill. I advised the daughter to call the pharmacy also to let them know not to request a refill and to stop the automatic refill if pt is on this. I inform the daughter that if they have any other problems, questions or concerns, to give our office a call back. Daughter verbalized understanding.

## 2023-11-18 ENCOUNTER — Ambulatory Visit: Payer: Medicare PPO | Admitting: Physician Assistant

## 2023-12-06 ENCOUNTER — Other Ambulatory Visit (HOSPITAL_COMMUNITY): Payer: Self-pay | Admitting: Optometry

## 2023-12-06 DIAGNOSIS — H02422 Myogenic ptosis of left eyelid: Secondary | ICD-10-CM

## 2023-12-09 ENCOUNTER — Ambulatory Visit (HOSPITAL_COMMUNITY)
Admission: RE | Admit: 2023-12-09 | Discharge: 2023-12-09 | Disposition: A | Discharge status: Home / Self Care | Source: Ambulatory Visit | Attending: Optometry | Admitting: Optometry

## 2023-12-09 DIAGNOSIS — M6250 Muscle wasting and atrophy, not elsewhere classified, unspecified site: Secondary | ICD-10-CM | POA: Insufficient documentation

## 2023-12-09 DIAGNOSIS — M4802 Spinal stenosis, cervical region: Secondary | ICD-10-CM | POA: Insufficient documentation

## 2023-12-09 DIAGNOSIS — H02422 Myogenic ptosis of left eyelid: Secondary | ICD-10-CM | POA: Insufficient documentation

## 2023-12-09 DIAGNOSIS — H53469 Homonymous bilateral field defects, unspecified side: Secondary | ICD-10-CM | POA: Insufficient documentation

## 2023-12-09 DIAGNOSIS — Z9001 Acquired absence of eye: Secondary | ICD-10-CM | POA: Diagnosis not present

## 2023-12-09 MED ORDER — GADOBUTROL 1 MMOL/ML IV SOLN
4.0000 mL | Freq: Once | INTRAVENOUS | Status: AC | PRN
  Administered 2023-12-09: 4 mL via INTRAVENOUS

## 2023-12-13 ENCOUNTER — Telehealth: Admitting: Family Medicine

## 2024-01-30 ENCOUNTER — Ambulatory Visit: Payer: PPO | Admitting: Neurology

## 2024-01-30 ENCOUNTER — Ambulatory Visit: Admitting: Podiatry

## 2024-08-06 ENCOUNTER — Telehealth: Payer: Self-pay | Admitting: Neurology

## 2024-08-06 NOTE — Telephone Encounter (Signed)
 Pt's daughter states due to other medical concerns they will cancel this appointment for now.

## 2024-08-07 ENCOUNTER — Institutional Professional Consult (permissible substitution): Admitting: Neurology
